# Patient Record
Sex: Female | Born: 1935 | Race: White | Hispanic: No | Smoking: Never smoker
Health system: Southern US, Community
[De-identification: ages and names within clinical notes are randomized; demographics above are authoritative.]

## PROBLEM LIST (undated history)

## (undated) DIAGNOSIS — N390 Urinary tract infection, site not specified: Secondary | ICD-10-CM

## (undated) DIAGNOSIS — N189 Chronic kidney disease, unspecified: Secondary | ICD-10-CM

## (undated) DIAGNOSIS — I4891 Unspecified atrial fibrillation: Secondary | ICD-10-CM

## (undated) DIAGNOSIS — IMO0002 Reserved for concepts with insufficient information to code with codable children: Secondary | ICD-10-CM

## (undated) DIAGNOSIS — K219 Gastro-esophageal reflux disease without esophagitis: Secondary | ICD-10-CM

## (undated) DIAGNOSIS — B9620 Unspecified Escherichia coli [E. coli] as the cause of diseases classified elsewhere: Secondary | ICD-10-CM

## (undated) DIAGNOSIS — J449 Chronic obstructive pulmonary disease, unspecified: Secondary | ICD-10-CM

## (undated) DIAGNOSIS — I10 Essential (primary) hypertension: Secondary | ICD-10-CM

## (undated) HISTORY — PX: CHOLECYSTECTOMY: SHX55

## (undated) HISTORY — DX: Unspecified Escherichia coli (E. coli) as the cause of diseases classified elsewhere: B96.20

## (undated) HISTORY — DX: Essential (primary) hypertension: I10

## (undated) HISTORY — PX: NASAL SINUS SURGERY: SHX719

## (undated) HISTORY — DX: Chronic kidney disease, unspecified: N18.9

## (undated) HISTORY — DX: Urinary tract infection, site not specified: N39.0

---

## 1998-08-18 ENCOUNTER — Other Ambulatory Visit: Admission: RE | Admit: 1998-08-18 | Discharge: 1998-08-18 | Payer: Self-pay | Admitting: Geriatric Medicine

## 1998-12-04 ENCOUNTER — Encounter: Admission: RE | Admit: 1998-12-04 | Discharge: 1998-12-04 | Payer: Self-pay | Admitting: Geriatric Medicine

## 1998-12-04 ENCOUNTER — Encounter: Payer: Self-pay | Admitting: Geriatric Medicine

## 2002-03-11 ENCOUNTER — Other Ambulatory Visit: Admission: RE | Admit: 2002-03-11 | Discharge: 2002-03-11 | Payer: Self-pay | Admitting: Geriatric Medicine

## 2003-02-05 ENCOUNTER — Emergency Department (HOSPITAL_COMMUNITY): Admission: AD | Admit: 2003-02-05 | Discharge: 2003-02-05 | Payer: Self-pay | Admitting: Family Medicine

## 2003-05-07 ENCOUNTER — Ambulatory Visit (HOSPITAL_COMMUNITY): Admission: RE | Admit: 2003-05-07 | Discharge: 2003-05-07 | Payer: Self-pay | Admitting: Gastroenterology

## 2003-05-07 ENCOUNTER — Encounter (INDEPENDENT_AMBULATORY_CARE_PROVIDER_SITE_OTHER): Payer: Self-pay | Admitting: Specialist

## 2004-05-10 ENCOUNTER — Encounter: Admission: RE | Admit: 2004-05-10 | Discharge: 2004-05-10 | Payer: Self-pay | Admitting: Geriatric Medicine

## 2006-03-10 ENCOUNTER — Other Ambulatory Visit: Admission: RE | Admit: 2006-03-10 | Discharge: 2006-03-10 | Payer: Self-pay | Admitting: Geriatric Medicine

## 2008-02-05 ENCOUNTER — Encounter: Admission: RE | Admit: 2008-02-05 | Discharge: 2008-02-05 | Payer: Self-pay | Admitting: Geriatric Medicine

## 2008-06-02 ENCOUNTER — Encounter: Admission: RE | Admit: 2008-06-02 | Discharge: 2008-06-02 | Payer: Self-pay | Admitting: Urology

## 2008-07-11 ENCOUNTER — Encounter: Admission: RE | Admit: 2008-07-11 | Discharge: 2008-07-11 | Payer: Self-pay | Admitting: Neurosurgery

## 2008-12-09 ENCOUNTER — Inpatient Hospital Stay (HOSPITAL_COMMUNITY): Admission: EM | Admit: 2008-12-09 | Discharge: 2008-12-12 | Payer: Self-pay | Admitting: Emergency Medicine

## 2008-12-20 ENCOUNTER — Inpatient Hospital Stay (HOSPITAL_COMMUNITY): Admission: EM | Admit: 2008-12-20 | Discharge: 2008-12-29 | Payer: Self-pay | Admitting: Emergency Medicine

## 2009-01-11 ENCOUNTER — Emergency Department (HOSPITAL_COMMUNITY): Admission: EM | Admit: 2009-01-11 | Discharge: 2009-01-11 | Payer: Self-pay | Admitting: Emergency Medicine

## 2010-02-21 ENCOUNTER — Encounter: Payer: Self-pay | Admitting: Geriatric Medicine

## 2010-02-22 ENCOUNTER — Encounter: Payer: Self-pay | Admitting: Neurosurgery

## 2010-05-04 LAB — BASIC METABOLIC PANEL
BUN: 16 mg/dL (ref 6–23)
CO2: 26 mEq/L (ref 19–32)
Creatinine, Ser: 0.7 mg/dL (ref 0.4–1.2)
GFR calc non Af Amer: >60 mL/min (ref >60)
Glucose, Bld: 110 mg/dL — ABNORMAL HIGH (ref 70–99)
Sodium: 139 mEq/L (ref 135–145)

## 2010-05-04 LAB — DIFFERENTIAL
Basophils Absolute: 0 10*3/uL (ref 0.0–0.1)
Eosinophils Absolute: 0.9 10*3/uL — ABNORMAL HIGH (ref 0.0–0.7)
Eosinophils Relative: 11 % — ABNORMAL HIGH (ref 0–5)
Lymphocytes Relative: 27 % (ref 12–46)
Lymphs Abs: 2 10*3/uL (ref 0.7–4.0)
Monocytes Relative: 8 % (ref 3–12)
Neutro Abs: 4.2 10*3/uL (ref 1.7–7.7)
Neutrophils Relative %: 54 % (ref 43–77)

## 2010-05-04 LAB — CBC: MCV: 82.3 fL (ref 78.0–100.0)

## 2010-05-05 LAB — POCT I-STAT, CHEM 8
BUN: 13 mg/dL (ref 6–23)
Chloride: 104 mEq/L (ref 96–112)
Creatinine, Ser: 0.7 mg/dL (ref 0.4–1.2)
Glucose, Bld: 146 mg/dL — ABNORMAL HIGH (ref 70–99)
Potassium: 3.6 mEq/L (ref 3.5–5.1)
Sodium: 141 mEq/L (ref 135–145)

## 2010-05-05 LAB — URINE MICROSCOPIC-ADD ON

## 2010-05-05 LAB — DIFFERENTIAL
Basophils Absolute: 0 10*3/uL (ref 0.0–0.1)
Basophils Absolute: 0 10*3/uL (ref 0.0–0.1)
Basophils Relative: 0 % (ref 0–1)
Basophils Relative: 0 % (ref 0–1)
Eosinophils Absolute: 0.2 10*3/uL (ref 0.0–0.7)
Eosinophils Relative: 3 % (ref 0–5)
Eosinophils Relative: 2 % (ref 0–5)
Lymphocytes Relative: 16 % (ref 12–46)
Monocytes Absolute: 1 10*3/uL (ref 0.1–1.0)
Neutro Abs: 8.4 10*3/uL — ABNORMAL HIGH (ref 1.7–7.7)
Neutrophils Relative %: 78 % — ABNORMAL HIGH (ref 43–77)

## 2010-05-05 LAB — CBC
HCT: 29.8 % — ABNORMAL LOW (ref 36.0–46.0)
HCT: 31.7 % — ABNORMAL LOW (ref 36.0–46.0)
HCT: 36.5 % (ref 36.0–46.0)
HCT: 32.6 % — ABNORMAL LOW (ref 36.0–46.0)
Hemoglobin: 10 g/dL — ABNORMAL LOW (ref 12.0–15.0)
Hemoglobin: 11.4 g/dL — ABNORMAL LOW (ref 12.0–15.0)
Hemoglobin: 11.3 g/dL — ABNORMAL LOW (ref 12.0–15.0)
Hemoglobin: 12.4 g/dL (ref 12.0–15.0)
MCHC: 33.8 g/dL (ref 30.0–36.0)
MCHC: 33.8 g/dL (ref 30.0–36.0)
MCHC: 34.5 g/dL (ref 30.0–36.0)
MCHC: 34.3 g/dL (ref 30.0–36.0)
MCV: 83.7 fL (ref 78.0–100.0)
Platelets: 475 10*3/uL — ABNORMAL HIGH (ref 150–400)
Platelets: 493 10*3/uL — ABNORMAL HIGH (ref 150–400)
Platelets: 555 10*3/uL — ABNORMAL HIGH (ref 150–400)
Platelets: 557 10*3/uL — ABNORMAL HIGH (ref 150–400)
RBC: 4.07 MIL/uL (ref 3.87–5.11)
RBC: 4.28 MIL/uL (ref 3.87–5.11)
RDW: 14 % (ref 11.5–15.5)
RDW: 14.4 % (ref 11.5–15.5)
RDW: 13.9 % (ref 11.5–15.5)
RDW: 14.2 % (ref 11.5–15.5)
WBC: 10.8 10*3/uL — ABNORMAL HIGH (ref 4.0–10.5)
WBC: 7.1 10*3/uL (ref 4.0–10.5)
WBC: 7.3 10*3/uL (ref 4.0–10.5)
WBC: 7.4 10*3/uL (ref 4.0–10.5)
WBC: 8.4 10*3/uL (ref 4.0–10.5)

## 2010-05-05 LAB — URINE CULTURE
Colony Count: 60000
Colony Count: NO GROWTH
Colony Count: >=100000
Culture: NO GROWTH

## 2010-05-05 LAB — HEPARIN LEVEL (UNFRACTIONATED)
Heparin Unfractionated: 0.28 IU/mL — ABNORMAL LOW (ref 0.30–0.70)
Heparin Unfractionated: 0.33 IU/mL (ref 0.30–0.70)
Heparin Unfractionated: 0.36 IU/mL (ref 0.30–0.70)
Heparin Unfractionated: 0.39 IU/mL (ref 0.30–0.70)
Heparin Unfractionated: 0.74 IU/mL — ABNORMAL HIGH (ref 0.30–0.70)

## 2010-05-05 LAB — COMPREHENSIVE METABOLIC PANEL
AST: 13 U/L (ref 0–37)
Albumin: 2.5 g/dL — ABNORMAL LOW (ref 3.5–5.2)
Alkaline Phosphatase: 67 U/L (ref 39–117)
BUN: 14 mg/dL (ref 6–23)
CO2: 27 mEq/L (ref 19–32)
Chloride: 100 mEq/L (ref 96–112)
GFR calc Af Amer: >60 mL/min (ref >60)
GFR calc non Af Amer: >60 mL/min (ref >60)
Potassium: 3.3 mEq/L — ABNORMAL LOW (ref 3.5–5.1)
Total Bilirubin: 0.7 mg/dL (ref 0.3–1.2)

## 2010-05-05 LAB — CK TOTAL AND CKMB (NOT AT ARMC)
CK, MB: 0.8 ng/mL (ref 0.3–4.0)
CK, MB: 0.9 ng/mL (ref 0.3–4.0)
Relative Index: INVALID (ref 0.0–2.5)
Relative Index: INVALID (ref 0.0–2.5)
Total CK: 40 U/L (ref 7–177)

## 2010-05-05 LAB — BASIC METABOLIC PANEL
BUN: 5 mg/dL — ABNORMAL LOW (ref 6–23)
BUN: 7 mg/dL (ref 6–23)
BUN: 8 mg/dL (ref 6–23)
CO2: 30 mEq/L (ref 19–32)
CO2: 30 mEq/L (ref 19–32)
Calcium: 8.4 mg/dL (ref 8.4–10.5)
Calcium: 8.4 mg/dL (ref 8.4–10.5)
Creatinine, Ser: 0.51 mg/dL (ref 0.4–1.2)
Creatinine, Ser: 0.67 mg/dL (ref 0.4–1.2)
Creatinine, Ser: 0.71 mg/dL (ref 0.4–1.2)
Creatinine, Ser: 0.75 mg/dL (ref 0.4–1.2)
GFR calc Af Amer: >60 mL/min (ref >60)
GFR calc Af Amer: >60 mL/min (ref >60)
GFR calc Af Amer: >60 mL/min (ref >60)
GFR calc non Af Amer: >60 mL/min (ref >60)
GFR calc non Af Amer: >60 mL/min (ref >60)
GFR calc non Af Amer: >60 mL/min (ref >60)
GFR calc non Af Amer: >60 mL/min (ref >60)
Glucose, Bld: 116 mg/dL — ABNORMAL HIGH (ref 70–99)
Glucose, Bld: 133 mg/dL — ABNORMAL HIGH (ref 70–99)
Potassium: 3.1 mEq/L — ABNORMAL LOW (ref 3.5–5.1)
Potassium: 3.8 mEq/L (ref 3.5–5.1)
Sodium: 143 mEq/L (ref 135–145)
Sodium: 137 mEq/L (ref 135–145)

## 2010-05-05 LAB — URINALYSIS, ROUTINE W REFLEX MICROSCOPIC
Bilirubin Urine: NEGATIVE
Bilirubin Urine: NEGATIVE
Glucose, UA: NEGATIVE mg/dL
Hgb urine dipstick: NEGATIVE
Ketones, ur: NEGATIVE mg/dL
Nitrite: NEGATIVE
Nitrite: NEGATIVE
Protein, ur: 30 mg/dL — AB
Specific Gravity, Urine: 1.017 (ref 1.005–1.030)
Specific Gravity, Urine: 1.026 (ref 1.005–1.030)
Urobilinogen, UA: 0.2 mg/dL (ref 0.0–1.0)
pH: 5.5 (ref 5.0–8.0)
pH: 5.5 (ref 5.0–8.0)

## 2010-05-05 LAB — IRON AND TIBC
Iron: 48 ug/dL (ref 42–135)
TIBC: 267 ug/dL (ref 250–470)
UIBC: 219 ug/dL

## 2010-05-05 LAB — PROTIME-INR
INR: 1.22 (ref 0.00–1.49)
INR: 1.27 (ref 0.00–1.49)
Prothrombin Time: 15.3 seconds — ABNORMAL HIGH (ref 11.6–15.2)
Prothrombin Time: 15.8 seconds — ABNORMAL HIGH (ref 11.6–15.2)
Prothrombin Time: 14.6 seconds (ref 11.6–15.2)

## 2010-05-05 LAB — CULTURE, BLOOD (ROUTINE X 2)

## 2010-05-05 LAB — FERRITIN: Ferritin: 228 ng/mL (ref 10–291)

## 2010-05-05 LAB — TSH: TSH: 0.722 u[IU]/mL (ref 0.350–4.500)

## 2010-05-05 LAB — FOLATE RBC: RBC Folate: 757 ng/mL — ABNORMAL HIGH (ref 180–600)

## 2010-05-05 LAB — GLUCOSE, CAPILLARY: Glucose-Capillary: 117 mg/dL — ABNORMAL HIGH (ref 70–99)

## 2010-05-05 LAB — TROPONIN I: Troponin I: 0.01 ng/mL (ref 0.00–0.06)

## 2010-06-18 NOTE — Op Note (Signed)
NAME:  [name] [name], [name] [name]                          ACCOUNT NO.:  [id number]   MEDICAL RECORD NO.:  [id number]                   PATIENT TYPE:  AMB   LOCATION:  ENDO                                 FACILITY:  [facility]   PHYSICIAN:  Danise Edge, M.D.                DATE OF BIRTH:  09/23/1926   DATE OF PROCEDURE:  05/07/2003  DATE OF DISCHARGE:                                 OPERATIVE REPORT   PROCEDURE:  Esophagogastroduodenoscopy and colonoscopy.   INDICATIONS FOR PROCEDURE:  [name] [name] is a 77 year old female with  chronic gastroesophageal reflux disease.  She takes nonsteroidal  antiinflammatory medication.  She has nausea, vomiting and unexplained  diarrhea. She has a history of colon polyps.   ENDOSCOPIST:  Danise Edge, M.D.   PREMEDICATION:  Versed 5 mg, Demerol 50 mg.   PROCEDURE:  Esophagogastroduodenoscopy.   DESCRIPTION OF PROCEDURE:  After obtaining informed consent, [name] [name] was  placed in the left lateral decubitus position. I administered intravenous  Demerol and intravenous Versed to achieve conscious sedation for the  procedure. The patient's blood pressure, oxygen saturation and cardiac  rhythm were monitored throughout the procedure and documented in the medical  record.   The Olympus gastroscope was passed through the posterior hypopharynx into  the proximal esophagus without difficulty. The hypopharynx, larynx and vocal  cords appeared normal.   ESOPHAGOSCOPY:  The proximal and mid segments of the esophagus appeared  normal. There is mucosal scarring in the distal esophagus associated with  multiple linear erosions.   GASTROSCOPY:  There is a small hiatal hernia.  Retroflexed view of the  gastric cardia and fundus was normal. The diaphragmatic hiatus is patulous.  The gastric body, antrum and pylorus appear normal.   DUODENOSCOPY:  The duodenal bulb and mid duodenum appear normal.   ASSESSMENT:  Chronic gastroesophageal reflux disease associated  with  erosions in the distal esophagus and distal esophageal mucosa scarring.   PROCEDURE:  Proctocolonoscopy to the cecum.   DESCRIPTION OF PROCEDURE:  Anal inspection was normal. Digital rectal exam  was normal. The Olympus adjustable pediatric colonoscope was introduced into  the rectum and advanced to the cecum. Colonic preparation for the exam today  was fair at best. [name] [name] does have some difficulty in holding air making  colonic insufflation somewhat difficult.   RECTUM:  Normal.   SIGMOID COLON AND DESCENDING COLON:  Normal.   SPLENIC FLEXURE:  Normal.   TRANSVERSE COLON:  Normal.   HEPATIC FLEXURE:  Normal.   ASCENDING COLON:  Normal.   CECUM AND ILEOCECAL VALVE:  Normal.   RANDOM COLON BIOPSY:  Three biopsies were taken from the right colon and two  biopsies were taken from the left colon to rule out microscopic-collagenous  colitis.  Danise Edge, M.D.    MJ/MEDQ  D:  05/07/2003  T:  05/07/2003  Job:  [id number]

## 2010-08-13 ENCOUNTER — Other Ambulatory Visit: Payer: Self-pay | Admitting: Geriatric Medicine

## 2010-08-13 DIAGNOSIS — R109 Unspecified abdominal pain: Secondary | ICD-10-CM

## 2010-08-17 ENCOUNTER — Ambulatory Visit
Admission: RE | Admit: 2010-08-17 | Discharge: 2010-08-17 | Disposition: A | Discharge status: Home / Self Care | Payer: Medicare Other | Source: Ambulatory Visit | Attending: Geriatric Medicine | Admitting: Geriatric Medicine

## 2010-08-17 DIAGNOSIS — R109 Unspecified abdominal pain: Secondary | ICD-10-CM

## 2010-09-17 ENCOUNTER — Emergency Department (HOSPITAL_COMMUNITY): Payer: Medicare Other

## 2010-09-17 ENCOUNTER — Encounter (HOSPITAL_COMMUNITY): Payer: Self-pay | Admitting: Radiology

## 2010-09-17 ENCOUNTER — Inpatient Hospital Stay (HOSPITAL_COMMUNITY)
Admission: EM | Admit: 2010-09-17 | Discharge: 2010-09-24 | DRG: 417 | Disposition: A | Discharge status: Home Health | Payer: Medicare Other | Attending: Family Medicine | Admitting: Family Medicine

## 2010-09-17 DIAGNOSIS — K859 Acute pancreatitis without necrosis or infection, unspecified: Principal | ICD-10-CM | POA: Diagnosis present

## 2010-09-17 DIAGNOSIS — Z79899 Other long term (current) drug therapy: Secondary | ICD-10-CM

## 2010-09-17 DIAGNOSIS — K8051 Calculus of bile duct without cholangitis or cholecystitis with obstruction: Secondary | ICD-10-CM | POA: Diagnosis present

## 2010-09-17 DIAGNOSIS — E876 Hypokalemia: Secondary | ICD-10-CM | POA: Diagnosis present

## 2010-09-17 DIAGNOSIS — J690 Pneumonitis due to inhalation of food and vomit: Secondary | ICD-10-CM | POA: Diagnosis not present

## 2010-09-17 DIAGNOSIS — K838 Other specified diseases of biliary tract: Secondary | ICD-10-CM | POA: Diagnosis present

## 2010-09-17 DIAGNOSIS — J441 Chronic obstructive pulmonary disease with (acute) exacerbation: Secondary | ICD-10-CM | POA: Diagnosis present

## 2010-09-17 DIAGNOSIS — J9600 Acute respiratory failure, unspecified whether with hypoxia or hypercapnia: Secondary | ICD-10-CM | POA: Diagnosis not present

## 2010-09-17 DIAGNOSIS — M8448XA Pathological fracture, other site, initial encounter for fracture: Secondary | ICD-10-CM | POA: Diagnosis present

## 2010-09-17 DIAGNOSIS — K219 Gastro-esophageal reflux disease without esophagitis: Secondary | ICD-10-CM | POA: Diagnosis present

## 2010-09-17 DIAGNOSIS — N390 Urinary tract infection, site not specified: Secondary | ICD-10-CM | POA: Diagnosis present

## 2010-09-17 DIAGNOSIS — R5381 Other malaise: Secondary | ICD-10-CM | POA: Diagnosis present

## 2010-09-17 DIAGNOSIS — F0390 Unspecified dementia without behavioral disturbance: Secondary | ICD-10-CM | POA: Diagnosis present

## 2010-09-17 DIAGNOSIS — I4891 Unspecified atrial fibrillation: Secondary | ICD-10-CM | POA: Diagnosis not present

## 2010-09-17 DIAGNOSIS — Z86711 Personal history of pulmonary embolism: Secondary | ICD-10-CM

## 2010-09-17 LAB — DIFFERENTIAL
Lymphocytes Relative: 8 % — ABNORMAL LOW (ref 12–46)
Lymphs Abs: 0.9 10*3/uL (ref 0.7–4.0)
Monocytes Relative: 6 % (ref 3–12)
Neutrophils Relative %: 86 % — ABNORMAL HIGH (ref 43–77)

## 2010-09-17 LAB — URINALYSIS, ROUTINE W REFLEX MICROSCOPIC
Ketones, ur: 15 mg/dL — AB
Specific Gravity, Urine: 1.026 (ref 1.005–1.030)
Urobilinogen, UA: 2 mg/dL — ABNORMAL HIGH (ref 0.0–1.0)

## 2010-09-17 LAB — CBC
HCT: 39.8 % (ref 36.0–46.0)
MCH: 28.3 pg (ref 26.0–34.0)
MCV: 81.6 fL (ref 78.0–100.0)
RBC: 4.88 MIL/uL (ref 3.87–5.11)
WBC: 12.5 10*3/uL — ABNORMAL HIGH (ref 4.0–10.5)

## 2010-09-17 LAB — POCT I-STAT TROPONIN I: Troponin i, poc: 0 ng/mL (ref 0.00–0.08)

## 2010-09-17 LAB — HEPATIC FUNCTION PANEL
ALT: 179 U/L — ABNORMAL HIGH (ref 0–35)
AST: 203 U/L — ABNORMAL HIGH (ref 0–37)
Alkaline Phosphatase: 215 U/L — ABNORMAL HIGH (ref 39–117)
Bilirubin, Direct: 1.7 mg/dL — ABNORMAL HIGH (ref 0.0–0.3)
Indirect Bilirubin: 0.8 mg/dL (ref 0.3–0.9)
Total Bilirubin: 2.5 mg/dL — ABNORMAL HIGH (ref 0.3–1.2)

## 2010-09-17 LAB — POCT I-STAT, CHEM 8
Calcium, Ion: 1.06 mmol/L — ABNORMAL LOW (ref 1.12–1.32)
Creatinine, Ser: 0.8 mg/dL (ref 0.50–1.10)
Glucose, Bld: 168 mg/dL — ABNORMAL HIGH (ref 70–99)
Hemoglobin: 13.9 g/dL (ref 12.0–15.0)
TCO2: 32 mmol/L (ref 0–100)

## 2010-09-17 LAB — URINE MICROSCOPIC-ADD ON

## 2010-09-17 MED ORDER — IOHEXOL 300 MG/ML  SOLN
80.0000 mL | Freq: Once | INTRAMUSCULAR | Status: AC | PRN
  Administered 2010-09-17: 80 mL via INTRAVENOUS

## 2010-09-18 DIAGNOSIS — K859 Acute pancreatitis without necrosis or infection, unspecified: Secondary | ICD-10-CM

## 2010-09-18 DIAGNOSIS — K8020 Calculus of gallbladder without cholecystitis without obstruction: Secondary | ICD-10-CM

## 2010-09-18 LAB — GLUCOSE, CAPILLARY
Glucose-Capillary: 105 mg/dL — ABNORMAL HIGH (ref 70–99)
Glucose-Capillary: 106 mg/dL — ABNORMAL HIGH (ref 70–99)
Glucose-Capillary: 140 mg/dL — ABNORMAL HIGH (ref 70–99)

## 2010-09-18 LAB — DIFFERENTIAL
Basophils Absolute: 0 10*3/uL (ref 0.0–0.1)
Basophils Relative: 0 % (ref 0–1)
Monocytes Relative: 8 % (ref 3–12)
Neutro Abs: 6.2 10*3/uL (ref 1.7–7.7)
Neutrophils Relative %: 73 % (ref 43–77)

## 2010-09-18 LAB — TSH: TSH: 2.983 u[IU]/mL (ref 0.350–4.500)

## 2010-09-18 LAB — BASIC METABOLIC PANEL
CO2: 30 mEq/L (ref 19–32)
Calcium: 8.5 mg/dL (ref 8.4–10.5)
Creatinine, Ser: 0.62 mg/dL (ref 0.50–1.10)
Glucose, Bld: 103 mg/dL — ABNORMAL HIGH (ref 70–99)

## 2010-09-18 LAB — HEPATIC FUNCTION PANEL
ALT: 161 U/L — ABNORMAL HIGH (ref 0–35)
Albumin: 2.5 g/dL — ABNORMAL LOW (ref 3.5–5.2)
Alkaline Phosphatase: 198 U/L — ABNORMAL HIGH (ref 39–117)
Total Protein: 6.4 g/dL (ref 6.0–8.3)

## 2010-09-18 LAB — CK TOTAL AND CKMB (NOT AT ARMC): Total CK: 44 U/L (ref 7–177)

## 2010-09-18 LAB — PROTIME-INR: Prothrombin Time: 13.6 seconds (ref 11.6–15.2)

## 2010-09-18 LAB — CARDIAC PANEL(CRET KIN+CKTOT+MB+TROPI)
Relative Index: INVALID (ref 0.0–2.5)
Total CK: 44 U/L (ref 7–177)
Troponin I: <0.3 ng/mL (ref <0.30)

## 2010-09-18 LAB — CBC
Hemoglobin: 12 g/dL (ref 12.0–15.0)
RBC: 4.28 MIL/uL (ref 3.87–5.11)

## 2010-09-19 ENCOUNTER — Other Ambulatory Visit (INDEPENDENT_AMBULATORY_CARE_PROVIDER_SITE_OTHER): Payer: Self-pay | Admitting: Surgery

## 2010-09-19 ENCOUNTER — Inpatient Hospital Stay (HOSPITAL_COMMUNITY): Payer: Medicare Other

## 2010-09-19 DIAGNOSIS — K812 Acute cholecystitis with chronic cholecystitis: Secondary | ICD-10-CM

## 2010-09-19 LAB — SURGICAL PCR SCREEN
MRSA, PCR: NEGATIVE
Staphylococcus aureus: NEGATIVE

## 2010-09-19 LAB — CBC
MCH: 28.2 pg (ref 26.0–34.0)
MCV: 82.5 fL (ref 78.0–100.0)
Platelets: 307 10*3/uL (ref 150–400)
RDW: 13.9 % (ref 11.5–15.5)
WBC: 9.2 10*3/uL (ref 4.0–10.5)

## 2010-09-19 LAB — COMPREHENSIVE METABOLIC PANEL
AST: 156 U/L — ABNORMAL HIGH (ref 0–37)
Albumin: 2.9 g/dL — ABNORMAL LOW (ref 3.5–5.2)
Calcium: 8.6 mg/dL (ref 8.4–10.5)
Creatinine, Ser: 0.65 mg/dL (ref 0.50–1.10)

## 2010-09-19 LAB — GLUCOSE, CAPILLARY: Glucose-Capillary: 99 mg/dL (ref 70–99)

## 2010-09-19 LAB — CARDIAC PANEL(CRET KIN+CKTOT+MB+TROPI)
CK, MB: 2.6 ng/mL (ref 0.3–4.0)
Total CK: 82 U/L (ref 7–177)

## 2010-09-19 LAB — POTASSIUM: Potassium: 3.1 mEq/L — ABNORMAL LOW (ref 3.5–5.1)

## 2010-09-19 NOTE — Op Note (Signed)
NAMEREHAM, SLABAUGH                ACCOUNT NO.:  [id number]  MEDICAL RECORD NO.:  [id number]  LOCATION:                                 FACILITY:  PHYSICIAN:  Abigail Miyamoto, M.D. DATE OF BIRTH:  1926-12-11  DATE OF PROCEDURE:  09/19/2010 DATE OF DISCHARGE:                              OPERATIVE REPORT   PREOPERATIVE DIAGNOSIS:  Gallstone pancreatitis.  POSTOPERATIVE DIAGNOSIS:  Gallstone pancreatitis with choledocholithiasis.  PROCEDURE:  Laparoscopic cholecystectomy with intraoperative cholangiogram.  SURGEON:  Abigail Miyamoto, MD  ANESTHESIA:  General and 0.25% Marcaine.  ESTIMATED BLOOD LOSS:  Minimal.  FINDINGS:  The patient had a gallbladder full of gallstones. Cholangiogram showed a dilated bile duct with obstructing common bile duct stone at the ampulla.  PROCEDURE IN DETAIL:  The patient was brought to the operating room, identified as [name] [name].  She was placed supine on the operating room table, and general anesthesia was induced.  Her abdomen was then prepped and draped in the usual sterile fashion.  Using a 15 blade, a small vertical incision was made below the umbilicus.  This was carried down the fascia which was then opened with a scalpel.  A hemostat was then used to pass the peritoneal cavity under direct vision.  A 0 Vicryl pursestring suture was then placed around the fascial opening.  The Hasson port was placed in the opening and insufflation of the abdomen was begun.  A 5-mm port was then placed in the patient's epigastrium and two more were placed in the patient's in the right upper quadrant under direct vision.  The gallbladder was found to be distended.  It was aspirated of bile prior to retracting above the liver.  The cystic duct was easily dissected out and critical window was achieved around it.  It was then clipped once distally and opened with laparoscopic scissors. The cholangiocatheter was inserted in the right upper quadrant  under direct vision, was placed into the opening of the cystic duct.  A cholangiogram was then performed with contrast.  This delineated the dilated bile duct and demonstrated obstructing stone at the distal common bile duct.  At this point, the cholangiocatheter was removed and clipped the cyst duct three times proximally and transected it.  The cystic artery and posterior branch were then identified, clipped proximally and distally and transected as well.  The gallbladder was then slowly dissected free from the liver bed with electrocautery.  Once this was freed from the liver bed was placed in an endosac and removed through the incision at the umbilicus as well as several stones.  I then thoroughly irrigated the abdomen with normal saline.  I placed a piece of fibrillar in the gallbladder bed, hemostasis appeared to be achieved. After irrigating the abdomen further, all ports were removed under direct vision and the abdomen was deflated, hemostasis appeared to be achieved.  A 0 Vicryl at the umbilicus was tied in place closing the fascial defect.  All port sites were then anesthetized with Marcaine and closed with 4-0 Monocryl subcuticular sutures. Steri-Strips and Band-Aids were then applied.  The patient tolerated the procedure well.  All counts were correct at the end of procedure.  The patient was then extubated in operating room and taken in stable condition to recovery room.     Abigail Miyamoto, M.D.     DB/MEDQ  D:  09/19/2010  T:  09/19/2010  Job:  [id number]  Electronically Signed by Abigail Miyamoto M.D. on 09/19/2010 05:41:14 PM

## 2010-09-19 NOTE — Consult Note (Signed)
[name] [name], [name] [name]                ACCOUNT NO.:  [id number]  MEDICAL RECORD NO.:  [id number]  LOCATION:  [facility]                         FACILITY:  [facility]  PHYSICIAN:  Abigail Miyamoto, M.D. DATE OF BIRTH:  [birth year]  DATE OF CONSULTATION:  09/18/2010 DATE OF DISCHARGE:                                CONSULTATION   REFERRING PHYSICIAN:  Eduard Clos, MD  CHIEF COMPLAINT:  Epigastric abdominal pain and gallstone pancreatitis.  HISTORY:  This is an 77 year old female who is being admitted by the Hospitalist Service.  She has had 1-day history of epigastric abdominal pain, nausea, and vomiting.  She was found to have elevated lipase, liver function test and a CAT scan showing findings consistent with a dilated bile duct and gallstones consistent with gallstone pancreatitis. Surgery has been consulted regarding this.  Currently, the patient reports her pain has decreased significantly.  She has had no previous history of gallbladder attacks.  The pain was described as sharp and moderate to severe at times.  PAST MEDICAL HISTORY: 1. COPD. 2. Dementia. 3. Multiple falls. 4. She has had a pulmonary embolism and now an IVC filter.  She denies any previous surgical history.  There is no family present to question her further. Some of the history is obtained by old records.  Her other past medical history includes previous history of urinary tract infections, depression, and known ovarian masses.  MEDICATIONS:  Please see orders for universal medical reconciliation form.  ALLERGIES:  No known drug allergies.  SOCIAL HISTORY:  She does not smoke, does not drink alcohol.  FAMILY HISTORY:  Significant for coronary artery disease and kidney failure.  REVIEW OF SYSTEMS:  Currently is negative for fevers or chills, is also negative for cough or difficulty breathing, is negative for chest pain or arrhythmias.  Rest of review of systems is unremarkable except for dementia,  chronic urinary tract infections.  PHYSICAL EXAMINATION:  GENERAL:  This is an elderly-appearing female in no acute distress.  She is awake, alert and oriented and comfortable. She is afebrile. VITAL SIGNS:  Stable. HEENT:  Eyes:  Anicteric.  Pupils reactive bilaterally.  ENT:  External ears and nose are normal.  Hearing is normal.  Oropharynx clear. NECK:  Supple.  Trachea midline.  There is no thyromegaly.  There is no JVD. LUNGS:  Clear to auscultation bilaterally with normal respiratory effort. CARDIOVASCULAR:  Regular rate and rhythm.  There are no murmurs.  There is no peripheral edema. ABDOMEN:  Currently is soft, nontender, nondistended.  There is very little guarding in the epigastrium.  There are no hernias.  There is no organomegaly. EXTREMITIES:  Warm and well perfused, no edema, clubbing, or cyanosis. Peripheral pulses are intact in all extremities.  Glasgow coma scale is 15. SKIN:  No rashes and no jaundice.  DATA REVIEWED:  The patient had a CAT scan of the abdomen and pelvis which shows her to have gallstones and distention of gallbladder.  The bile ducts are mildly dilated to 11 mm.  There is also some very mild intrahepatic biliary dilatation.  There are adnexal masses consistent with her known bilateral ovarian cysts.  She also has hepatic cysts. The  patient's white blood count is 12.5, hemoglobin is 13.8, bilirubin is 2.5, alkaline phosphatase 215, lipase is 265.  IMPRESSION:  This is an elderly female with gallstone pancreatitis and multiple medical problems.  At this point, she is being admitted to the hospital by the Hospitalist Service for further workup, IV antibiotics and IV fluid.  I suggest Gastroenterology to be consulted as the patient may need an ERCP if her liver function tests continue to increase.  If they decrease, she will be considered for a possible laparoscopic cholecystectomy, pending cardiopulmonary clearance.     Abigail Miyamoto,  M.D.     DB/MEDQ  D:  09/18/2010  T:  09/18/2010  Job:  [id number]  Electronically Signed by Abigail Miyamoto M.D. on 09/19/2010 05:41:11 PM

## 2010-09-20 ENCOUNTER — Inpatient Hospital Stay (HOSPITAL_COMMUNITY): Payer: Medicare Other

## 2010-09-20 DIAGNOSIS — K819 Cholecystitis, unspecified: Secondary | ICD-10-CM

## 2010-09-20 DIAGNOSIS — R0602 Shortness of breath: Secondary | ICD-10-CM

## 2010-09-20 DIAGNOSIS — I4891 Unspecified atrial fibrillation: Secondary | ICD-10-CM

## 2010-09-20 LAB — COMPREHENSIVE METABOLIC PANEL
Albumin: 2.2 g/dL — ABNORMAL LOW (ref 3.5–5.2)
Alkaline Phosphatase: 202 U/L — ABNORMAL HIGH (ref 39–117)
BUN: 12 mg/dL (ref 6–23)
Chloride: 103 mEq/L (ref 96–112)
Creatinine, Ser: 0.47 mg/dL — ABNORMAL LOW (ref 0.50–1.10)
GFR calc Af Amer: >60 mL/min (ref >60)
Glucose, Bld: 151 mg/dL — ABNORMAL HIGH (ref 70–99)
Potassium: 3.3 mEq/L — ABNORMAL LOW (ref 3.5–5.1)
Total Bilirubin: 4.9 mg/dL — ABNORMAL HIGH (ref 0.3–1.2)
Total Protein: 5.9 g/dL — ABNORMAL LOW (ref 6.0–8.3)

## 2010-09-20 LAB — URINE CULTURE: Colony Count: NO GROWTH

## 2010-09-20 LAB — AMYLASE: Amylase: 67 U/L (ref 0–105)

## 2010-09-20 LAB — URINALYSIS, ROUTINE W REFLEX MICROSCOPIC
Glucose, UA: NEGATIVE mg/dL
Nitrite: NEGATIVE
Specific Gravity, Urine: 1.023 (ref 1.005–1.030)
pH: 5.5 (ref 5.0–8.0)

## 2010-09-20 LAB — URINE MICROSCOPIC-ADD ON

## 2010-09-20 LAB — LIPASE, BLOOD: Lipase: 11 U/L (ref 11–59)

## 2010-09-20 LAB — BLOOD GAS, ARTERIAL
Bicarbonate: 28.3 mEq/L — ABNORMAL HIGH (ref 20.0–24.0)
Drawn by: 332341
O2 Content: 2 L/min
pCO2 arterial: 47.2 mmHg — ABNORMAL HIGH (ref 35.0–45.0)
pO2, Arterial: 50.8 mmHg — ABNORMAL LOW (ref 80.0–100.0)

## 2010-09-20 LAB — CBC
MCHC: 32.9 g/dL (ref 30.0–36.0)
Platelets: 298 10*3/uL (ref 150–400)
RDW: 14.3 % (ref 11.5–15.5)
WBC: 10.1 10*3/uL (ref 4.0–10.5)

## 2010-09-20 LAB — CARDIAC PANEL(CRET KIN+CKTOT+MB+TROPI)
CK, MB: 2.5 ng/mL (ref 0.3–4.0)
CK, MB: 2.9 ng/mL (ref 0.3–4.0)
Relative Index: INVALID (ref 0.0–2.5)
Total CK: 90 U/L (ref 7–177)
Troponin I: <0.3 ng/mL (ref <0.30)

## 2010-09-20 LAB — BASIC METABOLIC PANEL
CO2: 28 mEq/L (ref 19–32)
Chloride: 103 mEq/L (ref 96–112)
Creatinine, Ser: <0.47 mg/dL — ABNORMAL LOW (ref 0.50–1.10)
Glucose, Bld: 157 mg/dL — ABNORMAL HIGH (ref 70–99)
Sodium: 138 mEq/L (ref 135–145)

## 2010-09-20 LAB — TYPE AND SCREEN
ABO/RH(D): A POS
Antibody Screen: NEGATIVE

## 2010-09-20 LAB — MAGNESIUM: Magnesium: 1.9 mg/dL (ref 1.5–2.5)

## 2010-09-20 LAB — POTASSIUM: Potassium: 3.6 mEq/L (ref 3.5–5.1)

## 2010-09-21 ENCOUNTER — Inpatient Hospital Stay (HOSPITAL_COMMUNITY): Payer: Medicare Other

## 2010-09-21 DIAGNOSIS — R0602 Shortness of breath: Secondary | ICD-10-CM

## 2010-09-21 DIAGNOSIS — I4891 Unspecified atrial fibrillation: Secondary | ICD-10-CM

## 2010-09-21 DIAGNOSIS — K819 Cholecystitis, unspecified: Secondary | ICD-10-CM

## 2010-09-21 LAB — CBC
HCT: 33.3 % — ABNORMAL LOW (ref 36.0–46.0)
Hemoglobin: 11.1 g/dL — ABNORMAL LOW (ref 12.0–15.0)
MCH: 27.5 pg (ref 26.0–34.0)
MCHC: 33.3 g/dL (ref 30.0–36.0)
MCV: 82.6 fL (ref 78.0–100.0)

## 2010-09-21 LAB — POCT I-STAT 3, ART BLOOD GAS (G3+)
Acid-Base Excess: 2 mmol/L (ref 0.0–2.0)
Bicarbonate: 27.5 mEq/L — ABNORMAL HIGH (ref 20.0–24.0)
O2 Saturation: 95 %
TCO2: 29 mmol/L (ref 0–100)
pO2, Arterial: 73 mmHg — ABNORMAL LOW (ref 80.0–100.0)

## 2010-09-21 LAB — COMPREHENSIVE METABOLIC PANEL
Alkaline Phosphatase: 206 U/L — ABNORMAL HIGH (ref 39–117)
BUN: 14 mg/dL (ref 6–23)
Creatinine, Ser: 0.61 mg/dL (ref 0.50–1.10)
GFR calc Af Amer: >60 mL/min (ref >60)
Glucose, Bld: 159 mg/dL — ABNORMAL HIGH (ref 70–99)
Potassium: 3.5 mEq/L (ref 3.5–5.1)
Total Protein: 5.8 g/dL — ABNORMAL LOW (ref 6.0–8.3)

## 2010-09-21 LAB — GLUCOSE, CAPILLARY: Glucose-Capillary: 203 mg/dL — ABNORMAL HIGH (ref 70–99)

## 2010-09-22 ENCOUNTER — Inpatient Hospital Stay (HOSPITAL_COMMUNITY): Payer: Medicare Other

## 2010-09-22 LAB — POCT I-STAT 3, ART BLOOD GAS (G3+)
Bicarbonate: 27.2 mEq/L — ABNORMAL HIGH (ref 20.0–24.0)
TCO2: 28 mmol/L (ref 0–100)
pCO2 arterial: 39.2 mmHg (ref 35.0–45.0)
pH, Arterial: 7.449 — ABNORMAL HIGH (ref 7.350–7.400)
pO2, Arterial: 56 mmHg — ABNORMAL LOW (ref 80.0–100.0)

## 2010-09-22 LAB — COMPREHENSIVE METABOLIC PANEL
AST: 15 U/L (ref 0–37)
Albumin: 1.8 g/dL — ABNORMAL LOW (ref 3.5–5.2)
Calcium: 8.3 mg/dL — ABNORMAL LOW (ref 8.4–10.5)
Chloride: 106 mEq/L (ref 96–112)
Creatinine, Ser: 0.59 mg/dL (ref 0.50–1.10)

## 2010-09-22 LAB — CBC
MCH: 27.3 pg (ref 26.0–34.0)
MCV: 82 fL (ref 78.0–100.0)
Platelets: 330 10*3/uL (ref 150–400)
RDW: 14.6 % (ref 11.5–15.5)
WBC: 15.1 10*3/uL — ABNORMAL HIGH (ref 4.0–10.5)

## 2010-09-22 LAB — GLUCOSE, CAPILLARY
Glucose-Capillary: 149 mg/dL — ABNORMAL HIGH (ref 70–99)
Glucose-Capillary: 173 mg/dL — ABNORMAL HIGH (ref 70–99)

## 2010-09-23 ENCOUNTER — Inpatient Hospital Stay (HOSPITAL_COMMUNITY): Payer: Medicare Other

## 2010-09-23 LAB — GLUCOSE, CAPILLARY
Glucose-Capillary: 106 mg/dL — ABNORMAL HIGH (ref 70–99)
Glucose-Capillary: 134 mg/dL — ABNORMAL HIGH (ref 70–99)
Glucose-Capillary: 134 mg/dL — ABNORMAL HIGH (ref 70–99)
Glucose-Capillary: 123 mg/dL — ABNORMAL HIGH (ref 70–99)

## 2010-09-23 LAB — BLOOD GAS, ARTERIAL
Bicarbonate: 32.3 mEq/L — ABNORMAL HIGH (ref 20.0–24.0)
Drawn by: 312881
O2 Content: 4 L/min
O2 Saturation: 95.8 %
Patient temperature: 98.6

## 2010-09-23 LAB — COMPREHENSIVE METABOLIC PANEL WITH GFR
ALT: 50 U/L — ABNORMAL HIGH (ref 0–35)
AST: 21 U/L (ref 0–37)
Albumin: 2.2 g/dL — ABNORMAL LOW (ref 3.5–5.2)
Alkaline Phosphatase: 150 U/L — ABNORMAL HIGH (ref 39–117)
BUN: 21 mg/dL (ref 6–23)
CO2: 32 meq/L (ref 19–32)
Calcium: 8.4 mg/dL (ref 8.4–10.5)
Chloride: 100 meq/L (ref 96–112)
Creatinine, Ser: 0.69 mg/dL (ref 0.50–1.10)
GFR calc Af Amer: >60 mL/min
GFR calc non Af Amer: >60 mL/min
Glucose, Bld: 106 mg/dL — ABNORMAL HIGH (ref 70–99)
Potassium: 3.5 meq/L (ref 3.5–5.1)
Sodium: 140 meq/L (ref 135–145)
Total Bilirubin: 0.8 mg/dL (ref 0.3–1.2)
Total Protein: 6.3 g/dL (ref 6.0–8.3)

## 2010-09-23 LAB — CBC
Hemoglobin: 12 g/dL (ref 12.0–15.0)
MCH: 27.3 pg (ref 26.0–34.0)
MCHC: 33.2 g/dL (ref 30.0–36.0)

## 2010-09-24 LAB — GLUCOSE, CAPILLARY: Glucose-Capillary: 118 mg/dL — ABNORMAL HIGH (ref 70–99)

## 2010-09-26 NOTE — Discharge Summary (Signed)
  NAMEEVANIA, [name] [name]                ACCOUNT NO.:  [id number]  MEDICAL RECORD NO.:  [id number]  LOCATION:  3703                         FACILITY:  [facility]  PHYSICIAN:  Tarry Kos, MD       DATE OF BIRTH:  August 11, 1926  DATE OF ADMISSION:  09/17/2010 DATE OF DISCHARGE:  09/24/2010                              DISCHARGE SUMMARY   DISCHARGE DIAGNOSES: 1. Gallstone pancreatitis status post cholecystectomy. 2. Aspiration pneumonitis. 3. Chronic obstructive pulmonary disease exacerbation. 4. Urinary tract infection. 5. Deconditioning. 6. Acute respiratory distress. 7. Atrial fibrillation.  HOSPITAL COURSE:  [name] [name] is a pleasant 77 year old female who presented to the emergency department on September 18, 2010 with abdominal pain.  She was found to have gallstone pancreatitis.  She had a laparoscopic cholecystectomy with intraoperative cholangiogram on September 19, 2010 by Dr. Magnus Ivan.  She had ERCP and sphincterotomy and stone extraction on September 20, 2010 by Dr. Ewing Schlein.  She has done well postoperatively, however, she did develop aspiration pneumonitis and some mild respiratory distress.  This was treated with broad-spectrum antibiotics and she has done much better with that.  She also was diagnosed with UTI.  Her urine culture was negative.  Over the last several days, she has been doing very well.  PHYSICAL EXAMINATION:  VITAL SIGNS:  She has been afebrile.  Vital signs have been stable. GENERAL:  She is alert and oriented x4.  No apparent distress, friendly. HEENT:  Extraocular movements are intact.  Pupils are equal, round, and reactive to light.  Oropharynx is clear.  Mucous membranes are moist. NECK:  No JVD, no carotid bruits. COR:  Regular rate and rhythm without murmurs, rubs, or gallops. CHEST:  Clear to auscultation bilaterally.  No wheezes or rhonchi. ABDOMEN:  Soft, nontender, and nondistended.  Positive bowel sounds.  No hepatosplenomegaly. EXTREMITIES:  No clubbing,  cyanosis, or edema. PSYCH:  Normal affect. NEURO:  No focal neurologic deficits.  DISCHARGE INSTRUCTIONS:  [name] [name] has been doing well with physical therapy.  We recommended for her to go to short-term rehab, however, her family did not wish to do that.  They want to take her home with home health PT instead, this is being arranged.  She will be discharged home with a followup with her primary care physician in 1-2 weeks and General Surgery in 2-4 weeks.  We will send her home to complete 7 more days of clindamycin to cover her for aspiration pneumonitis.  She is being discharged with the above followup again in the care of her son and daughter-in-law.  Please see discharge med rec sheet for full details of medications.          ______________________________ Tarry Kos, MD     RD/MEDQ  D:  09/24/2010  T:  09/24/2010  Job:  [id number]  Electronically Signed by Tarry Kos MD on 09/26/2010 02:09:41 PM

## 2010-10-05 NOTE — H&P (Signed)
NAME:  [name], [name]                ACCOUNT NO.:  [id number]  MEDICAL RECORD NO.:  [id number]  LOCATION:                                 FACILITY:  PHYSICIAN:  Eduard Clos, MDDATE OF BIRTH:  22-Jun-1926  DATE OF ADMISSION: DATE OF DISCHARGE:                             HISTORY & PHYSICAL   PRIMARY CARE PHYSICIAN:  Hal T. Stoneking, MD  CHIEF COMPLAINT:  Abdominal pain.  HISTORY OF PRESENTING ILLNESS:  This 77 year old female with known history of PE status post IVC filter placement and now presents secondary to multiple falls, was not on Coumadin, history of dementia, history of ovarian mass presented to the ER because of abdominal pain over the last 2 days.  The pain is mostly around the upper abdomen and periumbilical area and has been there for 2-3 days, and the patient states that she threw up yesterday at least twice.  Denies any blood in the vomitus.  The patient in addition also has pain in the back aroundthe spine area which has been constant and worsening.  The patient denies any fall.  Denies any fever or chills.  Denies any chest pain. Denies any shortness of breath.  Denies any dysuria, discharge, or diarrhea.  Denies any focal deficit.  Denies any headache or visual symptoms.  In the ER, the patient was found to have abnormal LFTs with increased bilirubin, alk phos, and lipase.  CAT scan of the abdomen and pelvis at this time shows cholelithiasis with increasing distention of the gallbladder and biliary dilatation since previous exam.  In addition also showed T12 compression fracture which is new compared to the old scan.  At this time, the patient will be admitted for pancreatitis, most likely gallstone pancreatitis.  PAST MEDICAL HISTORY: 1. History of PE in November 2010 with placement of an IVC filter, and     was felt not a candidate for Coumadin secondary to multiple falls. 2. History of dementia. 3. History of ovarian mass.  MEDICATIONS PRIOR TO  ADMISSION:  To be verified and include Aricept, Tramadol, and Norvasc.  SOCIAL HISTORY:  The patient's son lives with her.  Denies smoking cigarette, drinking alcohol, or using illegal drugs.  FAMILY HISTORY:  Father had coronary artery disease.  Mother died from renal failure.  REVIEW OF SYSTEMS:  As per the history presenting illness nothing else significant.  PHYSICAL EXAMINATION:  GENERAL:  The patient was examined at bedside, not in acute distress. VITAL SIGNS:  Blood pressure is 120/80, pulse is 89 per minute, temperature 99.3, respirations 18 per minute, O2 sat is 96%. HEENT:  Anicteric.  No pallor.  No discharge from ears, eyes, nose, or mouth. CHEST:  Bilateral air entry present.  No rhonchi, no crepitation. HEART:  S1 and S2 heard. ABDOMEN:  Soft, mild tenderness in the epigastric area.  No guarding or rigidity. BACK:  There is no tenderness on the back of her spine. CENTRAL NERVOUS SYSTEM:  Alert, awake, and oriented to time, place, and person, moves upper and lower extremities. EXTREMITIES:  Peripheral pulses felt.  No acute ischemic changes, cyanosis, or clubbing.  LABORATORY DATA:  Acute abdominal series shows nonobstructive bowel gas pattern, low  volumes with bibasilar atelectasis. CT abdomen and pelvis with contrast media shows hepatic cyst, cholelithiasis with increased distention of the gallbladder and biliary dilatation since previous exams.  Recommend correlation with LFTs, stable appearance of large left and small right cystic ovarian masses, minimal sigmoid diverticulosis without acute diverticulitis, extensive degenerative disk and facet disease changes of the spine with new compression deformity at T12, 50% anterior height loss. CBC:  WBCs 12.5, hemoglobin is 13.9, hematocrit is 41, platelets 280, neutrophils 86%.  PT and INR are 13.6 and 1.  Complete metabolic panel: Sodium 139, potassium 2.8, chloride 97, glucose 168, BUN 11, creatinine 0.8, total  bilirubin is 2.5, direct is 1.7, indirect is 0.8, alkaline phosphatase 215, AST 203, ALT 179, total protein 7.6, albumin 3.2, lipase 265.  CK is 44, MB is 1.8, troponin 0.  UA appears cloudy, glucose negative, bilirubin large, ketones 15, blood moderate, protein 30, urobilinogen is 2, nitrites positive, leukocytes small, wbc's 11-20, bacteria many.  ASSESSMENT: 1. Possible gallstone pancreatitis. 2. Cholelithiasis. 3. Urinary tract infection. 4. History of pulmonary embolism status post inferior vena cava filter     placement in 2010.  The patient was felt not to be a candidate for     Coumadin secondary to fall. 5. Bilateral ovarian masses, stable. 6. History of dementia. 7. Hypokalemia. 8. Obstructive jaundice.  PLAN: 1. At this time, I will admit the patient to telemetry. 2. For her pancreatitis, at this time most likely from gallstone     related.  The patient will be kept n.p.o.  I did discuss with Dr.     Magnus Ivan, surgeon on-call.  He will be seeing the patient and at     the same time recommended a GI consult which will be called in a.m.     We are going to repeat LFTs again in a.m.  For now, I am going to    keep the patient on empiric antibiotics, Zosyn.  In addition to     cover her UTI, we are also not getting urine cultures.  We will     hydrate the patient gently and replace the potassium. 3. The patient does have a T12 compression fracture which is new which     has to be addressed later once the patient's acute pancreatitis has     been addressed. 4. Further recommendation based on test order and clinic course and     consults recommendations.    Eduard Clos, MD    ANK/MEDQ  D:  09/18/2010  T:  09/18/2010  Job:  [id number]  Electronically Signed by Midge Minium MD on 10/05/2010 09:32:55 AM

## 2010-10-20 NOTE — Op Note (Signed)
  NAMETYANN, NIEHAUS                ACCOUNT NO.:  [id number]  MEDICAL RECORD NO.:  [id number]  LOCATION:  2927                         FACILITY:  [facility]  PHYSICIAN:  Petra Kuba, M.D.    DATE OF BIRTH:  Apr 19, 1926  DATE OF PROCEDURE:  09/20/2010 DATE OF DISCHARGE:                              OPERATIVE REPORT   PROCEDURE:  ERCP, sphincterotomy, and stone extraction.  INDICATIONS:  The patient with a positive intraop cholangiogram, elevated liver tests.  Consent was signed after risks, benefits, methods, options thoroughly discussed on multiple occasions with her son.  MEDICINES USED:  Fentanyl 50 mcg, Versed 3.5 mg.  PROCEDURE:  Side-viewing therapeutic video duodenoscope was inserted by indirect vision into the stomach and advanced through a normal antrum and pylorus into the duodenum, and a normal-appearing ampulla was brought into view.  Using the triple-lumen sphincterotome loaded with the Jagwire, deep selective cannulation was obtained on the first attempt.  The CBD was filled and probable two small stones were seen in the duct, which was slightly dilated.  The intrahepatics were not overfilled.  There were no PD injections and no PD wire advancements throughout the procedure.  We went ahead and proceeded with a medium- sized sphincterotomy in the customary fashion until adequate biliary drainage was obtained, and we could get the fully bowed sphincterotome in and out of the duct easily.  We then exchanged the sphincterotome for the adjustable 12-15 mm balloon then on the first attempt both stones were removed.  We proceeded with four more balloon pull throughs, which passed through the patent sphincterotomy site without resistance, and no further stones, debris, or sludge was removed.  We went ahead and proceeded with an occlusion cholangiogram in a customary fashion and again, no residual filling defects were seen.  The balloon was pulled through the patent  sphincterotomy site.  There was adequate biliary drainage.  The scope was removed after the wire was withdrawn.  The patient tolerated the procedure well.  There was no obvious immediate complications.  ENDOSCOPIC DIAGNOSES: 1. Normal ampulla. 2. No PD wire advancements or injections. 3. Slight dilated common bile duct with two small stones status post     medium sphincterotomy and removed with the adjustable 12-15 mm     balloon. 4. Three negative balloon pull throughs after above. 5. Negative occlusion cholangiogram with adequate biliary drainage.  PLAN:  Observation for delayed complications.  Follow liver test.  Clearance Coots of clear liquids in 2 hours if doing well, and we will slowly advance diet tomorrow if doing well.  Happy to see back p.r.n.          ______________________________ Petra Kuba, M.D.     MEM/MEDQ  D:  09/20/2010  T:  09/21/2010  Job:  [id number]  cc:   Abigail Miyamoto, M.D.  Electronically Signed by Vida Rigger M.D. on 10/20/2010 02:53:15 PM

## 2010-10-24 NOTE — Consult Note (Signed)
  [name] [name], [name] [name]                ACCOUNT NO.:  [id number]  MEDICAL RECORD NO.:  [id number]  LOCATION:  3710                         FACILITY:  [facility]  PHYSICIAN:  Graceann Boileau C. Madilyn Fireman, M.D.    DATE OF BIRTH:  [birth year]  DATE OF CONSULTATION:  09/18/2010 DATE OF DISCHARGE:                                CONSULTATION   REASON FOR CONSULTATION:  Gallstone pancreatitis.  HISTORY OF PRESENT ILLNESS:  The patient is an 77 year old white female who presented with 1-day history of epigastric pain with brief vomiting. She was found to have an elevated lipase of 265, bilirubin of 2.5, and ALT of 179 with gallstones on ultrasound and CT scan and a mildly dilated common bile duct with no obvious stones.  She had a white blood cell count of 12,500.  She has been started on Zosyn.  Her abdominal pain is significantly better this morning and repeat labs are pending. [facility] has been consulted and recommended laparoscopic cholecystectomy or preoperative ERCP depending on her short term course and trend in liver function tests.  PAST MEDICAL HISTORY: 1. COPD. 2. Mild dementia. 3. History of pulmonary embolism with IVC filter, not felt to be a     candidate for Coumadin.  MEDICATIONS:  Aricept, tramadol, Norvasc, and possibly others.  SOCIAL HISTORY:  The patient lives with her son.  She denies alcohol or tobacco use.  PHYSICAL EXAMINATION:  GENERAL:  A comfortable elderly white female, appears somewhat hard-of-hearing. HEART:  Regular rate and rhythm without murmurs. LUNGS:  Clear. ABDOMEN:  Soft.  Minimally tender in the epigastrium and both upper quadrants.  No hepatosplenomegaly, mass, or guarding.  IMPRESSION:  Gallstones with mild gallstone pancreatitis.  PLAN:  I agree with surgery to proceed with ERCP or laparoscopic cholecystectomy depending on her trend in liver function tests over the next day or two of observation.           ______________________________ Everardo All Madilyn Fireman, M.D.     JCH/MEDQ  D:  09/18/2010  T:  09/18/2010  Job:  [id number]  Electronically Signed by Dorena Cookey M.D. on 10/24/2010 11:40:07 AM

## 2011-02-24 ENCOUNTER — Other Ambulatory Visit: Payer: Self-pay | Admitting: Internal Medicine

## 2011-02-24 DIAGNOSIS — M542 Cervicalgia: Secondary | ICD-10-CM

## 2011-02-24 DIAGNOSIS — R42 Dizziness and giddiness: Secondary | ICD-10-CM

## 2011-02-24 DIAGNOSIS — R51 Headache: Secondary | ICD-10-CM

## 2011-03-02 ENCOUNTER — Ambulatory Visit
Admission: RE | Admit: 2011-03-02 | Discharge: 2011-03-02 | Disposition: A | Discharge status: Home / Self Care | Payer: Medicare Other | Source: Ambulatory Visit | Attending: Internal Medicine | Admitting: Internal Medicine

## 2011-03-02 DIAGNOSIS — M542 Cervicalgia: Secondary | ICD-10-CM

## 2011-03-02 DIAGNOSIS — R51 Headache: Secondary | ICD-10-CM

## 2011-03-02 DIAGNOSIS — R42 Dizziness and giddiness: Secondary | ICD-10-CM

## 2011-03-24 ENCOUNTER — Encounter (HOSPITAL_COMMUNITY): Payer: Self-pay | Admitting: Emergency Medicine

## 2011-03-24 ENCOUNTER — Emergency Department (HOSPITAL_COMMUNITY)
Admission: EM | Admit: 2011-03-24 | Discharge: 2011-03-24 | Disposition: A | Discharge status: Home / Self Care | Payer: Medicare Other | Attending: Emergency Medicine | Admitting: Emergency Medicine

## 2011-03-24 ENCOUNTER — Emergency Department (HOSPITAL_COMMUNITY): Payer: Medicare Other

## 2011-03-24 DIAGNOSIS — S0180XA Unspecified open wound of other part of head, initial encounter: Secondary | ICD-10-CM | POA: Insufficient documentation

## 2011-03-24 DIAGNOSIS — M47812 Spondylosis without myelopathy or radiculopathy, cervical region: Secondary | ICD-10-CM | POA: Insufficient documentation

## 2011-03-24 DIAGNOSIS — S0181XA Laceration without foreign body of other part of head, initial encounter: Secondary | ICD-10-CM

## 2011-03-24 DIAGNOSIS — S0990XA Unspecified injury of head, initial encounter: Secondary | ICD-10-CM | POA: Insufficient documentation

## 2011-03-24 DIAGNOSIS — W1809XA Striking against other object with subsequent fall, initial encounter: Secondary | ICD-10-CM | POA: Insufficient documentation

## 2011-03-24 DIAGNOSIS — Z7901 Long term (current) use of anticoagulants: Secondary | ICD-10-CM | POA: Insufficient documentation

## 2011-03-24 MED ORDER — "THROMBI-PAD 3""X3"" EX PADS"
1.0000 | MEDICATED_PAD | Freq: Once | CUTANEOUS | Status: AC
  Administered 2011-03-24: 1 via TOPICAL
  Filled 2011-03-24: qty 1

## 2011-03-24 MED ORDER — TETANUS-DIPHTH-ACELL PERTUSSIS 5-2.5-18.5 LF-MCG/0.5 IM SUSP
0.5000 mL | Freq: Once | INTRAMUSCULAR | Status: AC
  Administered 2011-03-24: 0.5 mL via INTRAMUSCULAR
  Filled 2011-03-24: qty 0.5

## 2011-03-24 NOTE — ED Provider Notes (Signed)
Small foreign laceration on examination repaired with Dermabond by the physician's assistant.  Medical screening examination/treatment/procedure(s) were conducted as a shared visit with non-physician practitioner(s) and myself.  I personally evaluated the patient during the encounter  Hurman Horn, MD 03/24/11 [phone]

## 2011-03-24 NOTE — ED Notes (Signed)
Patient ambulating into office coming into a light environment to a dark tripped and fall laceration above left eye one inch per EMS no loc full immobilized. ax4

## 2011-03-24 NOTE — Discharge Instructions (Signed)
Head Injury, Adult A head injury happens when the head is hit really hard. A head injury may cause sleepiness, headache, throwing up (vomiting), and problems seeing. If the head injury is really bad, you may need to stay in the hospital. HOME CARE  Have someone with you for the first 24 hours. This person should wake you up every 1 hour to check on your condition.   Only drink water or clear fluid for the rest of the day. Then, go back to your regular diet.   Only take medicines as told by your doctor. Do not take aspirin.   Do not drink alcohol for 2 days.   Do not take medicines that help your relax (sedatives) for 2 days.  Side effects may happen for up to 7 to 10 days. Watch for new problems. GET HELP RIGHT AWAY IF:   You are confused or sleepy.   You cannot be woken up.   You feel sick to your stomach (nauseous) or keep throwing up.   Your dizziness or unsteadiness is get worse, or your cannot walk.   You start to shake (convulse) or pass out (faint).   You have very bad, lasting headaches that are not helped by medicine.   You cannot use your arms or legs like normal.   You have clear or bloody fluid coming from your nose or ears.  MAKE SURE YOU:   Understand these instructions.   Will watch your condition.   Will get help right away if you are not doing well or get worse.  Document Released: 12/31/2007 Document Revised: 09/29/2010 Document Reviewed: 12/03/2008 [facility] Patient Information 2012 [place], [place].  Facial Laceration A facial laceration is a cut on the face. Lacerations usually heal quickly, but they need special care to reduce scarring. It will take 1 to 2 years for the scar to lose its redness and to heal completely. TREATMENT  Some facial lacerations may not require closure. Some lacerations may not be able to be closed due to an increased risk of infection. It is important to see your caregiver as soon as possible after an injury to minimize the risk  of infection and to maximize the opportunity for successful closure. If closure is appropriate, pain medicines may be given, if needed. The wound will be cleaned to help prevent infection. Your caregiver will use stitches (sutures), staples, wound glue (adhesive), or skin adhesive strips to repair the laceration. These tools bring the skin edges together to allow for faster healing and a better cosmetic outcome. However, all wounds will heal with a scar.  Once the wound has healed, scarring can be minimized by covering the wound with sunscreen during the day for 1 full year. Use a sunscreen with an SPF of at least 30. Sunscreen helps to reduce the pigment that will form in the scar. When applying sunscreen to a completely healed wound, massage the scar for a few minutes to help reduce the appearance of the scar. Use circular motions with your fingertips, on and around the scar. Do not massage a healing wound. HOME CARE INSTRUCTIONS For sutures:  Keep the wound clean and dry.   If you were given a bandage (dressing), you should change it at least once a day. Also change the dressing if it becomes wet or dirty, or as directed by your caregiver.   Wash the wound with soap and water 2 times a day. Rinse the wound off with water to remove all soap. Pat the wound  dry with a clean towel.   After cleaning, apply a thin layer of the antibiotic ointment recommended by your caregiver. This will help prevent infection and keep the dressing from sticking.   You may shower as usual after the first 24 hours. Do not soak the wound in water until the sutures are removed.   Only take over-the-counter or prescription medicines for pain, discomfort, or fever as directed by your caregiver.   Get your sutures removed as directed by your caregiver. With facial lacerations, sutures should usually be taken out after 4 to 5 days to avoid stitch marks.   Wait a few days after your sutures are removed before applying  makeup.  For skin adhesive strips:  Keep the wound clean and dry.   Do not get the skin adhesive strips wet. You may bathe carefully, using caution to keep the wound dry.   If the wound gets wet, pat it dry with a clean towel.   Skin adhesive strips will fall off on their own. You may trim the strips as the wound heals. Do not remove skin adhesive strips that are still stuck to the wound. They will fall off in time.  For wound adhesive:  You may briefly wet your wound in the shower or bath. Do not soak or scrub the wound. Do not swim. Avoid periods of heavy perspiration until the skin adhesive has fallen off on its own. After showering or bathing, gently pat the wound dry with a clean towel.   Do not apply liquid medicine, cream medicine, ointment medicine, or makeup to your wound while the skin adhesive is in place. This may loosen the film before your wound is healed.   If a dressing is placed over the wound, be careful not to apply tape directly over the skin adhesive. This may cause the adhesive to be pulled off before the wound is healed.   Avoid prolonged exposure to sunlight or tanning lamps while the skin adhesive is in place. Exposure to ultraviolet light in the first year will darken the scar.   The skin adhesive will usually remain in place for 5 to 10 days, then naturally fall off the skin. Do not pick at the adhesive film.  You may need a tetanus shot if:  You cannot remember when you had your last tetanus shot.   You have never had a tetanus shot.  If you get a tetanus shot, your arm may swell, get red, and feel warm to the touch. This is common and not a problem. If you need a tetanus shot and you choose not to have one, there is a rare chance of getting tetanus. Sickness from tetanus can be serious. SEEK IMMEDIATE MEDICAL CARE IF:  You develop redness, pain, or swelling around the wound.   There is yellowish-white fluid (pus) coming from the wound.   You develop  chills or a fever.  MAKE SURE YOU:  Understand these instructions.   Will watch your condition.   Will get help right away if you are not doing well or get worse.  Document Released: 02/25/2004 Document Revised: 09/29/2010 Document Reviewed: 07/12/2010 [facility] Patient Information 2012 [place], [place].

## 2011-03-24 NOTE — ED Notes (Signed)
Using spinal precautions, pt log rolled with backboard removed.  C-collar remains in place.  Pt denied any spinal tenderness.

## 2011-03-24 NOTE — ED Provider Notes (Signed)
History     CSN: [id number]  Arrival date & time 03/24/11  1035   First MD Initiated Contact with Patient 03/24/11 1039      Chief Complaint  Patient presents with  . Fall    (Consider location/radiation/quality/duration/timing/severity/associated sxs/prior treatment) HPI  Patient presents to ER for head injury and fall with her son at bedside reporting that patient was entering from outside in the sunlight back inside into darker room and had difficulty seeing and tripped on her entrance into the door way causing her to fall forwards and strike left forehead on a chair causing laceration. Both patient and son deny LOC. Patient states that she wanted to stand, denying any complaints of pain or injury but was instructed to remain on the floor until EMS arrived and placed her in C collar and on LSB though patient continues to deny HA, visual changes, dizziness, n/v, neck pain, back pain, extremity pain or injury. Patient has taken nothing for pain PTA denying pain. Denies aggravating or alleviating factors.   History reviewed. No pertinent past medical history.  History reviewed. No pertinent past surgical history.  No family history on file.  History  Substance Use Topics  . Smoking status: Never Smoker   . Smokeless tobacco: Not on file  . Alcohol Use: No    OB History    Grav Para Term Preterm Abortions TAB SAB Ect Mult Living                  Review of Systems  All other systems reviewed and are negative.    Allergies  Review of patient's allergies indicates no known allergies.  Home Medications   Current Outpatient Rx  Name Route Sig Dispense Refill  . BUDESONIDE-FORMOTEROL FUMARATE 80-4.5 MCG/ACT IN AERO Inhalation Inhale 2 puffs into the lungs 2 (two) times daily.    Marland Kitchen DILTIAZEM HCL ER 120 MG PO CP24 Oral Take 120 mg by mouth daily.    . DONEPEZIL HCL 5 MG PO TABS Oral Take 5 mg by mouth at bedtime as needed.    Marland Kitchen FLUTICASONE PROPIONATE 50 MCG/ACT NA SUSP  Nasal Place 2 sprays into the nose daily.    Marland Kitchen HYDROCODONE-ACETAMINOPHEN 5-325 MG PO TABS Oral Take 1 tablet by mouth every 6 (six) hours as needed. For pain    . PANTOPRAZOLE SODIUM 40 MG PO TBEC Oral Take 40 mg by mouth daily.    Marland Kitchen PAROXETINE HCL 30 MG PO TABS Oral Take 30 mg by mouth every morning.    Marland Kitchen TRAMADOL HCL 50 MG PO TABS Oral Take 50 mg by mouth every 6 (six) hours as needed.    . WARFARIN SODIUM 5 MG PO TABS Oral Take 5 mg by mouth daily.      BP 119/94  Pulse 63  Temp(Src) 97.8 F (36.6 C) (Oral)  Ht 5\' 6"  (1.676 m)  Wt 161 lb (73.029 kg)  BMI 25.99 kg/m2  SpO2 95%  Physical Exam  Constitutional: She is oriented to person, place, and time. She appears well-developed and well-nourished. No distress. Cervical collar and backboard in place.  HENT:  Head: Normocephalic.       1.5 cm linear laceration on left lateral forehead, oozing blood. No underlying hematoma or step off  Eyes: Conjunctivae and EOM are normal. Pupils are equal, round, and reactive to light.  Neck: Normal range of motion. Neck supple. No tracheal deviation present. No thyromegaly present.  Cardiovascular: Normal rate, regular rhythm, S1 normal, S2 normal and  normal heart sounds.   Pulmonary/Chest: Effort normal and breath sounds normal. No respiratory distress. She has no wheezes. She has no rales. She exhibits no tenderness and no crepitus.  Abdominal: Soft. Normal appearance and bowel sounds are normal. She exhibits no distension and no mass. There is no tenderness. There is no rigidity, no rebound and no guarding.  Musculoskeletal: Normal range of motion. She exhibits no edema and no tenderness.       Right shoulder: She exhibits normal range of motion, no tenderness, no swelling, no effusion and no deformity.       No spinal midline TTP. Pelvis stable without pain. FROM of bilateral UE and LE without pain.   Neurological: She is alert and oriented to person, place, and time. No cranial nerve deficit.    Skin: Skin is warm and dry. She is not diaphoretic.  Psychiatric: She has a normal mood and affect.    ED Course  Procedures (including critical care time)  IM tetanus  LACERATION REPAIR Performed by: Jenness Corner Authorized by: Jenness Corner Consent: Verbal consent obtained. Risks and benefits: risks, benefits and alternatives were discussed Consent given by: patient Patient identity confirmed: provided demographic data Prepped and Draped in normal sterile fashion Wound explored  Laceration Location: left lateral forehead  Laceration Length: 1.5cm  No Foreign Bodies seen or palpated  Anesthesia: none  Local anesthetic: none  Irrigation method: syringe Amount of cleaning: standard  Skin closure: dermabond  Technique: dermabond  Patient tolerance: Patient tolerated the procedure well with no immediate complications.  Patient with recurrence of oozing from inferior 2mm aspect of laceration after dermabond. Will apply thrombo pad and reassess. Patient continues to sit in bed without complaint   Labs Reviewed - No data to display Ct Head Wo Contrast  03/24/2011  *RADIOLOGY REPORT*  Clinical Data:  Fall  CT HEAD WITHOUT CONTRAST CT CERVICAL SPINE WITHOUT CONTRAST  Technique:  Multidetector CT imaging of the head and cervical spine was performed following the standard protocol without intravenous contrast.  Multiplanar CT image reconstructions of the cervical spine were also generated.  Comparison:  December 20, 2008  CT HEAD  Findings: The ventricles are midline.  There is no acute hemorrhage or abnormal extra-axial fluid.  Diffuse cerebral atrophy and chronic ischemic changes do not appear significantly changed. There is no evidence of acute infarct or mass effect. The orbits, calvarium, visualized paranasal sinuses have a normal appearance.  IMPRESSION: There is no evidence of acute intracranial abnormality.  Stable appearing chronic ischemic changes and cerebral atrophy.   CT CERVICAL SPINE  Findings: The odontoid is intact and the lateral masses are well- aligned.  There is reversal of the normal cervical lordosis and prominent cervical spondylosis from C3-C7, similar to the prior study. Prominent facet degenerative changes are present at multiple levels with multilevel narrowing of the neural foramina, similar to the prior study.  The prevertebral soft tissue stripe is normal. There is no evidence of fracture or dislocation.  IMPRESSION: There is no evidence of cervical spine fracture or dislocation.  Prominent cervical spondylosis and facet joint hypertrophy from C3- C7.  Original Report Authenticated By: Brandon Melnick, M.D.   Ct Cervical Spine Wo Contrast  03/24/2011  *RADIOLOGY REPORT*  Clinical Data:  Fall  CT HEAD WITHOUT CONTRAST CT CERVICAL SPINE WITHOUT CONTRAST  Technique:  Multidetector CT imaging of the head and cervical spine was performed following the standard protocol without intravenous contrast.  Multiplanar CT image reconstructions of the cervical spine  were also generated.  Comparison:  December 20, 2008  CT HEAD  Findings: The ventricles are midline.  There is no acute hemorrhage or abnormal extra-axial fluid.  Diffuse cerebral atrophy and chronic ischemic changes do not appear significantly changed. There is no evidence of acute infarct or mass effect. The orbits, calvarium, visualized paranasal sinuses have a normal appearance.  IMPRESSION: There is no evidence of acute intracranial abnormality.  Stable appearing chronic ischemic changes and cerebral atrophy.  CT CERVICAL SPINE  Findings: The odontoid is intact and the lateral masses are well- aligned.  There is reversal of the normal cervical lordosis and prominent cervical spondylosis from C3-C7, similar to the prior study. Prominent facet degenerative changes are present at multiple levels with multilevel narrowing of the neural foramina, similar to the prior study.  The prevertebral soft tissue stripe  is normal. There is no evidence of fracture or dislocation.  IMPRESSION: There is no evidence of cervical spine fracture or dislocation.  Prominent cervical spondylosis and facet joint hypertrophy from C3- C7.  Original Report Authenticated By: Brandon Melnick, M.D.     1. Minor head injury   2. Forehead laceration       MDM  Patient is alert and oriented with no neuro focal findings and without complaint of pain throughout ER stay. There is no acute findings on CT head or C-spine. Wound closed with Dermabond with hemostasis obtained after from a pad. Patient is ambulating with assistance without difficulty because she does not have her walker.        Jenness Corner, [place] 03/24/11 [phone]

## 2011-03-25 ENCOUNTER — Encounter (HOSPITAL_COMMUNITY): Payer: Self-pay | Admitting: *Deleted

## 2011-03-25 ENCOUNTER — Emergency Department (HOSPITAL_COMMUNITY)
Admission: EM | Admit: 2011-03-25 | Discharge: 2011-03-25 | Disposition: A | Discharge status: Home Health | Payer: Medicare Other | Attending: Emergency Medicine | Admitting: Emergency Medicine

## 2011-03-25 DIAGNOSIS — S0180XA Unspecified open wound of other part of head, initial encounter: Secondary | ICD-10-CM | POA: Insufficient documentation

## 2011-03-25 DIAGNOSIS — Y9210 Unspecified residential institution as the place of occurrence of the external cause: Secondary | ICD-10-CM | POA: Insufficient documentation

## 2011-03-25 DIAGNOSIS — S0181XA Laceration without foreign body of other part of head, initial encounter: Secondary | ICD-10-CM

## 2011-03-25 DIAGNOSIS — G319 Degenerative disease of nervous system, unspecified: Secondary | ICD-10-CM | POA: Insufficient documentation

## 2011-03-25 DIAGNOSIS — W010XXA Fall on same level from slipping, tripping and stumbling without subsequent striking against object, initial encounter: Secondary | ICD-10-CM | POA: Insufficient documentation

## 2011-03-25 LAB — PROTIME-INR: INR: 2.99 — ABNORMAL HIGH (ref 0.00–1.49)

## 2011-03-25 LAB — CBC
HCT: 34.1 % — ABNORMAL LOW (ref 36.0–46.0)
Hemoglobin: 11.3 g/dL — ABNORMAL LOW (ref 12.0–15.0)
MCHC: 33.1 g/dL (ref 30.0–36.0)
MCV: 78.2 fL (ref 78.0–100.0)
RDW: 15 % (ref 11.5–15.5)
WBC: 7.8 10*3/uL (ref 4.0–10.5)

## 2011-03-25 LAB — DIFFERENTIAL
Eosinophils Relative: 3 % (ref 0–5)
Lymphocytes Relative: 21 % (ref 12–46)
Monocytes Absolute: 0.4 10*3/uL (ref 0.1–1.0)
Monocytes Relative: 5 % (ref 3–12)
Neutro Abs: 5.5 10*3/uL (ref 1.7–7.7)

## 2011-03-25 MED ORDER — LIDOCAINE-EPINEPHRINE 1 %-1:100000 IJ SOLN
10.0000 mL | Freq: Once | INTRAMUSCULAR | Status: AC
  Administered 2011-03-25: 10 mL

## 2011-03-25 NOTE — ED Provider Notes (Signed)
Tripped and fell yesterday as striking her head had head CT which was normal laceration for head was repaired with Dermabond but continues to bleed. Has no other complaint no new injury on exam alert appropriate Glasgow Coma Score 15. No distress alert [place] coma score 15 moves all extremities well Linear laceration and fore head oozing blood.  Doug Sou, MD 03/25/11 [phone]

## 2011-03-25 NOTE — ED Notes (Addendum)
Pt fell yesterday and was seen for same. Pt reports she is still bleeding from her forehead. Pt is on coumadin. Well approximated laceration to left side of forehead. States she has been bleeding intermittently since yesterday.

## 2011-03-25 NOTE — ED Provider Notes (Signed)
History     CSN: [id number]  Arrival date & time 03/25/11  0723   First MD Initiated Contact with Patient 03/25/11 0725     7:43 AM HPI Patient is return to the emergency department today. Was seen here yesterday for a laceration. States at approximately 10:30 she had an appointment with Dr. Nile Riggs. States when she walked into the doorway she tripped and stumbled. Reports she fell in her head on a chair resulting in a 1.5 cm laceration. Patient denies loss of consciousness, headache, neck pain, visual changes, back pain, numbness, tingling, weakness. Family reports that all last  night was complaining of mild dizziness and fatigue. Patient reports she is fatigued since she was unable to sleep. Patient is on Coumadin. States she is here today because would continues to bleed. Patient is a 78 y.o. female presenting with scalp laceration. The history is provided by the patient.  Head Laceration This is a new problem. The current episode started yesterday (21 hours). The problem occurs constantly. The problem has been unchanged. Associated symptoms include fatigue. Pertinent negatives include no headaches, nausea, neck pain, numbness, visual change, vomiting or weakness. The symptoms are aggravated by nothing. Treatments tried: dermabond. The treatment provided no relief.    No past medical history on file.  No past surgical history on file.  No family history on file.  History  Substance Use Topics  . Smoking status: Never Smoker   . Smokeless tobacco: Not on file  . Alcohol Use: No    OB History    Grav Para Term Preterm Abortions TAB SAB Ect Mult Living                  Review of Systems  Constitutional: Positive for fatigue.  HENT: Negative for neck pain.   Gastrointestinal: Negative for nausea and vomiting.  Skin: Positive for wound (laceration). Negative for color change and pallor.  Neurological: Positive for dizziness. Negative for weakness, numbness and headaches.  All  other systems reviewed and are negative.    Allergies  Review of patient's allergies indicates no known allergies.  Home Medications   Current Outpatient Rx  Name Route Sig Dispense Refill  . BUDESONIDE-FORMOTEROL FUMARATE 80-4.5 MCG/ACT IN AERO Inhalation Inhale 2 puffs into the lungs 2 (two) times daily.    Marland Kitchen DILTIAZEM HCL ER 120 MG PO CP24 Oral Take 120 mg by mouth daily.    . DONEPEZIL HCL 5 MG PO TABS Oral Take 5 mg by mouth at bedtime as needed.    Marland Kitchen FLUTICASONE PROPIONATE 50 MCG/ACT NA SUSP Nasal Place 2 sprays into the nose daily.    Marland Kitchen HYDROCODONE-ACETAMINOPHEN 5-325 MG PO TABS Oral Take 1 tablet by mouth every 6 (six) hours as needed. For pain    . PANTOPRAZOLE SODIUM 40 MG PO TBEC Oral Take 40 mg by mouth daily.    Marland Kitchen PAROXETINE HCL 30 MG PO TABS Oral Take 30 mg by mouth every morning.    Marland Kitchen TRAMADOL HCL 50 MG PO TABS Oral Take 50 mg by mouth every 6 (six) hours as needed.    . WARFARIN SODIUM 5 MG PO TABS Oral Take 5 mg by mouth daily.      There were no vitals taken for this visit.  Physical Exam  Vitals reviewed. Constitutional: She is oriented to person, place, and time. Vital signs are normal. She appears well-developed and well-nourished. No distress.  HENT:  Head: Normocephalic. Head is with laceration (left forehead laceration approximately 1.5 cm.  Continuing to ooze blood the small hematoma underlying Dermabond.. depth: subQ. NT).  Eyes: Pupils are equal, round, and reactive to light.  Neck: Neck supple.  Pulmonary/Chest: Effort normal.  Neurological: She is alert and oriented to person, place, and time. No cranial nerve deficit or sensory deficit. She exhibits normal muscle tone. Coordination normal.  Skin: Skin is warm and dry. No rash noted. No erythema. No pallor.  Psychiatric: She has a normal mood and affect. Her behavior is normal.    ED Course  Wound repair Date/Time: 03/25/2011 8:37 AM Performed by: Thomasene Lot Authorized by: Thomasene Lot Consent: Verbal consent obtained. Risks and benefits: risks, benefits and alternatives were discussed Consent given by: patient Patient understanding: patient states understanding of the procedure being performed Patient identity confirmed: verbally with patient Time out: Immediately prior to procedure a "time out" was called to verify the correct patient, procedure, equipment, support staff and site/side marked as required. Preparation: Patient was prepped and draped in the usual sterile fashion. Local anesthesia used: yes Anesthesia: local infiltration Local anesthetic: lidocaine 2% with epinephrine Anesthetic total: 3 ml Patient sedated: no Patient tolerance: Patient tolerated the procedure well with no immediate complications. Comments: 3- 4-0 Prolene sutures placed into 1.5 cm laceration on left forehead. Wound seal powder place on top and covered with sterile 4X4 gauze. Will reassess for bleeding in 10 minutes   Patient's head CT from yesterday. Results displayed below.  CT HEAD  Findings: The ventricles are midline. There is no acute hemorrhage  or abnormal extra-axial fluid. Diffuse cerebral atrophy and  chronic ischemic changes do not appear significantly changed.  There is no evidence of acute infarct or mass effect. The orbits,  calvarium, visualized paranasal sinuses have a normal appearance.   IMPRESSION:  There is no evidence of acute intracranial abnormality.  Stable appearing chronic ischemic changes and cerebral atrophy.    CT CERVICAL SPINE  Findings: The odontoid is intact and the lateral masses are well-  aligned. There is reversal of the normal cervical lordosis and  prominent cervical spondylosis from C3-C7, similar to the prior  study. Prominent facet degenerative changes are present at multiple  levels with multilevel narrowing of the neural foramina, similar to  the prior study. The prevertebral soft tissue stripe is normal.  There is no  evidence of fracture or dislocation.   IMPRESSION:  There is no evidence of cervical spine fracture or dislocation.  Prominent cervical spondylosis and facet joint hypertrophy from C3-  C7.      MDM   7:49 AM Will order CBC and PT. Patient has had no fall. Spoke with Dr. Ethelda Chick. We do not feel another CT head is necessary since patient has had no new fall and no neurological symptoms  9:04 AM Hemostasis achieved. Advised suture removal in 5 days. Advised return to emergency pertinent for worsening symptoms. Patient and family group and are ready for discharge.    Thomasene Lot, PA-C 03/25/11 [phone]

## 2011-03-25 NOTE — Discharge Instructions (Signed)
Facial Laceration °A facial laceration is a cut on the face. Lacerations usually heal quickly, but they need special care to reduce scarring. It will take 1 to 2 years for the scar to lose its redness and to heal completely. °TREATMENT  °Some facial lacerations may not require closure. Some lacerations may not be able to be closed due to an increased risk of infection. It is important to see your caregiver as soon as possible after an injury to minimize the risk of infection and to maximize the opportunity for successful closure. °If closure is appropriate, pain medicines may be given, if needed. The wound will be cleaned to help prevent infection. Your caregiver will use stitches (sutures), staples, wound glue (adhesive), or skin adhesive strips to repair the laceration. These tools bring the skin edges together to allow for faster healing and a better cosmetic outcome. However, all wounds will heal with a scar.  °Once the wound has healed, scarring can be minimized by covering the wound with sunscreen during the day for 1 full year. Use a sunscreen with an SPF of at least 30. Sunscreen helps to reduce the pigment that will form in the scar. When applying sunscreen to a completely healed wound, massage the scar for a few minutes to help reduce the appearance of the scar. Use circular motions with your fingertips, on and around the scar. Do not massage a healing wound. °HOME CARE INSTRUCTIONS °For sutures: °· Keep the wound clean and dry.  °· If you were given a bandage (dressing), you should change it at least once a day. Also change the dressing if it becomes wet or dirty, or as directed by your caregiver.  °· Wash the wound with soap and water 2 times a day. Rinse the wound off with water to remove all soap. Pat the wound dry with a clean towel.  °· After cleaning, apply a thin layer of the antibiotic ointment recommended by your caregiver. This will help prevent infection and keep the dressing from sticking.   °· You may shower as usual after the first 24 hours. Do not soak the wound in water until the sutures are removed.  °· Only take over-the-counter or prescription medicines for pain, discomfort, or fever as directed by your caregiver.  °· Get your sutures removed as directed by your caregiver. With facial lacerations, sutures should usually be taken out after 4 to 5 days to avoid stitch marks.  °· Wait a few days after your sutures are removed before applying makeup.  °For skin adhesive strips: °· Keep the wound clean and dry.  °· Do not get the skin adhesive strips wet. You may bathe carefully, using caution to keep the wound dry.  °· If the wound gets wet, pat it dry with a clean towel.  °· Skin adhesive strips will fall off on their own. You may trim the strips as the wound heals. Do not remove skin adhesive strips that are still stuck to the wound. They will fall off in time.  °For wound adhesive: °· You may briefly wet your wound in the shower or bath. Do not soak or scrub the wound. Do not swim. Avoid periods of heavy perspiration until the skin adhesive has fallen off on its own. After showering or bathing, gently pat the wound dry with a clean towel.  °· Do not apply liquid medicine, cream medicine, ointment medicine, or makeup to your wound while the skin adhesive is in place. This may loosen the film   before your wound is healed.  °· If a dressing is placed over the wound, be careful not to apply tape directly over the skin adhesive. This may cause the adhesive to be pulled off before the wound is healed.  °· Avoid prolonged exposure to sunlight or tanning lamps while the skin adhesive is in place. Exposure to ultraviolet light in the first year will darken the scar.  °· The skin adhesive will usually remain in place for 5 to 10 days, then naturally fall off the skin. Do not pick at the adhesive film.  °You may need a tetanus shot if: °· You cannot remember when you had your last tetanus shot.  °· You have  never had a tetanus shot.  °If you get a tetanus shot, your arm may swell, get red, and feel warm to the touch. This is common and not a problem. If you need a tetanus shot and you choose not to have one, there is a rare chance of getting tetanus. Sickness from tetanus can be serious. °SEEK IMMEDIATE MEDICAL CARE IF: °· You develop redness, pain, or swelling around the wound.  °· There is yellowish-white fluid (pus) coming from the wound.  °· You develop chills or a fever.  °MAKE SURE YOU: °· Understand these instructions.  °· Will watch your condition.  °· Will get help right away if you are not doing well or get worse.  °Document Released: 02/25/2004 Document Revised: 09/29/2010 Document Reviewed: 07/12/2010 °[employer]® Patient Information ©2012 [employer]. °

## 2011-03-25 NOTE — ED Provider Notes (Signed)
Medical screening examination/treatment/procedure(s) were conducted as a shared visit with non-physician practitioner(s) and myself.  I personally evaluated the patient during the encounter  Doug Sou, MD 03/25/11 1152

## 2011-03-28 ENCOUNTER — Ambulatory Visit: Payer: Medicare Other | Attending: Internal Medicine | Admitting: Rehabilitative and Restorative Service Providers"

## 2011-06-07 ENCOUNTER — Encounter (HOSPITAL_COMMUNITY): Payer: Self-pay | Admitting: *Deleted

## 2011-06-07 ENCOUNTER — Emergency Department (HOSPITAL_COMMUNITY): Payer: Medicare Other

## 2011-06-07 ENCOUNTER — Inpatient Hospital Stay (HOSPITAL_COMMUNITY): Payer: Medicare Other

## 2011-06-07 ENCOUNTER — Inpatient Hospital Stay (HOSPITAL_COMMUNITY)
Admission: EM | Admit: 2011-06-07 | Discharge: 2011-06-13 | DRG: 091 | Disposition: A | Discharge status: Skilled Nursing Facility | Payer: Medicare Other | Source: Ambulatory Visit | Attending: Internal Medicine | Admitting: Internal Medicine

## 2011-06-07 DIAGNOSIS — B961 Klebsiella pneumoniae [K. pneumoniae] as the cause of diseases classified elsewhere: Secondary | ICD-10-CM | POA: Diagnosis present

## 2011-06-07 DIAGNOSIS — G929 Unspecified toxic encephalopathy: Principal | ICD-10-CM | POA: Diagnosis present

## 2011-06-07 DIAGNOSIS — J9601 Acute respiratory failure with hypoxia: Secondary | ICD-10-CM | POA: Diagnosis present

## 2011-06-07 DIAGNOSIS — J449 Chronic obstructive pulmonary disease, unspecified: Secondary | ICD-10-CM | POA: Diagnosis present

## 2011-06-07 DIAGNOSIS — E782 Mixed hyperlipidemia: Secondary | ICD-10-CM

## 2011-06-07 DIAGNOSIS — R1110 Vomiting, unspecified: Secondary | ICD-10-CM

## 2011-06-07 DIAGNOSIS — D72829 Elevated white blood cell count, unspecified: Secondary | ICD-10-CM

## 2011-06-07 DIAGNOSIS — D649 Anemia, unspecified: Secondary | ICD-10-CM

## 2011-06-07 DIAGNOSIS — R791 Abnormal coagulation profile: Secondary | ICD-10-CM

## 2011-06-07 DIAGNOSIS — I4891 Unspecified atrial fibrillation: Secondary | ICD-10-CM | POA: Diagnosis present

## 2011-06-07 DIAGNOSIS — J690 Pneumonitis due to inhalation of food and vomit: Secondary | ICD-10-CM | POA: Diagnosis present

## 2011-06-07 DIAGNOSIS — R197 Diarrhea, unspecified: Secondary | ICD-10-CM

## 2011-06-07 DIAGNOSIS — I482 Chronic atrial fibrillation, unspecified: Secondary | ICD-10-CM

## 2011-06-07 DIAGNOSIS — G309 Alzheimer's disease, unspecified: Secondary | ICD-10-CM | POA: Diagnosis present

## 2011-06-07 DIAGNOSIS — G928 Other toxic encephalopathy: Secondary | ICD-10-CM

## 2011-06-07 DIAGNOSIS — Z7901 Long term (current) use of anticoagulants: Secondary | ICD-10-CM

## 2011-06-07 DIAGNOSIS — F0280 Dementia in other diseases classified elsewhere without behavioral disturbance: Secondary | ICD-10-CM | POA: Diagnosis present

## 2011-06-07 DIAGNOSIS — E876 Hypokalemia: Secondary | ICD-10-CM

## 2011-06-07 DIAGNOSIS — R1310 Dysphagia, unspecified: Secondary | ICD-10-CM | POA: Diagnosis present

## 2011-06-07 DIAGNOSIS — K922 Gastrointestinal hemorrhage, unspecified: Secondary | ICD-10-CM

## 2011-06-07 DIAGNOSIS — I4892 Unspecified atrial flutter: Secondary | ICD-10-CM | POA: Diagnosis present

## 2011-06-07 DIAGNOSIS — D509 Iron deficiency anemia, unspecified: Secondary | ICD-10-CM | POA: Diagnosis present

## 2011-06-07 DIAGNOSIS — N390 Urinary tract infection, site not specified: Secondary | ICD-10-CM

## 2011-06-07 DIAGNOSIS — K920 Hematemesis: Secondary | ICD-10-CM

## 2011-06-07 DIAGNOSIS — J9600 Acute respiratory failure, unspecified whether with hypoxia or hypercapnia: Secondary | ICD-10-CM | POA: Diagnosis present

## 2011-06-07 DIAGNOSIS — G92 Toxic encephalopathy: Secondary | ICD-10-CM | POA: Diagnosis present

## 2011-06-07 DIAGNOSIS — D7289 Other specified disorders of white blood cells: Secondary | ICD-10-CM

## 2011-06-07 DIAGNOSIS — J4489 Other specified chronic obstructive pulmonary disease: Secondary | ICD-10-CM | POA: Diagnosis present

## 2011-06-07 DIAGNOSIS — K219 Gastro-esophageal reflux disease without esophagitis: Secondary | ICD-10-CM | POA: Diagnosis present

## 2011-06-07 DIAGNOSIS — T7601XA Adult neglect or abandonment, suspected, initial encounter: Secondary | ICD-10-CM

## 2011-06-07 HISTORY — DX: Reserved for concepts with insufficient information to code with codable children: IMO0002

## 2011-06-07 HISTORY — DX: Chronic obstructive pulmonary disease, unspecified: J44.9

## 2011-06-07 HISTORY — DX: Unspecified atrial fibrillation: I48.91

## 2011-06-07 HISTORY — DX: Gastro-esophageal reflux disease without esophagitis: K21.9

## 2011-06-07 LAB — URINE MICROSCOPIC-ADD ON

## 2011-06-07 LAB — COMPREHENSIVE METABOLIC PANEL
AST: 13 U/L (ref 0–37)
CO2: 25 mEq/L (ref 19–32)
Calcium: 9 mg/dL (ref 8.4–10.5)
Creatinine, Ser: 0.8 mg/dL (ref 0.50–1.10)
GFR calc Af Amer: 77 mL/min — ABNORMAL LOW (ref >90)
GFR calc non Af Amer: 67 mL/min — ABNORMAL LOW (ref >90)
Total Protein: 7.3 g/dL (ref 6.0–8.3)

## 2011-06-07 LAB — TYPE AND SCREEN: Antibody Screen: NEGATIVE

## 2011-06-07 LAB — URINALYSIS, ROUTINE W REFLEX MICROSCOPIC
Bilirubin Urine: NEGATIVE
Glucose, UA: NEGATIVE mg/dL
Ketones, ur: 15 mg/dL — AB
Specific Gravity, Urine: 1.022 (ref 1.005–1.030)
pH: 5.5 (ref 5.0–8.0)

## 2011-06-07 LAB — HEMOGLOBIN AND HEMATOCRIT, BLOOD
HCT: 33.9 % — ABNORMAL LOW (ref 36.0–46.0)
Hemoglobin: 10.8 g/dL — ABNORMAL LOW (ref 12.0–15.0)

## 2011-06-07 LAB — DIFFERENTIAL
Basophils Absolute: 0 10*3/uL (ref 0.0–0.1)
Basophils Relative: 0 % (ref 0–1)
Eosinophils Absolute: 0.1 10*3/uL (ref 0.0–0.7)
Monocytes Relative: 10 % (ref 3–12)
Neutrophils Relative %: 80 % — ABNORMAL HIGH (ref 43–77)

## 2011-06-07 LAB — PROTIME-INR: INR: 3.37 — ABNORMAL HIGH (ref 0.00–1.49)

## 2011-06-07 LAB — CBC
Hemoglobin: 10.4 g/dL — ABNORMAL LOW (ref 12.0–15.0)
MCH: 24.3 pg — ABNORMAL LOW (ref 26.0–34.0)
MCHC: 32.2 g/dL (ref 30.0–36.0)
Platelets: 321 10*3/uL (ref 150–400)
RDW: 14.8 % (ref 11.5–15.5)

## 2011-06-07 MED ORDER — ONDANSETRON HCL 4 MG/2ML IJ SOLN
4.0000 mg | Freq: Four times a day (QID) | INTRAMUSCULAR | Status: DC | PRN
  Administered 2011-06-07: 4 mg via INTRAVENOUS

## 2011-06-07 MED ORDER — SODIUM CHLORIDE 0.9 % IV SOLN
INTRAVENOUS | Status: DC
  Administered 2011-06-07 – 2011-06-08 (×4): via INTRAVENOUS

## 2011-06-07 MED ORDER — ONDANSETRON HCL 4 MG/2ML IJ SOLN
INTRAMUSCULAR | Status: AC
  Administered 2011-06-07: 20:00:00
  Filled 2011-06-07: qty 2

## 2011-06-07 MED ORDER — PROMETHAZINE HCL 25 MG/ML IJ SOLN
12.5000 mg | INTRAMUSCULAR | Status: AC
  Administered 2011-06-08: 12.5 mg via INTRAVENOUS
  Filled 2011-06-07: qty 1

## 2011-06-07 MED ORDER — DONEPEZIL HCL 10 MG PO TABS
10.0000 mg | ORAL_TABLET | Freq: Every day | ORAL | Status: DC
  Administered 2011-06-08 – 2011-06-12 (×4): 10 mg via ORAL
  Filled 2011-06-07 (×7): qty 1

## 2011-06-07 MED ORDER — ACETAMINOPHEN 325 MG PO TABS
650.0000 mg | ORAL_TABLET | Freq: Four times a day (QID) | ORAL | Status: DC | PRN
  Administered 2011-06-12: 650 mg via ORAL
  Filled 2011-06-07: qty 1
  Filled 2011-06-07: qty 2

## 2011-06-07 MED ORDER — IOHEXOL 300 MG/ML  SOLN
20.0000 mL | INTRAMUSCULAR | Status: AC
  Administered 2011-06-07: 20 mL via ORAL

## 2011-06-07 MED ORDER — DEXTROSE 5 % IV SOLN
1.0000 g | INTRAVENOUS | Status: DC
  Administered 2011-06-08 – 2011-06-09 (×2): 1 g via INTRAVENOUS
  Filled 2011-06-07 (×2): qty 10

## 2011-06-07 MED ORDER — PANTOPRAZOLE SODIUM 40 MG IV SOLR
40.0000 mg | Freq: Two times a day (BID) | INTRAVENOUS | Status: DC
  Administered 2011-06-07 – 2011-06-11 (×8): 40 mg via INTRAVENOUS
  Filled 2011-06-07 (×11): qty 40

## 2011-06-07 MED ORDER — FLUTICASONE PROPIONATE 50 MCG/ACT NA SUSP
2.0000 | Freq: Every day | NASAL | Status: DC
  Administered 2011-06-08 – 2011-06-13 (×4): 2 via NASAL
  Filled 2011-06-07: qty 16

## 2011-06-07 MED ORDER — ALBUTEROL SULFATE (5 MG/ML) 0.5% IN NEBU
2.5000 mg | INHALATION_SOLUTION | RESPIRATORY_TRACT | Status: DC | PRN
  Administered 2011-06-08: 2.5 mg via RESPIRATORY_TRACT
  Filled 2011-06-07: qty 0.5

## 2011-06-07 MED ORDER — ACETAMINOPHEN 650 MG RE SUPP
650.0000 mg | Freq: Four times a day (QID) | RECTAL | Status: DC | PRN
  Administered 2011-06-09: 650 mg via RECTAL
  Filled 2011-06-07: qty 1

## 2011-06-07 MED ORDER — POTASSIUM CHLORIDE 10 MEQ/100ML IV SOLN
10.0000 meq | INTRAVENOUS | Status: AC
  Administered 2011-06-07 (×2): 10 meq via INTRAVENOUS
  Filled 2011-06-07 (×2): qty 100

## 2011-06-07 MED ORDER — ONDANSETRON HCL 4 MG/2ML IJ SOLN: 4.0000 mg | Freq: Three times a day (TID) | INTRAMUSCULAR | Status: DC | PRN

## 2011-06-07 MED ORDER — PAROXETINE HCL 30 MG PO TABS
30.0000 mg | ORAL_TABLET | Freq: Every morning | ORAL | Status: DC
  Administered 2011-06-08 – 2011-06-13 (×6): 30 mg via ORAL
  Filled 2011-06-07 (×7): qty 1

## 2011-06-07 MED ORDER — DEXTROSE 5 % IV SOLN
1.0000 g | Freq: Once | INTRAVENOUS | Status: AC
  Administered 2011-06-07: 1 g via INTRAVENOUS
  Filled 2011-06-07: qty 10

## 2011-06-07 MED ORDER — DILTIAZEM HCL ER 120 MG PO CP24
120.0000 mg | ORAL_CAPSULE | Freq: Every day | ORAL | Status: DC
  Administered 2011-06-08: 120 mg via ORAL
  Filled 2011-06-07: qty 1

## 2011-06-07 MED ORDER — TRAMADOL HCL 50 MG PO TABS: 50.0000 mg | ORAL_TABLET | Freq: Four times a day (QID) | ORAL | Status: DC | PRN

## 2011-06-07 MED ORDER — SODIUM CHLORIDE 0.9 % IV SOLN
INTRAVENOUS | Status: DC
  Administered 2011-06-07 (×2): via INTRAVENOUS

## 2011-06-07 MED ORDER — POTASSIUM CHLORIDE CRYS ER 20 MEQ PO TBCR
40.0000 meq | EXTENDED_RELEASE_TABLET | Freq: Once | ORAL | Status: AC
  Administered 2011-06-07: 40 meq via ORAL
  Filled 2011-06-07: qty 2

## 2011-06-07 MED ORDER — BUDESONIDE-FORMOTEROL FUMARATE 80-4.5 MCG/ACT IN AERO
2.0000 | INHALATION_SPRAY | Freq: Two times a day (BID) | RESPIRATORY_TRACT | Status: DC
  Administered 2011-06-08: 2 via RESPIRATORY_TRACT
  Filled 2011-06-07: qty 6.9

## 2011-06-07 MED ORDER — DOCUSATE SODIUM 100 MG PO CAPS
100.0000 mg | ORAL_CAPSULE | Freq: Two times a day (BID) | ORAL | Status: DC
  Administered 2011-06-08 – 2011-06-13 (×8): 100 mg via ORAL
  Filled 2011-06-07 (×13): qty 1

## 2011-06-07 MED ORDER — ONDANSETRON HCL 4 MG/2ML IJ SOLN
4.0000 mg | Freq: Once | INTRAMUSCULAR | Status: AC
  Administered 2011-06-07: 4 mg via INTRAVENOUS
  Filled 2011-06-07: qty 2

## 2011-06-07 MED ORDER — ACETAMINOPHEN 325 MG PO TABS
650.0000 mg | ORAL_TABLET | Freq: Once | ORAL | Status: AC
  Administered 2011-06-07: 650 mg via ORAL
  Filled 2011-06-07: qty 2

## 2011-06-07 NOTE — ED Notes (Signed)
Pt will go to admit floor after CT.

## 2011-06-07 NOTE — ED Notes (Signed)
Patient with reported slurred speech last night.  She is now having dizziness and nausea.  Patient with no headache at present.  Family states speech continues to be slurred.  No drift noted.  No facial weakness noted

## 2011-06-07 NOTE — ED Notes (Signed)
Family Information: Son: Kjirsten Bloodgood, [phone]   Daughter in law: Glorious Peach

## 2011-06-07 NOTE — Progress Notes (Signed)
KUB 2 view neg for ileus, free air, or obstruction. Holding off on CT abd for now. Pt hasn't vomited for about an hour now. May be 2/2 UTI or getting fed in the ED. Will cont to monitor. Maren Reamer, NP

## 2011-06-07 NOTE — ED Notes (Signed)
Spoke to MD Regalato about clear liquid diet.  MD made aware that pt is eating solid foods at this time.  MD said reason was because of dark vomit earlier in the day and just to monitor pt to make sure she keeps down food.

## 2011-06-07 NOTE — Progress Notes (Signed)
RN called this NP 2/2 pt having persistent vomiting since arriving from ED approximately 45 min ago. RN reports 500cc per incident of yellowish fluid with some food particles. Apparently, the pt received a meal tray in the ED when she was supposed to be NPO. NP to bedside. S: Above info per RN. Pt says she is dizzy and nauseated. Denies frank abd pain or chest pain or SOB.  O: Vitals signs reviewed as stable. Pt is alert and oriented. She appears fairly well but not in any acute distress. She is pale. Card: S1S2, RRR with 3/6 SEM. Lungs with dimished breath sounds. Abd with decreased to no bowel sounds in upper quads and normal in lower quads. Some tenderness, generalized, with palpation. A/P:  1. Persistent N/V. She had vomiting prior to arrival and in the ED and now. Given this and bowel sounds, will get a stat CT abd/pelvis. Zofran. Increased fluid to 125cc/hr. NPO except ice chips and small sips with meds. It is time for labs, so will review those and especially K as it was low on arrival.  Maren Reamer, NP [facility]

## 2011-06-07 NOTE — ED Notes (Signed)
Attempted to call report.  RN from [id number] will call back.

## 2011-06-07 NOTE — ED Provider Notes (Signed)
History     CSN: [id number]  Arrival date & time 06/07/11  1247   First MD Initiated Contact with Patient 06/07/11 1343      Chief Complaint  Patient presents with  . Aphasia  . Dizziness  . Nausea     HPI Patient with reported strange  speech last night which was associated with fevers.. She is now having dizziness and nausea. Patient with no headache at present. Family states speech continues to be confused. No drift noted. No facial weakness noted.  Patient has underlying history of Alzheimer's.  Past Medical History  Diagnosis Date  . Atrial fib/flutter, transient   . COPD (chronic obstructive pulmonary disease)   . GERD (gastroesophageal reflux disease)   . Pain     Past Surgical History  Procedure Date  . Nasal sinus surgery     No family history on file.  History  Substance Use Topics  . Smoking status: Never Smoker   . Smokeless tobacco: Not on file  . Alcohol Use: No    OB History    Grav Para Term Preterm Abortions TAB SAB Ect Mult Living                  Review of Systems  All other systems reviewed and are negative.    Allergies  Review of patient's allergies indicates no known allergies.  Home Medications   Current Outpatient Rx  Name Route Sig Dispense Refill  . BUDESONIDE-FORMOTEROL FUMARATE 80-4.5 MCG/ACT IN AERO Inhalation Inhale 2 puffs into the lungs 2 (two) times daily.    Marland Kitchen DILTIAZEM HCL ER 120 MG PO CP24 Oral Take 120 mg by mouth daily.    . DONEPEZIL HCL 5 MG PO TABS Oral Take 10 mg by mouth at bedtime.     Marland Kitchen FLUTICASONE PROPIONATE 50 MCG/ACT NA SUSP Nasal Place 2 sprays into the nose daily.    Marland Kitchen OVER THE COUNTER MEDICATION Oral Take 1 tablet by mouth at bedtime as needed. Sleep aid    . PANTOPRAZOLE SODIUM 40 MG PO TBEC Oral Take 40 mg by mouth daily.    Marland Kitchen PAROXETINE HCL 30 MG PO TABS Oral Take 30 mg by mouth every morning.    Marland Kitchen RANITIDINE HCL 150 MG PO TABS Oral Take 150 mg by mouth daily as needed. Acid reflux    . TRAMADOL  HCL 50 MG PO TABS Oral Take 50 mg by mouth every 6 (six) hours as needed. For pain    . WARFARIN SODIUM 5 MG PO TABS Oral Take 5 mg by mouth daily.      BP 131/73  Pulse 95  Temp(Src) 98.6 F (37 C) (Oral)  Resp 20  SpO2 93%  Physical Exam  Nursing note and vitals reviewed. Constitutional: She appears well-developed and well-nourished. No distress.  HENT:  Head: Normocephalic and atraumatic.  Eyes: Pupils are equal, round, and reactive to light.  Neck: Normal range of motion.  Cardiovascular: Normal rate and intact distal pulses.   Pulmonary/Chest: No respiratory distress.  Abdominal: Normal appearance. She exhibits no distension.  Musculoskeletal: Normal range of motion.  Neurological: She is alert. No cranial nerve deficit. Coordination normal.  Skin: Skin is warm and dry. No rash noted.  Psychiatric: She has a normal mood and affect. Her behavior is normal.    ED Course  Procedures (including critical care time)  Labs Reviewed  URINALYSIS, ROUTINE W REFLEX MICROSCOPIC - Abnormal; Notable for the following:    APPearance HAZY (*)  Hgb urine dipstick MODERATE (*)    Ketones, ur 15 (*)    Protein, ur 30 (*)    Nitrite POSITIVE (*)    Leukocytes, UA SMALL (*)    All other components within normal limits  CBC - Abnormal; Notable for the following:    WBC 17.6 (*)    Hemoglobin 10.4 (*)    HCT 32.3 (*)    MCV 75.5 (*)    MCH 24.3 (*)    All other components within normal limits  DIFFERENTIAL - Abnormal; Notable for the following:    Neutrophils Relative 80 (*)    Neutro Abs 14.0 (*)    Lymphocytes Relative 10 (*)    Monocytes Absolute 1.7 (*)    All other components within normal limits  PROTIME-INR - Abnormal; Notable for the following:    Prothrombin Time 34.6 (*)    INR 3.37 (*)    All other components within normal limits  COMPREHENSIVE METABOLIC PANEL - Abnormal; Notable for the following:    Potassium 2.9 (*)    Glucose, Bld 114 (*)    Albumin 3.0 (*)      GFR calc non Af Amer 67 (*)    GFR calc Af Amer 77 (*)    All other components within normal limits  URINE MICROSCOPIC-ADD ON - Abnormal; Notable for the following:    Bacteria, UA MANY (*)    Casts GRANULAR CAST (*) HYALINE CASTS   All other components within normal limits  URINE CULTURE   Dg Chest 2 View  06/07/2011  *RADIOLOGY REPORT*  Clinical Data: Fever.  Dizziness.  CHEST - 2 VIEW  Comparison: 09/23/2010  Findings: Upper normal heart size.  Low volumes.  Improved scattered bilateral atelectasis.  No pneumothorax.  No pleural effusion.  Stable T12 compression deformity. Chronic pleural changes at the left base.  Healing left-sided rib fractures.  IMPRESSION: Bilateral scattered atelectasis improved.  Healing left-sided rib fractures.  Original Report Authenticated By: Donavan Burnet, M.D.     1. UTI (lower urinary tract infection)   2. Hypokalemia       MDM          Nelia Shi, MD 06/07/11 1610

## 2011-06-07 NOTE — H&P (Signed)
[facility] History and Physical  [name] [name] ZOX:[id number] DOB: Sep 21, 1926 DOA: 06/07/2011    Chief Complaint: Patient was brought by family because of confusion.   HPI:  78 year old with PMH significant for A fib on Coumadin, COPD, prior history of cholecystectomy who was brought by family due to confusion, slurred speech, fever and episode of vomiting. Patient vomit the day of admission, dark color, no Bright red, no abdominal pain. She has prior history of frequent fall per records. Per family her last Fall was 2 Month, she was brought to hospital at that time. Family relates that she has not been falling that often. Patient is using a cane to ambulates.  Her confusion and slurred speech was notice when she had fever.  Patient awake oriented to person and place, not oriented  to year. She has history of Alzheimer.  She does relates dysuria.     Review of Systems:  Constitutional:  No weight loss, night sweats, Fevers, chills, fatigue.  HEENT:  No headaches, Difficulty swallowing,Tooth/dental problems,Sore throat,  No sneezing, itching, ear ache, nasal congestion, post nasal drip,  Cardio-vascular:  No chest pain, Orthopnea, PND, swelling in lower extremities, anasarca, dizziness, palpitations  GI:  No heartburn, indigestion, abdominal pain, , diarrhea, change in bowel habits, loss of appetite  Resp:  No shortness of breath with exertion or at rest. No excess mucus, no productive cough, No non-productive cough, No coughing up of blood.No change in color of mucus.No wheezing.No chest wall deformity  Skin:  no rash or lesions.  Musculoskeletal:  No joint pain or swelling. No decreased range of motion. No back pain.  Psych:  No change in mood or affect. No depression or anxiety.   Past Medical History  Diagnosis Date  . Atrial fib/flutter, transient   . COPD (chronic obstructive pulmonary disease)   . GERD (gastroesophageal reflux disease)   . Pain    Past  Surgical History  Procedure Date  . Nasal sinus surgery   . Cholecystectomy    Social History:  reports that she has never smoked. She does not have any smokeless tobacco history on file. She reports that she does not drink alcohol or use illicit drugs.  No Known Allergies  History reviewed. No pertinent family history.  Prior to Admission medications   Medication Sig Start Date End Date Taking? Authorizing Provider  budesonide-formoterol (SYMBICORT) 80-4.5 MCG/ACT inhaler Inhale 2 puffs into the lungs 2 (two) times daily.   Yes Historical Provider, MD  diltiazem (DILACOR XR) 120 MG 24 hr capsule Take 120 mg by mouth daily.   Yes Historical Provider, MD  donepezil (ARICEPT) 5 MG tablet Take 10 mg by mouth at bedtime.    Yes Historical Provider, MD  fluticasone (FLONASE) 50 MCG/ACT nasal spray Place 2 sprays into the nose daily.   Yes Historical Provider, MD  OVER THE COUNTER MEDICATION Take 1 tablet by mouth at bedtime as needed. Sleep aid   Yes Historical Provider, MD  pantoprazole (PROTONIX) 40 MG tablet Take 40 mg by mouth daily.   Yes Historical Provider, MD  PARoxetine (PAXIL) 30 MG tablet Take 30 mg by mouth every morning.   Yes Historical Provider, MD  ranitidine (ZANTAC) 150 MG tablet Take 150 mg by mouth daily as needed. Acid reflux   Yes Historical Provider, MD  traMADol (ULTRAM) 50 MG tablet Take 50 mg by mouth every 6 (six) hours as needed. For pain   Yes Historical Provider, MD  warfarin (COUMADIN) 5  MG tablet Take 5 mg by mouth daily.   Yes Historical Provider, MD   Physical Exam: Filed Vitals:   06/07/11 1252 06/07/11 1502  BP: 131/73   Pulse: 95   Temp: 97.8 F (36.6 C) 98.6 F (37 C)  TempSrc: Oral   Resp: 20   SpO2: 93%      General:  Patient awake, Oriented to person, place. In no acute distress.   Eyes: PERLA.  ENT: No tonsillar enlargement, no erythema. Mucosa membrane moist.   Neck: Supple, no thyromegaly, No JVD.   Cardiovascular: S1, S2, RRR, no  murmur or gallops.   Respiratory: CTA.   Abdomen: Bowel sounds presents, soft, Non tender, no distended, no rigidity.  Skin: No rash.   Musculoskeletal: No joint deformity, no effusion.   Neurologic: Alert and oriented times 2, Cranial nerve II to XII intact, sensation grossly intact, Motor strength :  5/5 through out.   Labs on Admission:  Basic Metabolic Panel:  Lab 06/07/11 1610  NA 137  K 2.9*  CL 100  CO2 25  GLUCOSE 114*  BUN 16  CREATININE 0.80  CALCIUM 9.0  MG --  PHOS --   Liver Function Tests:  Lab 06/07/11 1405  AST 13  ALT 8  ALKPHOS 83  BILITOT 0.4  PROT 7.3  ALBUMIN 3.0*   CBC:  Lab 06/07/11 1405  WBC 17.6*  NEUTROABS 14.0*  HGB 10.4*  HCT 32.3*  MCV 75.5*  PLT 321    Radiological Exams on Admission: Dg Chest 2 View  06/07/2011  *RADIOLOGY REPORT*  Clinical Data: Fever.  Dizziness.  CHEST - 2 VIEW  Comparison: 09/23/2010  Findings: Upper normal heart size.  Low volumes.  Improved scattered bilateral atelectasis.  No pneumothorax.  No pleural effusion.  Stable T12 compression deformity. Chronic pleural changes at the left base.  Healing left-sided rib fractures.  IMPRESSION: Bilateral scattered atelectasis improved.  Healing left-sided rib fractures.  Original Report Authenticated By: Donavan Burnet, M.D.    EKG: Rate 90, A fib.   Assessment/Plan: Patient Active Hospital Problem List:  UTI (lower urinary tract infection) (06/07/2011) Patient presents with fever, leukocytosis, dysuria, UA with positive nitrates. Will treat with Ceftriaxone. Will follow urine culture.   Leukocytosis (06/07/2011) Likely secondary to infection, UTI. I will check Blood culture. Patient started on Ceftriaxone.   Encephalopathy (06/07/2011) Patient with history of confusion, slurred speech. Per family patient was very lethargic night prior to admission and with slurred speech. She fell 2 months ago. She is on Coumadin. I will check CT head. I think confusion, and recent  changes likely secondary to fever, infection.   Supratherapeutic INR (06/07/2011) I will hold Coumadin due to history of dark vomits. Will need to discuss with family continuation of anticoagulation.   Hypokalemia (06/07/2011) Will replaced with IV 10 meq times 2 runs. She recieved 40 meq times 1.   Atrial fib/flutter, transient (06/07/2011) Hold coumadin. Will Continue with Cardizem.  Coffee ground emesis ?: Patient vomit, dark color. I will start protonix, hold coumadin. I will check Guaiac stool. Last Hb was at 11 on February. I will repeat hb. Clear diet. I will check anemia panel.       Hartley Barefoot, MD  [facility] Pager [phone]  If 7PM-7AM, please contact night-coverage [email or web address] Password [email or web address] 06/07/2011, 5:05 PM

## 2011-06-07 NOTE — ED Notes (Signed)
[id number] Ready 

## 2011-06-07 NOTE — ED Notes (Signed)
Pt going to ct in w/c with transporter.

## 2011-06-08 DIAGNOSIS — D7289 Other specified disorders of white blood cells: Secondary | ICD-10-CM

## 2011-06-08 DIAGNOSIS — E782 Mixed hyperlipidemia: Secondary | ICD-10-CM

## 2011-06-08 DIAGNOSIS — R197 Diarrhea, unspecified: Secondary | ICD-10-CM

## 2011-06-08 DIAGNOSIS — R1110 Vomiting, unspecified: Secondary | ICD-10-CM

## 2011-06-08 LAB — IRON AND TIBC: Iron: 14 ug/dL — ABNORMAL LOW (ref 42–135)

## 2011-06-08 LAB — BASIC METABOLIC PANEL
BUN: 14 mg/dL (ref 6–23)
Calcium: 8.2 mg/dL — ABNORMAL LOW (ref 8.4–10.5)
GFR calc non Af Amer: 76 mL/min — ABNORMAL LOW (ref >90)
Glucose, Bld: 108 mg/dL — ABNORMAL HIGH (ref 70–99)
Sodium: 141 mEq/L (ref 135–145)

## 2011-06-08 LAB — RETICULOCYTES: Retic Count, Absolute: 72.2 10*3/uL (ref 19.0–186.0)

## 2011-06-08 LAB — CBC
MCH: 24.7 pg — ABNORMAL LOW (ref 26.0–34.0)
MCHC: 31.9 g/dL (ref 30.0–36.0)
Platelets: 311 10*3/uL (ref 150–400)

## 2011-06-08 LAB — PROTIME-INR: Prothrombin Time: 43 seconds — ABNORMAL HIGH (ref 11.6–15.2)

## 2011-06-08 LAB — FOLATE: Folate: 6.2 ng/mL

## 2011-06-08 MED ORDER — PHYTONADIONE 5 MG PO TABS
5.0000 mg | ORAL_TABLET | Freq: Once | ORAL | Status: AC
  Administered 2011-06-08: 5 mg via ORAL
  Filled 2011-06-08: qty 1

## 2011-06-08 MED ORDER — POTASSIUM CHLORIDE 10 MEQ/100ML IV SOLN
10.0000 meq | INTRAVENOUS | Status: AC
  Administered 2011-06-08 (×3): 10 meq via INTRAVENOUS
  Filled 2011-06-08 (×3): qty 100

## 2011-06-08 MED ORDER — FUROSEMIDE 10 MG/ML IJ SOLN
20.0000 mg | Freq: Once | INTRAMUSCULAR | Status: AC
  Administered 2011-06-08: 20 mg via INTRAVENOUS
  Filled 2011-06-08: qty 2

## 2011-06-08 MED ORDER — ALBUTEROL SULFATE (5 MG/ML) 0.5% IN NEBU
2.5000 mg | INHALATION_SOLUTION | RESPIRATORY_TRACT | Status: DC | PRN
  Administered 2011-06-08 – 2011-06-09 (×2): 2.5 mg via RESPIRATORY_TRACT
  Filled 2011-06-08 (×2): qty 0.5

## 2011-06-08 MED ORDER — BUDESONIDE-FORMOTEROL FUMARATE 80-4.5 MCG/ACT IN AERO
2.0000 | INHALATION_SPRAY | Freq: Two times a day (BID) | RESPIRATORY_TRACT | Status: DC
  Administered 2011-06-08: 2 via RESPIRATORY_TRACT

## 2011-06-08 MED ORDER — POTASSIUM CHLORIDE CRYS ER 20 MEQ PO TBCR
40.0000 meq | EXTENDED_RELEASE_TABLET | Freq: Two times a day (BID) | ORAL | Status: AC
  Administered 2011-06-08 (×2): 40 meq via ORAL
  Filled 2011-06-08 (×2): qty 2

## 2011-06-08 NOTE — Consult Note (Addendum)
Referring Provider: Dr. Sunnie Nielsen Primary Care Physician:  No primary provider on file. Primary Gastroenterologist:  Dr. Ewing Schlein  Reason for Consultation:  Coffee grounds emesis  HPI: [name] [name] is a 78 y.o. female on chronic Coumadin for Afib admitted due to altered mental status and fever. She reports nausea yesterday and several episodes of vomiting with some coffee grounds noted. She is not sure if the first episode was black but thinks it happened after the first one. Denies any red blood in the vomitus. Denies abdominal pain, melena, hematochezia. Denies NSAIDs. Reports history of heartburn that is controlled with meds. Reports intermittent dizziness. Hgb 10.8 (11 in February) on admit with INR 3.37. Now Hgb 8.9 with INR 4.45. Found to have a UTI. Patient keeps saying that she wants to eat. Denies any history of peptic ulcer disease. She had an ERCP in 2012 for CBD stones. No further vomiting since admit and no melena.  Past Medical History  Diagnosis Date  . Atrial fib/flutter, transient   . COPD (chronic obstructive pulmonary disease)   . GERD (gastroesophageal reflux disease)   . Pain     Past Surgical History  Procedure Date  . Nasal sinus surgery   . Cholecystectomy     Prior to Admission medications   Medication Sig Start Date End Date Taking? Authorizing Provider  budesonide-formoterol (SYMBICORT) 80-4.5 MCG/ACT inhaler Inhale 2 puffs into the lungs 2 (two) times daily.   Yes Historical Provider, MD  diltiazem (DILACOR XR) 120 MG 24 hr capsule Take 120 mg by mouth daily.   Yes Historical Provider, MD  donepezil (ARICEPT) 5 MG tablet Take 10 mg by mouth at bedtime.    Yes Historical Provider, MD  fluticasone (FLONASE) 50 MCG/ACT nasal spray Place 2 sprays into the nose daily.   Yes Historical Provider, MD  OVER THE COUNTER MEDICATION Take 1 tablet by mouth at bedtime as needed. Sleep aid   Yes Historical Provider, MD  pantoprazole (PROTONIX) 40 MG tablet Take 40 mg by  mouth daily.   Yes Historical Provider, MD  PARoxetine (PAXIL) 30 MG tablet Take 30 mg by mouth every morning.   Yes Historical Provider, MD  ranitidine (ZANTAC) 150 MG tablet Take 150 mg by mouth daily as needed. Acid reflux   Yes Historical Provider, MD  traMADol (ULTRAM) 50 MG tablet Take 50 mg by mouth every 6 (six) hours as needed. For pain   Yes Historical Provider, MD  warfarin (COUMADIN) 5 MG tablet Take 5 mg by mouth daily.   Yes Historical Provider, MD    Scheduled Meds:   . acetaminophen  650 mg Oral Once  . budesonide-formoterol  2 puff Inhalation BID  . cefTRIAXone (ROCEPHIN)  IV  1 g Intravenous Once  . cefTRIAXone (ROCEPHIN)  IV  1 g Intravenous Q24H  . diltiazem  120 mg Oral Daily  . docusate sodium  100 mg Oral BID  . donepezil  10 mg Oral QHS  . fluticasone  2 spray Each Nare Daily  . iohexol  20 mL Oral Q1 Hr x 2  . ondansetron      . ondansetron  4 mg Intravenous Once  . pantoprazole (PROTONIX) IV  40 mg Intravenous Q12H  . PARoxetine  30 mg Oral q morning - 10a  . phytonadione  5 mg Oral Once  . potassium chloride  10 mEq Intravenous Q1 Hr x 2  . potassium chloride  10 mEq Intravenous Q1 Hr x 3  . potassium chloride  40  mEq Oral Once  . potassium chloride  40 mEq Oral BID  . promethazine  12.5 mg Intravenous NOW   Continuous Infusions:   . sodium chloride 75 mL/hr at 06/08/11 0943  . DISCONTD: sodium chloride 125 mL/hr at 06/07/11 1711   PRN Meds:.acetaminophen, acetaminophen, albuterol, ondansetron, traMADol, DISCONTD: ondansetron (ZOFRAN) IV  Allergies as of 06/07/2011  . (No Known Allergies)    History reviewed. No pertinent family history.  History   Social History  . Marital Status: Widowed    Spouse Name: N/A    Number of Children: N/A  . Years of Education: N/A   Occupational History  . Not on file.   Social History Main Topics  . Smoking status: Never Smoker   . Smokeless tobacco: Not on file  . Alcohol Use: No  . Drug Use: No  .  Sexually Active:    Other Topics Concern  . Not on file   Social History Narrative  . No narrative on file    Review of Systems: All negative except as stated above in HPI.  Physical Exam: Vital signs: Filed Vitals:   06/08/11 0941  BP: 177/84  Pulse: 80  Temp: 100.2  Resp: 18   Last BM Date: 06/06/11 General:   Alert,  Well-developed, well-nourished, pleasant and cooperative in NAD Lungs:  Clear throughout to auscultation.   No wheezes, crackles, or rhonchi. No acute distress. Heart:  Regular rate and rhythm; no murmurs, clicks, rubs,  or gallops. Abdomen: soft, NT, ND, +BS  Rectal:  Deferred  GI:  Lab Results:  Basename 06/08/11 0540 06/07/11 2024 06/07/11 1405  WBC 14.5* -- 17.6*  HGB 8.9* 10.8* 10.4*  HCT 27.9* 33.9* 32.3*  PLT 311 -- 321   BMET  Basename 06/08/11 0540 06/07/11 1405  NA 141 137  K 2.9* 2.9*  CL 107 100  CO2 26 25  GLUCOSE 108* 114*  BUN 14 16  CREATININE 0.77 0.80  CALCIUM 8.2* 9.0   LFT  Basename 06/07/11 1405  PROT 7.3  ALBUMIN 3.0*  AST 13  ALT 8  ALKPHOS 83  BILITOT 0.4  BILIDIR --  IBILI --   PT/INR  Basename 06/08/11 0540 06/07/11 1405  LABPROT 43.0* 34.6*  INR 4.45* 3.37*     Studies/Results: Dg Chest 2 View  06/07/2011  *RADIOLOGY REPORT*  Clinical Data: Fever.  Dizziness.  CHEST - 2 VIEW  Comparison: 09/23/2010  Findings: Upper normal heart size.  Low volumes.  Improved scattered bilateral atelectasis.  No pneumothorax.  No pleural effusion.  Stable T12 compression deformity. Chronic pleural changes at the left base.  Healing left-sided rib fractures.  IMPRESSION: Bilateral scattered atelectasis improved.  Healing left-sided rib fractures.  Original Report Authenticated By: Donavan Burnet, M.D.   Ct Head Wo Contrast  06/07/2011  *RADIOLOGY REPORT*  Clinical Data: Altered level of consciousness with confusion. Supratherapeutic INR.  CT HEAD WITHOUT CONTRAST  Technique:  Contiguous axial images were obtained from the  base of the skull through the vertex without contrast.  Comparison: 03/24/2011 CT most recent, also 03/02/11 MR.  Findings: The initial images were motion degraded but the repeat images obtained are diagnostic.  There is no evidence for acute infarction, intracranial hemorrhage, mass lesion, hydrocephalus, or extra-axial fluid.   Moderate to severe atrophy is present.  Advanced chronic microvascular ischemic change is present in the periventricular and subcortical white matter.  Vascular calcification.  Calvarium intact.  No acute sinus or mastoid fluid.  Similar appearance to priors.  IMPRESSION: Atrophy and chronic microvascular ischemic change.  No acute stroke or bleed.  Original Report Authenticated By: Elsie Stain, M.D.   Dg Abd Portable 2v  06/07/2011  *RADIOLOGY REPORT*  Clinical Data:   Nausea and vomiting.  PORTABLE ABDOMEN - 2 VIEW  Comparison: None.  Findings: Right-sided decubitus view of the abdomen shows no evidence for intraperitoneal free air.  Supine film shows no gaseous bowel dilatation to suggest obstruction.  IVC filter identified in situ.  Diffuse degenerative changes are noted in the lumbar spine the patient is status post left hip hemiarthroplasty.  IMPRESSION: No evidence for bowel perforation or obstruction.  Original Report Authenticated By: ERIC A. MANSELL, M.D.    Impression/Plan: 78yo with coffee grounds emesis yesterday in the setting of a UTI on chronic coumadin with an INR of 4.45 now. No evidence of active bleeding. Suspect mucosal bleed from elevated INR for coffee grounds emesis. Agree with correcting INR and IV PPI Q 12 hours. Would give FFP in addition to Vit K and Dr. Sunnie Nielsen aware and she will order. Full liquids. NPO after MN in case EGD needed. Since coumadin likely will be started again at d/c, then may need an EGD in next 1-2 days after correction of INR to look for an ulcer.   LOS: 1 day   Anacristina Steffek C.  06/08/2011, 9:43 AM

## 2011-06-08 NOTE — Progress Notes (Signed)
Pt's potassium is still 2.9 this am, Dr. Sunnie Nielsen was notified through [email or web address]. Awaiting a call back, will pass it off to the on-coming am rn.--------Passion Lavin, D. rn

## 2011-06-08 NOTE — Care Management Note (Signed)
    Page 1 of 1   06/13/2011     7:08:52 PM   CARE MANAGEMENT NOTE 06/13/2011  Patient:  [name] [name], [name] [name]   Account Number:  [id number]  Date Initiated:  06/08/2011  Documentation initiated by:  GRAVES-BIGELOW,BRENDA  Subjective/Objective Assessment:   Pt admitted with AMS and fever. + for UTI on IV rocephin. PT/OT is following pt for disposition needs.     Action/Plan:   Anticipated DC Date:  06/13/2011   Anticipated DC Plan:  SKILLED NURSING FACILITY  In-house referral  Clinical Social Worker      DC Planning Services  CM consult      Choice offered to / List presented to:             Status of service:  Completed, signed off Medicare Important Message given?   (If response is "NO", the following Medicare IM given date fields will be blank) Date Medicare IM given:   Date Additional Medicare IM given:    Discharge Disposition:  SKILLED NURSING FACILITY  Per UR Regulation:  Reviewed for med. necessity/level of care/duration of stay  If discussed at Long Length of Stay Meetings, dates discussed:    Comments:  06/13/11 19:07 Letha Cape RN, BSN [phone] patient dc to snf.  06/09/11- 1000- Donn Pierini RN, BSN [phone] Pts niece here and has concerns about pts home environment- both CSW and CM spoke with niece and pt (pt unable at this time to fully participate in conversation). Per the conversation, pt's niece Daphene Calamity- [phone], voiced concerns regarding pt returning home with pts son, Annette Stable. PT/OT evals are pending. Pt has been at [facility] in the past. Will await evals and see how pt progresses. Will also contact pts son/daughter/n/law who are in the home. CSW to follow along. Alternate contact- June Gali (cousin) [phone]   06-08-11 1657 Tomi Bamberger, RN,BSN [phone] CM will continue to monitor for disposition needs.

## 2011-06-08 NOTE — Progress Notes (Signed)
UR Completed Dezi Brauner Graves-Bigelow, RN,BSN [phone]  

## 2011-06-08 NOTE — Progress Notes (Signed)
Subjective: Patient alert and oriented times 2. No complaints. She wants eat. She denies abdominal pain. No Nausea.  She refuse rectal exam.  I spoke with nurse, made her aware that we need Guaiac stool.  Objective: Filed Vitals:   06/07/11 2100 06/08/11 0010 06/08/11 0400 06/08/11 0747  BP: 152/83 148/71 154/97   Pulse: 85 80 85   Temp: 99.2 F (37.3 C) 99 F (37.2 C) 100.2 F (37.9 C)   TempSrc:      Resp: 18 18 18    SpO2: 94% 97% 91% 90%   Weight change:    General: Alert, awake, oriented x3, in no acute distress.  HEENT: No bruits, no goiter.  Heart: Regular rate and rhythm, without murmurs, rubs, gallops.  Lungs: CTA , bilateral air movement.  Abdomen: Soft, nontender, nondistended, positive bowel sounds.  Neuro: Grossly intact, nonfocal. Extremities: no edema.    Lab Results:  [facility] 06/08/11 0540 06/07/11 1405  NA 141 137  K 2.9* 2.9*  CL 107 100  CO2 26 25  GLUCOSE 108* 114*  BUN 14 16  CREATININE 0.77 0.80  CALCIUM 8.2* 9.0  MG -- --  PHOS -- --    Basename 06/07/11 1405  AST 13  ALT 8  ALKPHOS 83  BILITOT 0.4  PROT 7.3  ALBUMIN 3.0*    Basename 06/08/11 0540 06/07/11 2024 06/07/11 1405  WBC 14.5* -- 17.6*  NEUTROABS -- -- 14.0*  HGB 8.9* 10.8* --  HCT 27.9* 33.9* --  MCV 77.3* -- 75.5*  PLT 311 -- 321    Basename 06/08/11 0540  VITAMINB12 --  FOLATE --  FERRITIN --  TIBC --  IRON --  RETICCTPCT 2.0    Micro Results: No results found for this or any previous visit (from the past 240 hour(s)).  Studies/Results: Dg Chest 2 View  06/07/2011  *RADIOLOGY REPORT*  Clinical Data: Fever.  Dizziness.  CHEST - 2 VIEW  Comparison: 09/23/2010  Findings: Upper normal heart size.  Low volumes.  Improved scattered bilateral atelectasis.  No pneumothorax.  No pleural effusion.  Stable T12 compression deformity. Chronic pleural changes at the left base.  Healing left-sided rib fractures.  IMPRESSION: Bilateral scattered atelectasis improved.   Healing left-sided rib fractures.  Original Report Authenticated By: Donavan Burnet, M.D.   Ct Head Wo Contrast  06/07/2011  *RADIOLOGY REPORT*  Clinical Data: Altered level of consciousness with confusion. Supratherapeutic INR.  CT HEAD WITHOUT CONTRAST  Technique:  Contiguous axial images were obtained from the base of the skull through the vertex without contrast.  Comparison: 03/24/2011 CT most recent, also 03/02/11 MR.  Findings: The initial images were motion degraded but the repeat images obtained are diagnostic.  There is no evidence for acute infarction, intracranial hemorrhage, mass lesion, hydrocephalus, or extra-axial fluid.   Moderate to severe atrophy is present.  Advanced chronic microvascular ischemic change is present in the periventricular and subcortical white matter.  Vascular calcification.  Calvarium intact.  No acute sinus or mastoid fluid.  Similar appearance to priors.  IMPRESSION: Atrophy and chronic microvascular ischemic change.  No acute stroke or bleed.  Original Report Authenticated By: Elsie Stain, M.D.   Dg Abd Portable 2v  06/07/2011  *RADIOLOGY REPORT*  Clinical Data:   Nausea and vomiting.  PORTABLE ABDOMEN - 2 VIEW  Comparison: None.  Findings: Right-sided decubitus view of the abdomen shows no evidence for intraperitoneal free air.  Supine film shows no gaseous bowel dilatation to suggest obstruction.  IVC filter identified  in situ.  Diffuse degenerative changes are noted in the lumbar spine the patient is status post left hip hemiarthroplasty.  IMPRESSION: No evidence for bowel perforation or obstruction.  Original Report Authenticated By: ERIC A. MANSELL, M.D.    Medications: I have reviewed the patient's current medications.  UTI (lower urinary tract infection) (06/07/2011) Patient presents with fever, leukocytosis, dysuria, UA with positive nitrates. Continue with  Ceftriaxone day 2.  Will follow urine culture.   Leukocytosis (06/07/2011) Likely secondary to  infection, UTI. I will check Blood culture. Patient started on Ceftriaxone.  WBC trending down.   Encephalopathy (06/07/2011) Patient with history of confusion, slurred speech. Per family patient was very lethargic night prior to admission and with slurred speech. She fell 2 months ago. She is on Coumadin.  CT head negative for bleed or stroke. I think confusion, and recent changes likely secondary to fever, infection.   Supratherapeutic INR (06/07/2011) I will hold Coumadin due to history of dark vomits. I will give 1 dose vitamin K.   Hypokalemia (06/07/2011) Will replaced with IV 10 meq times 3 runs. Will also give  40 meq times 2.    Atrial fib/flutter, transient (06/07/2011) Hold coumadin. Will Continue with Cardizem.   Coffee ground emesis: Patient vomit, dark color. Continue with  protonix, hold coumadin. I will check Guaiac stool. Last Hb was at 11 on February. Hb decreases. Anemia panel ordered. Repeat Hb later tonight. Maybe mucosal bleed. I will consult GI due to anemia, Coffee ground emesis, and patient on Coumadin. Patient refuse Rectal exam.         LOS: 1 day   [name] [name] M.D.  [facility] 06/08/2011, 9:05 AM

## 2011-06-09 ENCOUNTER — Inpatient Hospital Stay (HOSPITAL_COMMUNITY): Payer: Medicare Other

## 2011-06-09 ENCOUNTER — Encounter (HOSPITAL_COMMUNITY): Payer: Self-pay | Admitting: Radiology

## 2011-06-09 DIAGNOSIS — T7601XA Adult neglect or abandonment, suspected, initial encounter: Secondary | ICD-10-CM

## 2011-06-09 DIAGNOSIS — R197 Diarrhea, unspecified: Secondary | ICD-10-CM

## 2011-06-09 DIAGNOSIS — K920 Hematemesis: Secondary | ICD-10-CM | POA: Diagnosis present

## 2011-06-09 DIAGNOSIS — R1110 Vomiting, unspecified: Secondary | ICD-10-CM

## 2011-06-09 DIAGNOSIS — R1310 Dysphagia, unspecified: Secondary | ICD-10-CM | POA: Diagnosis present

## 2011-06-09 DIAGNOSIS — J690 Pneumonitis due to inhalation of food and vomit: Secondary | ICD-10-CM | POA: Diagnosis present

## 2011-06-09 DIAGNOSIS — K922 Gastrointestinal hemorrhage, unspecified: Secondary | ICD-10-CM | POA: Diagnosis present

## 2011-06-09 DIAGNOSIS — E782 Mixed hyperlipidemia: Secondary | ICD-10-CM

## 2011-06-09 DIAGNOSIS — D509 Iron deficiency anemia, unspecified: Secondary | ICD-10-CM | POA: Diagnosis present

## 2011-06-09 DIAGNOSIS — J9601 Acute respiratory failure with hypoxia: Secondary | ICD-10-CM | POA: Diagnosis present

## 2011-06-09 DIAGNOSIS — J9600 Acute respiratory failure, unspecified whether with hypoxia or hypercapnia: Secondary | ICD-10-CM

## 2011-06-09 LAB — BLOOD GAS, ARTERIAL
Bicarbonate: 28.3 mEq/L — ABNORMAL HIGH (ref 20.0–24.0)
O2 Saturation: 93.4 %
Patient temperature: 99.1
TCO2: 29.6 mmol/L (ref 0–100)
pH, Arterial: 7.431 — ABNORMAL HIGH (ref 7.350–7.400)
pO2, Arterial: 61.5 mmHg — ABNORMAL LOW (ref 80.0–100.0)

## 2011-06-09 LAB — URINE CULTURE
Colony Count: >=100000
Culture  Setup Time: 201305071547

## 2011-06-09 LAB — CARDIAC PANEL(CRET KIN+CKTOT+MB+TROPI)
CK, MB: 2.2 ng/mL (ref 0.3–4.0)
CK, MB: 1.7 ng/mL (ref 0.3–4.0)
Relative Index: 2 (ref 0.0–2.5)
Troponin I: <0.3 ng/mL (ref <0.30)
Troponin I: <0.3 ng/mL (ref <0.30)
Troponin I: <0.3 ng/mL (ref <0.30)

## 2011-06-09 LAB — BASIC METABOLIC PANEL
BUN: 9 mg/dL (ref 6–23)
Calcium: 8.6 mg/dL (ref 8.4–10.5)
GFR calc non Af Amer: 81 mL/min — ABNORMAL LOW (ref >90)
Glucose, Bld: 138 mg/dL — ABNORMAL HIGH (ref 70–99)
Potassium: 3.1 mEq/L — ABNORMAL LOW (ref 3.5–5.1)

## 2011-06-09 LAB — PREPARE FRESH FROZEN PLASMA: Unit division: 0

## 2011-06-09 LAB — CBC
HCT: 28.3 % — ABNORMAL LOW (ref 36.0–46.0)
Hemoglobin: 8.9 g/dL — ABNORMAL LOW (ref 12.0–15.0)
MCH: 24.5 pg — ABNORMAL LOW (ref 26.0–34.0)
MCHC: 31.4 g/dL (ref 30.0–36.0)
MCV: 78 fL (ref 78.0–100.0)

## 2011-06-09 LAB — MRSA PCR SCREENING: MRSA by PCR: NEGATIVE

## 2011-06-09 MED ORDER — FUROSEMIDE 10 MG/ML IJ SOLN
20.0000 mg | INTRAMUSCULAR | Status: AC
  Administered 2011-06-09: 20 mg via INTRAVENOUS
  Filled 2011-06-09: qty 2

## 2011-06-09 MED ORDER — METOPROLOL TARTRATE 1 MG/ML IV SOLN
INTRAVENOUS | Status: AC
  Filled 2011-06-09: qty 5

## 2011-06-09 MED ORDER — LEVOFLOXACIN IN D5W 750 MG/150ML IV SOLN
750.0000 mg | Freq: Once | INTRAVENOUS | Status: AC
  Administered 2011-06-09: 750 mg via INTRAVENOUS
  Filled 2011-06-09: qty 150

## 2011-06-09 MED ORDER — SODIUM CHLORIDE 0.9 % IV SOLN: INTRAVENOUS | Status: DC

## 2011-06-09 MED ORDER — LEVALBUTEROL HCL 0.63 MG/3ML IN NEBU
0.6300 mg | INHALATION_SOLUTION | Freq: Three times a day (TID) | RESPIRATORY_TRACT | Status: DC | PRN
  Administered 2011-06-13: 0.63 mg via RESPIRATORY_TRACT
  Filled 2011-06-09 (×2): qty 3

## 2011-06-09 MED ORDER — METOPROLOL TARTRATE 1 MG/ML IV SOLN: 5.0000 mg | Freq: Once | INTRAVENOUS | Status: DC

## 2011-06-09 MED ORDER — FUROSEMIDE 10 MG/ML IJ SOLN: 20.0000 mg | Freq: Once | INTRAMUSCULAR | Status: DC

## 2011-06-09 MED ORDER — LEVOFLOXACIN IN D5W 500 MG/100ML IV SOLN
500.0000 mg | INTRAVENOUS | Status: DC
  Administered 2011-06-10: 500 mg via INTRAVENOUS
  Filled 2011-06-09 (×2): qty 100

## 2011-06-09 MED ORDER — IPRATROPIUM BROMIDE 0.02 % IN SOLN
0.5000 mg | Freq: Four times a day (QID) | RESPIRATORY_TRACT | Status: DC | PRN
  Administered 2011-06-13: 0.5 mg via RESPIRATORY_TRACT

## 2011-06-09 MED ORDER — FOOD THICKENER (THICKENUP CLEAR)
ORAL | Status: DC | PRN
  Filled 2011-06-09: qty 120

## 2011-06-09 MED ORDER — DILTIAZEM HCL ER 180 MG PO CP24
180.0000 mg | ORAL_CAPSULE | Freq: Every day | ORAL | Status: DC
  Administered 2011-06-09 – 2011-06-13 (×5): 180 mg via ORAL
  Filled 2011-06-09 (×7): qty 1

## 2011-06-09 NOTE — Evaluation (Signed)
Clinical/Bedside Swallow Evaluation Patient Details  Name: [name] [name] MRN: [id number] Date of Birth: [birth year]  Today's Date: 06/09/2011 Time: 1130-1150 SLP Time Calculation (min): 20 min  Past Medical History:  Past Medical History  Diagnosis Date  . Atrial fib/flutter, transient   . COPD (chronic obstructive pulmonary disease)   . GERD (gastroesophageal reflux disease)   . Pain    Past Surgical History:  Past Surgical History  Procedure Date  . Nasal sinus surgery   . Cholecystectomy    HPI:  Ms Clemence is an elderly WF came to ED for lethargy, speech changes and confusion noted by her family the day of admission. She was noted to have leukocytosis and UTI. She had vomited at home as well. Her INR was supratherapeutic and Coumadin held (hx Afib on Cardizem). While here, she began to vomit more with evidence of coffee-ground emesis. GI was consulted and plans are to do EGD for suspected ulcer when pt medically ready. Recently pt has become more lethargic and EGD is on hold. MD notes that pt may have clear liquids but a swallow eval was ordered to determine pts safety prior to allowing liquids.    Assessment / Plan / Recommendation Clinical Impression  Pt with overt s/s ofa aspiration with thin and nectar thick lqiuids with sluggish swallow response, anterior spillage. Pt tolerated honey thick lqiuids well. Will initiate clear, honey thick liquids only (no solids per GI) and progress as pts status improves.     Aspiration Risk  Moderate    Diet Recommendation Honey-thick liquid   Other  Recommendations     Follow Up Recommendations  Skilled Nursing facility    Frequency and Duration min 2x/week  2 weeks   Pertinent Vitals/Pain NA    SLP Swallow Goals Patient will consume recommended diet without observed clinical signs of aspiration with: Minimal assistance Patient will utilize recommended strategies during swallow to increase swallowing safety with: Moderate  cueing   Swallow Study Prior Functional Status       General HPI: Ms Harms is an elderly WF came to ED for lethargy, speech changes and confusion noted by her family the day of admission. She was noted to have leukocytosis and UTI. She had vomited at home as well. Her INR was supratherapeutic and Coumadin held (hx Afib on Cardizem). While here, she began to vomit more with evidence of coffee-ground emesis. GI was consulted and plans are to do EGD for suspected ulcer when pt medically ready. Recently pt has become more lethargic and EGD is on hold. MD notes that pt may have clear liquids but a swallow eval was ordered to determine pts safety prior to allowing liquids.  Type of Study: Bedside swallow evaluation Diet Prior to this Study: NPO Temperature Spikes Noted: Yes Respiratory Status: Supplemental O2 delivered via (comment) History of Intubation: No Oral Cavity - Dentition: Dentures, top;Dentures, not available Vision: Functional for self-feeding Patient Positioning: Upright in bed Baseline Vocal Quality: Breathy Volitional Cough: Weak Volitional Swallow: Unable to elicit    Oral/Motor/Sensory Function Overall Oral Motor/Sensory Function: Impaired Labial ROM: Other (Comment) (reduced bottom)   Ice Chips Ice chips: Within functional limits   Thin Liquid Thin Liquid: Impaired Presentation: Cup;Straw Oral Phase Impairments: Reduced labial seal Oral Phase Functional Implications: Right anterior spillage;Left anterior spillage;Prolonged oral transit Pharyngeal  Phase Impairments: Delayed Swallow;Decreased hyoid-laryngeal movement;Cough - Immediate    Nectar Thick Nectar Thick Liquid: Impaired Presentation: Cup Oral phase functional implications: Prolonged oral transit Pharyngeal Phase Impairments: Delayed Swallow;Decreased  hyoid-laryngeal movement;Throat Clearing - Immediate   Honey Thick Honey Thick Liquid: Impaired Oral Phase Functional Implications: Prolonged oral  transit Pharyngeal Phase Impairments: Decreased hyoid-laryngeal movement;Delayed Swallow   Puree Puree: Not tested Other Comments: clear liquids only   Solid Solid: Not tested    Oreoluwa Aigner, [name] 06/09/2011,11:58 AM

## 2011-06-09 NOTE — Progress Notes (Signed)
OT Cancellation Note  Treatment cancelled today due to medical issues with patient which prohibited therapy--moved to step down unit earlier this AM and currently on bed rest.  Evette Georges [phone] 06/09/2011, 10:32 AM

## 2011-06-09 NOTE — Progress Notes (Signed)
Around 0300 patient converted to afib with HR 100-130's. Patient denies symptoms. BP 148/82 with sats 92% 2L. 12 lead EKG done confirming conversion. Donnamarie Poag NP notified and orders received. After pulling medication and retaking bp 170/80 patient spontaneously converted to SR with HR 90-100's. Will hold Lopressor and cont to monitor.
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
## 2011-06-09 NOTE — Progress Notes (Signed)
PT Cancellation Note  Treatment cancelled today due to medical issues with patient which prohibited therapy- moved to step down unit earlier this AM and currently on bed rest.   Milana Kidney 06/09/2011, 12:17 PM  06/09/2011 Milana Kidney DPT PAGER: [phone] OFFICE: [phone]
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
## 2011-06-09 NOTE — Progress Notes (Signed)
Patient ID: [name] [name], female   DOB: 1927-01-08, 78 y.o.   MRN: [id number]  HPI: [name] [name] is an elderly WF came to ED for lethargy, speech changes and confusion noted by her family the day of admission. She was noted to have leukocytosis and UTI. She had vomited at home as well. Her INR was supratherapeutic and Coumadin held (hx Afib on Cardizem). While here, she began to vomit more with evidence of coffee-ground emesis. GI was consulted and plans are to do EGD in a.m. For suspected ulcer.  However, tonight RN called this NP 2/2 pt converting to Afib with HR 100-130. NP ordered Lopressor IV, but by the time the RN brought it to the room, the pt reverted to NSR. NP to bedside. S: [name] [name] is not able to participate in a thorough ROS 2/2 her mental status.  O: VS reviewed. BP slightly elevated. O2 sat 92% on 2L [place]. Elderly WF lying in bed. Not toxic appearing but not well appearing either. She is lethargic, but arousable with name or tactile stimulation. However, she immediately falls asleep. She is not oriented tonight. Card: at present, RRR. Lungs: slight increase in work of breathing. Crackles at bases with diminished lung sounds throughout. Abd is soft, ND, NT. MOE x 4. No focal neuro deficit noted except for change in [name]. Skin is grayish, but warm and dry.  A/P: 1. Change in mental status-This NP saw this lady on Monday and she was alert and conversational, so I have seen first hand the change in her status. Will get CT head. CT head upon admission was neg for acute. ABG with low PO2 but when I look in records, her ABG tonight looks almost identical to ones drawn before. Will cycle cardiac enzymes.  2. adventicious lung sounds-Lasix 20 IV given for suspected pulmonary edema on CXR (she had FFP and blood today). Awaiting final read. Cont O2 and advance O2 if sats fall. May need mask.  3. Coffee ground emesis-none during this event. For EGD today, ? Postponement. Cont to hold Coumadin.  I can't really put  my finger on her condition or exact cause of change in [name]. However, I think it is prudent to move her to step down for closer observation today. I discussed this with Dr. Adela Glimpse who agrees.  Maren Reamer, NP [facility]

## 2011-06-09 NOTE — Progress Notes (Signed)
Patient with continued lethargy and periods of labored breathing. Patient with dusky look. VSS and sats 92-94% on 2L [place]. K Kirby NP up to see patient for previous afib and also not feeling patient looks well. ABG and pCXR done and Craige Cotta NP aware of results. Orders to go to step down for closer observation. Patient taken down for CT of head via bed with monitor. Report then called to 3300 and patient transported via bed and monitor. BP 168/100 HR 90's and sat 95% on 2L and RR 22.

## 2011-06-09 NOTE — Progress Notes (Signed)
[name] [name] 10:01 AM  Subjective: Patient moved to step down for labored breathing and alternatived mental status however without signs of active bleeding and no GI complaint. She had some coffee ground emesis and slightly dark stools while her INR was too high but currently has no GI complaint Objective: Vital signs stable afebrile she is still not at her baseline per relative at bedside abdomen is soft nontender hemoglobin unchanged white count slightly elevated BUN decreased Assessment: Alternating status breathing problem increased white count and seemingly subacute GI bleeding  Plan: Will continue to follow and after medical evaluation will allow clear liquids continue pump inhibitors watch for further bleeding and proceed with endoscopy when necessary but okay to hold for now and await above workup for above problems  [name] [name] E

## 2011-06-09 NOTE — Consult Note (Signed)
ANTIBIOTIC CONSULT NOTE - INITIAL  Pharmacy Consult for Levaquin Indication: Klebsiella UTI, presumed Asp PNA  No Known Allergies  Patient Measurements: Height: 5\' 6"  (167.6 cm) Weight: 166 lb 10.7 oz (75.6 kg) IBW/kg (Calculated) : 59.3    Vital Signs: Temp: 99.4 F (37.4 C) (05/09 0642) Temp src: Oral (05/09 0400) BP: 168/100 mmHg (05/09 0642) Pulse Rate: 89  (05/09 0642) Intake/Output from previous day: 05/08 0701 - 05/09 0700 In: 3125 [P.O.:720; I.V.:1100; Blood:635] Out: 800 [Urine:800] Intake/Output from this shift:    Labs:  Basename 06/09/11 0540 06/08/11 2039 06/08/11 0540 06/07/11 1405  WBC 17.0* -- 14.5* 17.6*  HGB 8.9* 8.6* 8.9* --  PLT 304 -- 311 321  LABCREA -- -- -- --  CREATININE 0.64 -- 0.77 0.80   Estimated Creatinine Clearance: 56.3 ml/min (by C-G formula based on Cr of 0.64).  Microbiology: Recent Results (from the past 720 hour(s))  URINE CULTURE     Status: Normal   Collection Time   06/07/11  3:07 PM      Component Value Range Status Comment   Specimen Description URINE, CATHETERIZED   Final    Special Requests NONE   Final    Culture  Setup Time [id number]   Final    Colony Count >=100,000 COLONIES/ML   Final    Culture KLEBSIELLA PNEUMONIAE   Final    Report Status 06/09/2011 FINAL   Final    Organism ID, Bacteria KLEBSIELLA PNEUMONIAE   Final   CULTURE, BLOOD (ROUTINE X 2)     Status: Normal (Preliminary result)   Collection Time   06/07/11  8:10 PM      Component Value Range Status Comment   Specimen Description BLOOD LEFT ARM   Final    Special Requests BOTTLES DRAWN AEROBIC ONLY 10CC   Final    Culture  Setup Time [id number]   Final    Culture     Final    Value:        BLOOD CULTURE RECEIVED NO GROWTH TO DATE CULTURE WILL BE HELD FOR 5 DAYS BEFORE ISSUING A FINAL NEGATIVE REPORT   Report Status PENDING   Incomplete   CULTURE, BLOOD (ROUTINE X 2)     Status: Normal (Preliminary result)   Collection Time   06/07/11  8:20 PM   Component Value Range Status Comment   Specimen Description BLOOD LEFT ARM   Final    Special Requests BOTTLES DRAWN AEROBIC ONLY 10CC LEFT LATERAL ARM   Final    Culture  Setup Time [id number]   Final    Culture     Final    Value:        BLOOD CULTURE RECEIVED NO GROWTH TO DATE CULTURE WILL BE HELD FOR 5 DAYS BEFORE ISSUING A FINAL NEGATIVE REPORT   Report Status PENDING   Incomplete   MRSA PCR SCREENING     Status: Normal   Collection Time   06/09/11  9:14 AM      Component Value Range Status Comment   MRSA by PCR NEGATIVE  NEGATIVE  Final     Medical History: Past Medical History  Diagnosis Date  . Atrial fib/flutter, transient   . COPD (chronic obstructive pulmonary disease)   . GERD (gastroesophageal reflux disease)   . Pain     Medications:  Scheduled:    . budesonide-formoterol  2 puff Inhalation BID  . diltiazem  180 mg Oral Daily  . docusate sodium  100 mg Oral BID  .  donepezil  10 mg Oral QHS  . fluticasone  2 spray Each Nare Daily  . furosemide  20 mg Intravenous Once  . furosemide  20 mg Intravenous NOW  . levofloxacin (LEVAQUIN) IV  750 mg Intravenous Once  . metoprolol      . pantoprazole (PROTONIX) IV  40 mg Intravenous Q12H  . PARoxetine  30 mg Oral q morning - 10a  . potassium chloride  10 mEq Intravenous Q1 Hr x 3  . potassium chloride  40 mEq Oral BID  . DISCONTD: budesonide-formoterol  2 puff Inhalation BID  . DISCONTD: cefTRIAXone (ROCEPHIN)  IV  1 g Intravenous Q24H  . DISCONTD: diltiazem  120 mg Oral Daily  . DISCONTD: furosemide  20 mg Intravenous Once  . DISCONTD: metoprolol  5 mg Intravenous Once   Infusions:    . sodium chloride    . DISCONTD: sodium chloride 50 mL/hr at 06/08/11 1700   Anti-infectives     Start     Dose/Rate Route Frequency Ordered Stop   06/09/11 1200   levofloxacin (LEVAQUIN) IVPB 750 mg        750 mg 100 mL/hr over 90 Minutes Intravenous  Once 06/09/11 1051     06/08/11 0600   cefTRIAXone (ROCEPHIN) 1 g in  dextrose 5 % 50 mL IVPB  Status:  Discontinued        1 g 100 mL/hr over 30 Minutes Intravenous Every 24 hours 06/07/11 1659 06/09/11 1317   06/07/11 1615   cefTRIAXone (ROCEPHIN) 1 g in dextrose 5 % 50 mL IVPB        1 g 100 mL/hr over 30 Minutes Intravenous  Once 06/07/11 1608 06/07/11 1650          Assessment:  78 y/o female previously on Ceftriaxone for Klebsiella UTI.   Overnight, patient developed labored breathing and altered mental status presumed to be the result of a new Asp PNA.  WBC's elevated.  Patient experienced some coffee ground emesis and darkened stools.  Est Crcl ~ 60 ml/min.  Levaquin ordered for added PNA coverage.  She received one dose Levaquin 750 mg x 1.  Goal of Therapy:   Resolution of UTI and presumed PNA  Pd PNAlan:   Levaquin 500 mg IV q 24 hours, first dose 5/10.  Follow renal function, culture results.  Rihaan Barrack, Elisha Headland, Pharm.D. 06/09/2011 1:30 PM

## 2011-06-10 DIAGNOSIS — K920 Hematemesis: Secondary | ICD-10-CM

## 2011-06-10 DIAGNOSIS — R197 Diarrhea, unspecified: Secondary | ICD-10-CM

## 2011-06-10 DIAGNOSIS — E782 Mixed hyperlipidemia: Secondary | ICD-10-CM

## 2011-06-10 DIAGNOSIS — I059 Rheumatic mitral valve disease, unspecified: Secondary | ICD-10-CM

## 2011-06-10 DIAGNOSIS — J9600 Acute respiratory failure, unspecified whether with hypoxia or hypercapnia: Secondary | ICD-10-CM

## 2011-06-10 LAB — DIFFERENTIAL
Basophils Absolute: 0 10*3/uL (ref 0.0–0.1)
Basophils Relative: 0 % (ref 0–1)
Eosinophils Absolute: 0.1 10*3/uL (ref 0.0–0.7)
Neutro Abs: 13.4 10*3/uL — ABNORMAL HIGH (ref 1.7–7.7)
Neutrophils Relative %: 84 % — ABNORMAL HIGH (ref 43–77)

## 2011-06-10 LAB — BASIC METABOLIC PANEL
BUN: 14 mg/dL (ref 6–23)
Chloride: 96 mEq/L (ref 96–112)
GFR calc Af Amer: >90 mL/min (ref >90)
GFR calc non Af Amer: 79 mL/min — ABNORMAL LOW (ref >90)
Potassium: 3.2 mEq/L — ABNORMAL LOW (ref 3.5–5.1)
Sodium: 137 mEq/L (ref 135–145)

## 2011-06-10 LAB — CBC
Hemoglobin: 9.1 g/dL — ABNORMAL LOW (ref 12.0–15.0)
MCH: 24.2 pg — ABNORMAL LOW (ref 26.0–34.0)
MCHC: 30.8 g/dL (ref 30.0–36.0)
Platelets: 306 10*3/uL (ref 150–400)
RDW: 15.2 % (ref 11.5–15.5)

## 2011-06-10 LAB — PROTIME-INR
INR: 1.35 (ref 0.00–1.49)
Prothrombin Time: 16.9 seconds — ABNORMAL HIGH (ref 11.6–15.2)

## 2011-06-10 MED ORDER — FUROSEMIDE 10 MG/ML IJ SOLN
40.0000 mg | Freq: Two times a day (BID) | INTRAMUSCULAR | Status: DC
  Administered 2011-06-10 – 2011-06-12 (×4): 40 mg via INTRAVENOUS
  Filled 2011-06-10 (×6): qty 4

## 2011-06-10 MED ORDER — FUROSEMIDE 10 MG/ML IJ SOLN
40.0000 mg | Freq: Once | INTRAMUSCULAR | Status: AC
  Administered 2011-06-10: 40 mg via INTRAVENOUS
  Filled 2011-06-10: qty 4

## 2011-06-10 MED ORDER — METOPROLOL TARTRATE 25 MG PO TABS
25.0000 mg | ORAL_TABLET | Freq: Two times a day (BID) | ORAL | Status: DC
  Administered 2011-06-10 – 2011-06-13 (×7): 25 mg via ORAL
  Filled 2011-06-10 (×9): qty 1

## 2011-06-10 NOTE — Progress Notes (Signed)
Speech Language Pathology Dysphagia Treatment Patient Details Name: [name] [name] MRN: [id number] DOB: 08/07/1926 Today's Date: 06/10/2011 Time: 7673-4193 SLP Time Calculation (min): 13 min  Assessment / Plan / Recommendation Clinical Impression  Pt continues to demonstrate sluggish swallow response with concern for delay in swallow initiation. Recommend continue thickened liquids until MS improves. Will f/u on Monday at bedside and complete objective test as needed if pt able to participate from a GI standpoint. NOTE: pt on a clear liquid HONEY thick diet. Items on tray do not arrive thickened. Sign placed and discussed need to thicken liquids on tray.     Diet Recommendation  Continue with Current Diet: Other (comment);Honey-thick liquid (clear liquids)    SLP Plan Continue with current plan of care   Pertinent Vitals/Pain NA   Swallowing Goals  SLP Swallowing Goals Patient will consume recommended diet without observed clinical signs of aspiration with: Minimal assistance Swallow Study Goal #1 - Progress: Progressing toward goal Patient will utilize recommended strategies during swallow to increase swallowing safety with: Moderate cueing Swallow Study Goal #2 - Progress: Progressing toward goal  General Temperature Spikes Noted: Yes Respiratory Status: Supplemental O2 delivered via (comment) Behavior/Cognition: Alert;Cooperative;Confused Oral Cavity - Dentition: Dentures, top;Dentures, not available Patient Positioning: Upright in bed  Oral Cavity - Oral Hygiene     Dysphagia Treatment Treatment focused on: Skilled observation of diet tolerance;Upgraded PO texture trials Treatment Methods/Modalities: Skilled observation;Differential diagnosis Patient observed directly with PO's: Yes Type of PO's observed: Nectar-thick liquids;Honey-thick liquids Feeding: Able to feed self Liquids provided via: Cup Pharyngeal Phase Signs & Symptoms: Immediate throat clear;Suspected delayed  swallow initiation Type of cueing: Verbal;Tactile Amount of cueing: Minimal   [name] [name], [name] 06/10/2011, 10:52 AM

## 2011-06-10 NOTE — Progress Notes (Signed)
[name] [name] 10:24 AM  Subjective: Patient is doing well he wants to eat and has no signs of further bleeding and no black stools according to the nurse  Objective: Vital signs stable afebrile more alert today no acute distress abdomen is soft nontender hemoglobin stable BUN stable  Assessment: Multiple medical problem in a patient with self-limited hematemesis with over anticoagulation  Plan: Okay to advance will ask my partner to check on tomorrow but might need an endoscopy prior to discharge just to be sure or sooner if signs of ongoing bleeding  Denise Bramblett E

## 2011-06-10 NOTE — Progress Notes (Signed)
Clinical Social Worker met with pt and family at bedside. CSW introduced sekf and explained role.  CSW reviewed dc plan.  Family open to SNF placement, interested in [facility] as pt previously received rehab at this facility.  CSW contacted facility to relay information, facility indicated they would have a bed available for pt.  CSW submitted appropriate paperwork to SNF.    Angelia Mould, MSW, [place] [phone]

## 2011-06-10 NOTE — Progress Notes (Signed)
Admin diluted Definity contrast per @D  ECHO order.
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
## 2011-06-10 NOTE — Progress Notes (Signed)
*  PRELIMINARY RESULTS* Echocardiogram 2D Echocardiogram with Definity has been performed.  Glean Salen [facility] 06/10/2011, 11:03 AM
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
## 2011-06-11 ENCOUNTER — Inpatient Hospital Stay (HOSPITAL_COMMUNITY): Payer: Medicare Other

## 2011-06-11 DIAGNOSIS — R197 Diarrhea, unspecified: Secondary | ICD-10-CM

## 2011-06-11 DIAGNOSIS — E782 Mixed hyperlipidemia: Secondary | ICD-10-CM

## 2011-06-11 DIAGNOSIS — K920 Hematemesis: Secondary | ICD-10-CM

## 2011-06-11 DIAGNOSIS — J9600 Acute respiratory failure, unspecified whether with hypoxia or hypercapnia: Secondary | ICD-10-CM

## 2011-06-11 LAB — DIFFERENTIAL
Basophils Relative: 0 % (ref 0–1)
Eosinophils Absolute: 0.4 10*3/uL (ref 0.0–0.7)
Monocytes Absolute: 1.2 10*3/uL — ABNORMAL HIGH (ref 0.1–1.0)
Monocytes Relative: 10 % (ref 3–12)

## 2011-06-11 LAB — CBC
HCT: 28.8 % — ABNORMAL LOW (ref 36.0–46.0)
Hemoglobin: 8.9 g/dL — ABNORMAL LOW (ref 12.0–15.0)
MCH: 23.9 pg — ABNORMAL LOW (ref 26.0–34.0)
MCHC: 30.9 g/dL (ref 30.0–36.0)

## 2011-06-11 MED ORDER — GUAIFENESIN ER 600 MG PO TB12
600.0000 mg | ORAL_TABLET | Freq: Two times a day (BID) | ORAL | Status: DC
  Administered 2011-06-11 – 2011-06-13 (×5): 600 mg via ORAL
  Filled 2011-06-11 (×7): qty 1

## 2011-06-11 MED ORDER — PANTOPRAZOLE SODIUM 40 MG PO TBEC
40.0000 mg | DELAYED_RELEASE_TABLET | Freq: Two times a day (BID) | ORAL | Status: DC
  Administered 2011-06-12 – 2011-06-13 (×3): 40 mg via ORAL
  Filled 2011-06-11 (×3): qty 1

## 2011-06-11 MED ORDER — SODIUM CHLORIDE 0.9 % IV SOLN
1.5000 g | Freq: Four times a day (QID) | INTRAVENOUS | Status: DC
  Administered 2011-06-11 – 2011-06-12 (×4): 1.5 g via INTRAVENOUS
  Filled 2011-06-11 (×7): qty 1.5

## 2011-06-11 MED ORDER — POTASSIUM CHLORIDE CRYS ER 20 MEQ PO TBCR
40.0000 meq | EXTENDED_RELEASE_TABLET | Freq: Two times a day (BID) | ORAL | Status: DC
  Administered 2011-06-11 – 2011-06-12 (×3): 40 meq via ORAL
  Filled 2011-06-11 (×4): qty 2

## 2011-06-11 NOTE — Progress Notes (Signed)
Discussed this patient with NP and agree with plan

## 2011-06-11 NOTE — Progress Notes (Signed)
[facility] Progress Note  Subjective: Patient without complaints, tolerating diet as ordered no nausea or pain  Objective: Vital signs in last 24 hours: Temp:  [98.2 F (36.8 C)-98.7 F (37.1 C)] 98.2 F (36.8 C) (05/11 0800) Pulse Rate:  [55-79] 57  (05/11 0800) Resp:  [19-28] 19  (05/11 0800) BP: (100-117)/(55-74) 117/63 mmHg (05/11 0800) SpO2:  [97 %-98 %] 98 % (05/11 0800) Weight change:    PE: Unchanged  Lab Results: Results for orders placed during the hospital encounter of 06/07/11 (from the past 24 hour(s))  PROTIME-INR     Status: Abnormal   Collection Time   06/11/11  4:30 AM      Component Value Range   Prothrombin Time 15.5 (*) 11.6 - 15.2 (seconds)   INR 1.20  0.00 - 1.49   CBC     Status: Abnormal   Collection Time   06/11/11  4:30 AM      Component Value Range   WBC 12.5 (*) 4.0 - 10.5 (K/uL)   RBC 3.72 (*) 3.87 - 5.11 (MIL/uL)   Hemoglobin 8.9 (*) 12.0 - 15.0 (g/dL)   HCT 60.4 (*) 54.0 - 46.0 (%)   MCV 77.4 (*) 78.0 - 100.0 (fL)   MCH 23.9 (*) 26.0 - 34.0 (pg)   MCHC 30.9  30.0 - 36.0 (g/dL)   RDW 98.1  19.1 - 47.8 (%)   Platelets 327  150 - 400 (K/uL)  DIFFERENTIAL     Status: Abnormal   Collection Time   06/11/11  4:30 AM      Component Value Range   Neutrophils Relative 75  43 - 77 (%)   Neutro Abs 9.4 (*) 1.7 - 7.7 (K/uL)   Lymphocytes Relative 12  12 - 46 (%)   Lymphs Abs 1.5  0.7 - 4.0 (K/uL)   Monocytes Relative 10  3 - 12 (%)   Monocytes Absolute 1.2 (*) 0.1 - 1.0 (K/uL)   Eosinophils Relative 3  0 - 5 (%)   Eosinophils Absolute 0.4  0.0 - 0.7 (K/uL)   Basophils Relative 0  0 - 1 (%)   Basophils Absolute 0.0  0.0 - 0.1 (K/uL)    Studies/Results: No results found.    Assessment: Reported coffee-ground emesis not sure whether representing true GI bleeding in a patient with a excessive anticoagulation on Coumadin.  Plan: Patient appears stable, was already on PPI which I would continue. Will sign off for  now.    Enedelia Martorelli C 06/11/2011, 10:48 AM
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
## 2011-06-11 NOTE — Progress Notes (Signed)
ANTIBIOTIC CONSULT NOTE - INITIAL  Pharmacy Consult for Unasyn Indication: UTI / potential pneumonitis  No Known Allergies  Patient Measurements: Height: 5\' 6"  (167.6 cm) Weight: 166 lb 10.7 oz (75.6 kg) IBW/kg (Calculated) : 59.3  Adjusted Body Weight:   Vital Signs: Temp: 98.2 F (36.8 C) (05/11 0800) Temp src: Oral (05/11 1600) BP: 110/66 mmHg (05/11 1600) Pulse Rate: 67  (05/11 1600) Intake/Output from previous day: 05/10 0701 - 05/11 0700 In: 60 [I.V.:60] Out: 50 [Urine:50] Intake/Output from this shift: Total I/O In: 260 [P.O.:240; I.V.:20] Out: 150 [Urine:150]  Labs:  Basename 06/11/11 0430 06/10/11 0405 06/09/11 0540  WBC 12.5* 16.0* 17.0*  HGB 8.9* 9.1* 8.9*  PLT 327 306 304  LABCREA -- -- --  CREATININE -- 0.70 0.64   Estimated Creatinine Clearance: 56.3 ml/min (by C-G formula based on Cr of 0.7). No results found for this basename: VANCOTROUGH:2,VANCOPEAK:2,VANCORANDOM:2,GENTTROUGH:2,GENTPEAK:2,GENTRANDOM:2,TOBRATROUGH:2,TOBRAPEAK:2,TOBRARND:2,AMIKACINPEAK:2,AMIKACINTROU:2,AMIKACIN:2, in the last 72 hours   Microbiology: Recent Results (from the past 720 hour(s))  URINE CULTURE     Status: Normal   Collection Time   06/07/11  3:07 PM      Component Value Range Status Comment   Specimen Description URINE, CATHETERIZED   Final    Special Requests NONE   Final    Culture  Setup Time [id number]   Final    Colony Count >=100,000 COLONIES/ML   Final    Culture KLEBSIELLA PNEUMONIAE   Final    Report Status 06/09/2011 FINAL   Final    Organism ID, Bacteria KLEBSIELLA PNEUMONIAE   Final   CULTURE, BLOOD (ROUTINE X 2)     Status: Normal (Preliminary result)   Collection Time   06/07/11  8:10 PM      Component Value Range Status Comment   Specimen Description BLOOD LEFT ARM   Final    Special Requests BOTTLES DRAWN AEROBIC ONLY 10CC   Final    Culture  Setup Time [id number]   Final    Culture     Final    Value:        BLOOD CULTURE RECEIVED NO GROWTH TO  DATE CULTURE WILL BE HELD FOR 5 DAYS BEFORE ISSUING A FINAL NEGATIVE REPORT   Report Status PENDING   Incomplete   CULTURE, BLOOD (ROUTINE X 2)     Status: Normal (Preliminary result)   Collection Time   06/07/11  8:20 PM      Component Value Range Status Comment   Specimen Description BLOOD LEFT ARM   Final    Special Requests BOTTLES DRAWN AEROBIC ONLY 10CC LEFT LATERAL ARM   Final    Culture  Setup Time [id number]   Final    Culture     Final    Value:        BLOOD CULTURE RECEIVED NO GROWTH TO DATE CULTURE WILL BE HELD FOR 5 DAYS BEFORE ISSUING A FINAL NEGATIVE REPORT   Report Status PENDING   Incomplete   MRSA PCR SCREENING     Status: Normal   Collection Time   06/09/11  9:14 AM      Component Value Range Status Comment   MRSA by PCR NEGATIVE  NEGATIVE  Final     Medical History: Past Medical History  Diagnosis Date  . Atrial fib/flutter, transient   . COPD (chronic obstructive pulmonary disease)   . GERD (gastroesophageal reflux disease)   . Pain     Medications:  Scheduled:    . ampicillin-sulbactam (UNASYN) IV  1.5 g Intravenous  Q6H  . diltiazem  180 mg Oral Daily  . docusate sodium  100 mg Oral BID  . donepezil  10 mg Oral QHS  . fluticasone  2 spray Each Nare Daily  . furosemide  40 mg Intravenous Q12H  . guaiFENesin  600 mg Oral BID  . metoprolol tartrate  25 mg Oral BID  . pantoprazole  40 mg Oral BID AC  . PARoxetine  30 mg Oral q morning - 10a  . potassium chloride  40 mEq Oral BID  . DISCONTD: budesonide-formoterol  2 puff Inhalation BID  . DISCONTD: levofloxacin (LEVAQUIN) IV  500 mg Intravenous Q24H  . DISCONTD: pantoprazole (PROTONIX) IV  40 mg Intravenous Q12H   Infusions:    . DISCONTD: sodium chloride 10 mL/hr at 06/11/11 0900   Assessment: 78 yo female with UTI / possible pneumonitis will be put on unasyn per pharmacy. CrCl 56.3  Goal of Therapy:    Plan:  1) Start Unasyn 1.5g iv q6h 2) Pharmacy will sign off.  Deicy Rusk,  Tsz-Yin 06/11/2011,5:38 PM

## 2011-06-12 DIAGNOSIS — J9600 Acute respiratory failure, unspecified whether with hypoxia or hypercapnia: Secondary | ICD-10-CM

## 2011-06-12 DIAGNOSIS — K920 Hematemesis: Secondary | ICD-10-CM

## 2011-06-12 DIAGNOSIS — R197 Diarrhea, unspecified: Secondary | ICD-10-CM

## 2011-06-12 DIAGNOSIS — E782 Mixed hyperlipidemia: Secondary | ICD-10-CM

## 2011-06-12 LAB — BASIC METABOLIC PANEL
BUN: 34 mg/dL — ABNORMAL HIGH (ref 6–23)
Chloride: 101 mEq/L (ref 96–112)
Creatinine, Ser: 0.98 mg/dL (ref 0.50–1.10)
GFR calc Af Amer: 61 mL/min — ABNORMAL LOW (ref >90)
GFR calc non Af Amer: 52 mL/min — ABNORMAL LOW (ref >90)

## 2011-06-12 LAB — CBC
HCT: 30.2 % — ABNORMAL LOW (ref 36.0–46.0)
MCH: 24.2 pg — ABNORMAL LOW (ref 26.0–34.0)
MCHC: 31.1 g/dL (ref 30.0–36.0)
RDW: 15.2 % (ref 11.5–15.5)
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
## 2011-06-12 NOTE — Progress Notes (Signed)
Patient received from [id number]. Alert and Oriented x 3 at this time. No acute distress. Madelin Rear RN, CMSRN

## 2011-06-12 NOTE — Progress Notes (Signed)
Pt transferred to [id number], per MD order. Report called to receiving nurse and all questions answered. Family notified of transfer.

## 2011-06-13 DIAGNOSIS — R197 Diarrhea, unspecified: Secondary | ICD-10-CM

## 2011-06-13 DIAGNOSIS — J9600 Acute respiratory failure, unspecified whether with hypoxia or hypercapnia: Secondary | ICD-10-CM

## 2011-06-13 DIAGNOSIS — E782 Mixed hyperlipidemia: Secondary | ICD-10-CM

## 2011-06-13 DIAGNOSIS — K920 Hematemesis: Secondary | ICD-10-CM

## 2011-06-13 MED ORDER — DSS 100 MG PO CAPS: 100.0000 mg | ORAL_CAPSULE | Freq: Two times a day (BID) | ORAL | Status: AC

## 2011-06-13 MED ORDER — METOPROLOL TARTRATE 25 MG PO TABS: 25.0000 mg | ORAL_TABLET | Freq: Two times a day (BID) | ORAL | Status: DC

## 2011-06-13 MED ORDER — LEVALBUTEROL HCL 0.63 MG/3ML IN NEBU: 0.6300 mg | INHALATION_SOLUTION | RESPIRATORY_TRACT | Status: DC | PRN

## 2011-06-13 MED ORDER — IPRATROPIUM BROMIDE 0.02 % IN SOLN: 0.5000 mg | Freq: Three times a day (TID) | RESPIRATORY_TRACT | Status: DC

## 2011-06-13 MED ORDER — PANTOPRAZOLE SODIUM 40 MG PO TBEC: 40.0000 mg | DELAYED_RELEASE_TABLET | Freq: Every day | ORAL | Status: DC

## 2011-06-13 MED ORDER — GUAIFENESIN ER 600 MG PO TB12: 600.0000 mg | ORAL_TABLET | Freq: Two times a day (BID) | ORAL | Status: AC

## 2011-06-13 MED ORDER — DILTIAZEM HCL ER 180 MG PO CP24: 180.0000 mg | ORAL_CAPSULE | Freq: Every day | ORAL | Status: DC

## 2011-06-13 MED ORDER — ASPIRIN EC 81 MG PO TBEC: 81.0000 mg | DELAYED_RELEASE_TABLET | Freq: Every day | ORAL | Status: AC

## 2011-06-13 NOTE — Progress Notes (Signed)
Speech Language Pathology Dysphagia Treatment Patient Details Name: [name] [name] MRN: [id number] DOB: 1926-05-10 Today's Date: 06/13/2011 Time: 7829-5621 SLP Time Calculation (min): 20 min  Assessment / Plan / Recommendation Clinical Impression  Pt. seen for dysphagia treatment.   Over the weekend, pt's diet was upgraded to regular texture and honey thick liquids with plan being objective swallow assessment if pt. appropriate from a GI standpoint.  Plan is for pt. to be discharged today.  SLP observed with thin water for possible upgrade, however, wet vocal quality and immediate cough.  Juice thickened to nectar consistency without s/s aspiration exhibited.  Prolonged mastication with graham cracker likely due to no lower dentition.  Recommend downgrade of textures to Dys 3 and upgrade of liquids to nectar thick.  If pt. exhibits s/s aspiration with nectar thick, recommend pt. have an MBS an an outpatient.  Have requested pt.'s RN page this SLP when son arrives to review plan as pt. to be discharged home with son.      Diet Recommendation  Initiate / Change Diet: Nectar-thick liquid;Dysphagia 2 (fine chop)    SLP Plan Continue with current plan of care;Other (Comment) (UNTIL PT. IS DISCHARGED)   Pertinent Vitals/Pain       General Temperature Spikes Noted: No Respiratory Status: Supplemental O2 delivered via (comment) Behavior/Cognition: Alert;Cooperative;Pleasant mood Oral Cavity - Dentition: Dentures, top (NO LOWER DENTITION) Patient Positioning: Upright in chair  Oral Cavity - Oral Hygiene Does patient have any of the following "at risk" factors?: Oxygen therapy - cannula, mask, simple oxygen devices Brush patient's teeth BID with toothbrush (using toothpaste with fluoride): Yes Patient is AT RISK - Oral Care Protocol followed (see row info): Yes   Dysphagia Treatment Treatment focused on: Skilled observation of diet tolerance Treatment Methods/Modalities: Skilled  observation Patient observed directly with PO's: Yes Type of PO's observed: Dysphagia 3 (soft);Thin liquids;Nectar-thick liquids Feeding: Able to feed self Liquids provided via: Cup Oral Phase Signs & Symptoms: Prolonged mastication Pharyngeal Phase Signs & Symptoms: Immediate cough (WET VOCAL QUALITY) Type of cueing: Verbal Amount of cueing: Minimal   Royce Macadamia M.Ed [employer] [phone]  06/13/2011

## 2011-06-13 NOTE — Clinical Social Work Placement (Signed)
     Clinical Social Work Department CLINICAL SOCIAL WORK PLACEMENT NOTE 06/13/2011  Patient:  SHANIKKA, WONDERS  Account Number:  [id number] Admit date:  06/07/2011  Clinical Social Worker:  Jacelyn Grip  Date/time:  06/13/2011 05:06 PM  Clinical Social Work is seeking post-discharge placement for this patient at the following level of care:   SKILLED NURSING   (*CSW will update this form in Epic as items are completed)   06/13/2011  Patient/family provided with [facility] Department of Clinical Social Works list of facilities offering this level of care within the geographic area requested by the patient (or if unable, by the patients family).  06/13/2011  Patient/family informed of their freedom to choose among providers that offer the needed level of care, that participate in Medicare, Medicaid or managed care program needed by the patient, have an available bed and are willing to accept the patient.  06/13/2011  Patient/family informed of [facility] ownership interest in [facility], as well as of the fact that they are under no obligation to receive care at this facility.  PASARR submitted to EDS on 06/13/2011 PASARR number received from EDS on 06/13/2011  FL2 transmitted to all facilities in geographic area requested by pt/family on  06/13/2011 FL2 transmitted to all facilities within larger geographic area on   Patient informed that his/her managed care company has contracts with or will negotiate with  certain facilities, including the following:     Patient/family informed of bed offers received:  06/13/2011 Patient chooses bed at [facility] Physician recommends and patient chooses bed at    Patient to be transferred to [facility] on  06/13/2011 Patient to be transferred to facility by [facility]  The following physician request were entered in Epic:   Additional Comments: Per discussion with CSW colleague,  pt was provided with snf list, was faxed out to [employer], and chose [facility], and pt had Advertising copywriter.
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
## 2011-06-14 LAB — CULTURE, BLOOD (ROUTINE X 2)
Culture  Setup Time: 201305080123
Culture: NO GROWTH

## 2011-06-15 MED FILL — Perflutren Lipid Microsphere IV Susp 6.52 MG/ML: INTRAVENOUS | Qty: 2 | Status: AC

## 2011-07-08 ENCOUNTER — Ambulatory Visit (HOSPITAL_COMMUNITY)
Admission: RE | Admit: 2011-07-08 | Discharge: 2011-07-08 | Disposition: A | Discharge status: Home / Self Care | Payer: Medicare Other | Source: Ambulatory Visit | Attending: Internal Medicine | Admitting: Internal Medicine

## 2011-07-08 DIAGNOSIS — Z9089 Acquired absence of other organs: Secondary | ICD-10-CM | POA: Insufficient documentation

## 2011-07-08 DIAGNOSIS — J4489 Other specified chronic obstructive pulmonary disease: Secondary | ICD-10-CM | POA: Insufficient documentation

## 2011-07-08 DIAGNOSIS — R1311 Dysphagia, oral phase: Secondary | ICD-10-CM | POA: Insufficient documentation

## 2011-07-08 DIAGNOSIS — K219 Gastro-esophageal reflux disease without esophagitis: Secondary | ICD-10-CM | POA: Insufficient documentation

## 2011-07-08 DIAGNOSIS — R1310 Dysphagia, unspecified: Secondary | ICD-10-CM | POA: Insufficient documentation

## 2011-07-08 DIAGNOSIS — I4892 Unspecified atrial flutter: Secondary | ICD-10-CM | POA: Insufficient documentation

## 2011-07-08 DIAGNOSIS — R1313 Dysphagia, pharyngeal phase: Secondary | ICD-10-CM | POA: Insufficient documentation

## 2011-07-08 DIAGNOSIS — J449 Chronic obstructive pulmonary disease, unspecified: Secondary | ICD-10-CM | POA: Insufficient documentation

## 2011-07-08 DIAGNOSIS — Z9889 Other specified postprocedural states: Secondary | ICD-10-CM | POA: Insufficient documentation

## 2011-07-08 NOTE — Procedures (Signed)
Objective Swallowing Evaluation: Modified Barium Swallowing Study  Patient Details  Name: [name] [name] MRN: [id number] Date of Birth: [birth year]  Today's Date: 07/08/2011 Time: 1140-1200 SLP Time Calculation (min): 20 min  Past Medical History:  Past Medical History  Diagnosis Date  . Atrial fib/flutter, transient   . COPD (chronic obstructive pulmonary disease)   . GERD (gastroesophageal reflux disease)   . Pain    Past Surgical History:  Past Surgical History  Procedure Date  . Nasal sinus surgery   . Cholecystectomy    HPI:  Patient recently discharged from [facility] to SNF on 5/13. Course characterized as follows, pt was brought by family because of confusion, fever and episode of vomiting. She was found to have a Klebsiella UTI. She also had an episode of hematemesis and developed respiratory distress (suspected fluid overload and aspiration). She remained confused and lethargic for a few days. Pt has BSE during stay with overt s/s of aspiration with thickened liquids, started with honey and upgrade to nectar/D3 prior to d/c. Pt arriving for a possible diet upgrade.      Assessment / Plan / Recommendation Clinical Impression  Dysphagia Diagnosis: Mild oral phase dysphagia;Mild pharyngeal phase dysphagia Clinical impression: Pt presents with a mild oral dysphagia due to missing dentition and incomplete transit of masticated boluses with mild residue in lateral sulci. Pharyngeal dysphagia also mild with intermittent incomplete airway closure during the swallow with trace silent penetration, as well as trace silent penetration/aspiration with mild pyriform residual falling down posterior wall of vestibule post swallow. At this time the pt is safe to consume a mechanical soft diet with thin liquids with small single sips with a second swallow and throat clear. Pt is working with SLP at SNF to utilize these strategies. Though aspraiton risk at this time is mild, risk will increase with any  acute illness. Family aware.     Treatment Recommendation  Defer treatment plan to SLP at (Comment) (SNF)    Diet Recommendation Dysphagia 3 (Mechanical Soft);Thin liquid   Liquid Administration via: Cup Medication Administration: Whole meds with liquid Supervision: Full supervision/cueing for compensatory strategies;Patient able to self feed Compensations: Slow rate;Small sips/bites;Multiple dry swallows after each bite/sip;Clear throat intermittently Postural Changes and/or Swallow Maneuvers: Seated upright 90 degrees;Out of bed for meals    Other  Recommendations Oral Care Recommendations: Oral care BID   Follow Up Recommendations  Skilled Nursing facility    Frequency and Duration        Pertinent Vitals/Pain NA    SLP Swallow Goals     General HPI: Patient recently discharged from [facility] to SNF on 5/13. Course characterized as follows, pt was brought by family because of confusion, fever and episode of vomiting. She was found to have a Klebsiella UTI. She also had an episode of hematemesis and developed respiratory distress (suspected fluid overload and aspiration). She remained confused and lethargic for a few days. Pt has BSE during stay with overt s/s of aspiration with thickened liquids, started with honey and upgrade to nectar/D3 prior to d/c. Pt arriving for a possible diet upgrade.  Type of Study: Modified Barium Swallowing Study Reason for Referral: Objectively evaluate swallowing function Previous Swallow Assessment: BSE Diet Prior to this Study: Nectar-thick liquids;Dysphagia 3 (soft) Temperature Spikes Noted: No Respiratory Status: Supplemental O2 delivered via (comment) History of Recent Intubation: No Behavior/Cognition: Alert;Cooperative;Pleasant mood Oral Cavity - Dentition: Dentures, top Oral Motor / Sensory Function: Within functional limits Self-Feeding Abilities: Able to feed self Vision: Functional for  self-feeding Patient Positioning: Upright in  chair Baseline Vocal Quality: Clear Volitional Cough: Strong Volitional Swallow: Able to elicit Anatomy:  (Appearance of osteophytes throughout cervical spine) Pharyngeal Secretions: Not observed secondary MBS    Reason for Referral Objectively evaluate swallowing function   Oral Phase     Pharyngeal Phase Pharyngeal Phase: Impaired   Cervical Esophageal Phase Cervical Esophageal Phase: [facility]    [name] [name], [name] [name] 07/08/2011, 1:14 PM

## 2012-05-04 DIAGNOSIS — F0390 Unspecified dementia without behavioral disturbance: Secondary | ICD-10-CM

## 2012-05-04 DIAGNOSIS — R279 Unspecified lack of coordination: Secondary | ICD-10-CM

## 2012-05-04 DIAGNOSIS — I4891 Unspecified atrial fibrillation: Secondary | ICD-10-CM

## 2012-05-04 DIAGNOSIS — R269 Unspecified abnormalities of gait and mobility: Secondary | ICD-10-CM

## 2012-05-04 DIAGNOSIS — M6281 Muscle weakness (generalized): Secondary | ICD-10-CM

## 2012-05-04 DIAGNOSIS — D649 Anemia, unspecified: Secondary | ICD-10-CM

## 2012-08-31 DIAGNOSIS — R52 Pain, unspecified: Secondary | ICD-10-CM

## 2012-08-31 DIAGNOSIS — K219 Gastro-esophageal reflux disease without esophagitis: Secondary | ICD-10-CM

## 2012-08-31 DIAGNOSIS — J449 Chronic obstructive pulmonary disease, unspecified: Secondary | ICD-10-CM

## 2012-09-07 DIAGNOSIS — K219 Gastro-esophageal reflux disease without esophagitis: Secondary | ICD-10-CM | POA: Insufficient documentation

## 2012-09-07 DIAGNOSIS — J449 Chronic obstructive pulmonary disease, unspecified: Secondary | ICD-10-CM | POA: Insufficient documentation

## 2012-09-07 DIAGNOSIS — R52 Pain, unspecified: Secondary | ICD-10-CM | POA: Insufficient documentation

## 2012-09-17 ENCOUNTER — Other Ambulatory Visit: Payer: Self-pay | Admitting: Geriatric Medicine

## 2012-09-17 MED ORDER — TRAMADOL HCL 50 MG PO TABS: 50.0000 mg | ORAL_TABLET | Freq: Four times a day (QID) | ORAL | Status: DC | PRN

## 2013-02-13 ENCOUNTER — Encounter: Payer: Self-pay | Admitting: Internal Medicine

## 2013-02-13 ENCOUNTER — Non-Acute Institutional Stay (SKILLED_NURSING_FACILITY): Payer: Medicare Other | Admitting: Internal Medicine

## 2013-02-13 DIAGNOSIS — N189 Chronic kidney disease, unspecified: Secondary | ICD-10-CM

## 2013-02-13 DIAGNOSIS — I4891 Unspecified atrial fibrillation: Secondary | ICD-10-CM

## 2013-02-13 DIAGNOSIS — N039 Chronic nephritic syndrome with unspecified morphologic changes: Secondary | ICD-10-CM

## 2013-02-13 DIAGNOSIS — D631 Anemia in chronic kidney disease: Secondary | ICD-10-CM

## 2013-02-13 DIAGNOSIS — J189 Pneumonia, unspecified organism: Secondary | ICD-10-CM

## 2013-02-13 DIAGNOSIS — J449 Chronic obstructive pulmonary disease, unspecified: Secondary | ICD-10-CM

## 2013-02-13 DIAGNOSIS — I482 Chronic atrial fibrillation, unspecified: Secondary | ICD-10-CM

## 2013-02-13 DIAGNOSIS — N182 Chronic kidney disease, stage 2 (mild): Secondary | ICD-10-CM

## 2013-02-13 NOTE — Assessment & Plan Note (Signed)
02/07/2012 BUN/Cr was 15/1.1; Cr is increased over priors of 0.8 with a good BUN-probably progession of dx; will continue to monitor

## 2013-02-13 NOTE — Assessment & Plan Note (Signed)
Rate controlled on metoprolol and diltiazem; ASA 81 mg for prophylaxis;stable

## 2013-02-13 NOTE — Assessment & Plan Note (Addendum)
Pt is in an acute flare that started around 1/7; CXR showed PNA and pt on levaquin 750 mg  for 10 days;duonebs, solumedrol 125 mg X 1;initially on O2 to keep satts> 92; pt is activities today,and off O2; improved; continues routine nebs
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
## 2013-02-13 NOTE — Assessment & Plan Note (Signed)
Dx 1/7 by CXR;10 days of levaquin;pt is doing better

## 2013-02-13 NOTE — Assessment & Plan Note (Signed)
Last H/H 02/07/2012 was 14.0/43.4; excellent, no intervention at this time

## 2013-03-13 ENCOUNTER — Encounter: Payer: Self-pay | Admitting: Internal Medicine

## 2013-03-13 ENCOUNTER — Non-Acute Institutional Stay (SKILLED_NURSING_FACILITY): Payer: Medicare Other | Admitting: Internal Medicine

## 2013-03-13 DIAGNOSIS — J4489 Other specified chronic obstructive pulmonary disease: Secondary | ICD-10-CM

## 2013-03-13 DIAGNOSIS — J309 Allergic rhinitis, unspecified: Secondary | ICD-10-CM

## 2013-03-13 DIAGNOSIS — N039 Chronic nephritic syndrome with unspecified morphologic changes: Secondary | ICD-10-CM

## 2013-03-13 DIAGNOSIS — N189 Chronic kidney disease, unspecified: Secondary | ICD-10-CM

## 2013-03-13 DIAGNOSIS — J449 Chronic obstructive pulmonary disease, unspecified: Secondary | ICD-10-CM

## 2013-03-13 DIAGNOSIS — D631 Anemia in chronic kidney disease: Secondary | ICD-10-CM

## 2013-03-13 DIAGNOSIS — R1310 Dysphagia, unspecified: Secondary | ICD-10-CM

## 2013-03-13 NOTE — Assessment & Plan Note (Addendum)
Has had 2 flares in past month, one with PNA. Today pt is without resp distress, wheezing or rhonchi, and on no O2. Brovana nebs were started 2/5.

## 2013-03-13 NOTE — Assessment & Plan Note (Signed)
Chronic and stable on claritin and flonase

## 2013-03-13 NOTE — Assessment & Plan Note (Signed)
Chronic and constant threat of aspiration PNA there. Was treated for PNA in past month, not necessarily felt to be aspiration.
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
## 2013-03-13 NOTE — Assessment & Plan Note (Signed)
Prior assessment must be an error. 03/01/2012 Hb was 10.0 with MCV 83.3. Iron studies show low iron , low % sat. Pt started on iron 325 mg daily.

## 2013-05-22 ENCOUNTER — Emergency Department (HOSPITAL_COMMUNITY): Payer: PRIVATE HEALTH INSURANCE

## 2013-05-22 ENCOUNTER — Inpatient Hospital Stay (HOSPITAL_COMMUNITY)
Admission: EM | Admit: 2013-05-22 | Discharge: 2013-05-27 | DRG: 956 | Disposition: A | Discharge status: Skilled Nursing Facility | Payer: PRIVATE HEALTH INSURANCE | Attending: Internal Medicine | Admitting: Internal Medicine

## 2013-05-22 ENCOUNTER — Encounter (HOSPITAL_COMMUNITY): Payer: Self-pay | Admitting: Emergency Medicine

## 2013-05-22 DIAGNOSIS — L89309 Pressure ulcer of unspecified buttock, unspecified stage: Secondary | ICD-10-CM | POA: Diagnosis present

## 2013-05-22 DIAGNOSIS — E876 Hypokalemia: Secondary | ICD-10-CM

## 2013-05-22 DIAGNOSIS — F0390 Unspecified dementia without behavioral disturbance: Secondary | ICD-10-CM | POA: Diagnosis present

## 2013-05-22 DIAGNOSIS — I959 Hypotension, unspecified: Secondary | ICD-10-CM | POA: Diagnosis present

## 2013-05-22 DIAGNOSIS — Z8614 Personal history of Methicillin resistant Staphylococcus aureus infection: Secondary | ICD-10-CM

## 2013-05-22 DIAGNOSIS — S32509A Unspecified fracture of unspecified pubis, initial encounter for closed fracture: Secondary | ICD-10-CM | POA: Diagnosis present

## 2013-05-22 DIAGNOSIS — N182 Chronic kidney disease, stage 2 (mild): Secondary | ICD-10-CM

## 2013-05-22 DIAGNOSIS — Z79899 Other long term (current) drug therapy: Secondary | ICD-10-CM

## 2013-05-22 DIAGNOSIS — J9610 Chronic respiratory failure, unspecified whether with hypoxia or hypercapnia: Secondary | ICD-10-CM | POA: Diagnosis present

## 2013-05-22 DIAGNOSIS — R4182 Altered mental status, unspecified: Secondary | ICD-10-CM | POA: Diagnosis present

## 2013-05-22 DIAGNOSIS — F29 Unspecified psychosis not due to a substance or known physiological condition: Secondary | ICD-10-CM | POA: Diagnosis not present

## 2013-05-22 DIAGNOSIS — D72829 Elevated white blood cell count, unspecified: Secondary | ICD-10-CM

## 2013-05-22 DIAGNOSIS — N390 Urinary tract infection, site not specified: Secondary | ICD-10-CM | POA: Diagnosis present

## 2013-05-22 DIAGNOSIS — Z66 Do not resuscitate: Secondary | ICD-10-CM | POA: Diagnosis present

## 2013-05-22 DIAGNOSIS — I10 Essential (primary) hypertension: Secondary | ICD-10-CM | POA: Diagnosis present

## 2013-05-22 DIAGNOSIS — K922 Gastrointestinal hemorrhage, unspecified: Secondary | ICD-10-CM

## 2013-05-22 DIAGNOSIS — K920 Hematemesis: Secondary | ICD-10-CM

## 2013-05-22 DIAGNOSIS — J449 Chronic obstructive pulmonary disease, unspecified: Secondary | ICD-10-CM | POA: Diagnosis present

## 2013-05-22 DIAGNOSIS — J4489 Other specified chronic obstructive pulmonary disease: Secondary | ICD-10-CM | POA: Diagnosis present

## 2013-05-22 DIAGNOSIS — IMO0002 Reserved for concepts with insufficient information to code with codable children: Secondary | ICD-10-CM | POA: Diagnosis present

## 2013-05-22 DIAGNOSIS — T7601XA Adult neglect or abandonment, suspected, initial encounter: Secondary | ICD-10-CM

## 2013-05-22 DIAGNOSIS — J690 Pneumonitis due to inhalation of food and vomit: Secondary | ICD-10-CM

## 2013-05-22 DIAGNOSIS — S3140XA Unspecified open wound of vagina and vulva, initial encounter: Secondary | ICD-10-CM | POA: Diagnosis present

## 2013-05-22 DIAGNOSIS — R791 Abnormal coagulation profile: Secondary | ICD-10-CM

## 2013-05-22 DIAGNOSIS — R52 Pain, unspecified: Secondary | ICD-10-CM

## 2013-05-22 DIAGNOSIS — J189 Pneumonia, unspecified organism: Secondary | ICD-10-CM

## 2013-05-22 DIAGNOSIS — T40605A Adverse effect of unspecified narcotics, initial encounter: Secondary | ICD-10-CM | POA: Diagnosis present

## 2013-05-22 DIAGNOSIS — Y92009 Unspecified place in unspecified non-institutional (private) residence as the place of occurrence of the external cause: Secondary | ICD-10-CM

## 2013-05-22 DIAGNOSIS — L8992 Pressure ulcer of unspecified site, stage 2: Secondary | ICD-10-CM | POA: Diagnosis present

## 2013-05-22 DIAGNOSIS — S72001A Fracture of unspecified part of neck of right femur, initial encounter for closed fracture: Secondary | ICD-10-CM

## 2013-05-22 DIAGNOSIS — J9601 Acute respiratory failure with hypoxia: Secondary | ICD-10-CM

## 2013-05-22 DIAGNOSIS — Z7982 Long term (current) use of aspirin: Secondary | ICD-10-CM

## 2013-05-22 DIAGNOSIS — D649 Anemia, unspecified: Secondary | ICD-10-CM

## 2013-05-22 DIAGNOSIS — S72009A Fracture of unspecified part of neck of unspecified femur, initial encounter for closed fracture: Principal | ICD-10-CM | POA: Diagnosis present

## 2013-05-22 DIAGNOSIS — R1310 Dysphagia, unspecified: Secondary | ICD-10-CM

## 2013-05-22 DIAGNOSIS — I4891 Unspecified atrial fibrillation: Secondary | ICD-10-CM | POA: Diagnosis present

## 2013-05-22 DIAGNOSIS — K219 Gastro-esophageal reflux disease without esophagitis: Secondary | ICD-10-CM | POA: Diagnosis present

## 2013-05-22 DIAGNOSIS — R404 Transient alteration of awareness: Secondary | ICD-10-CM | POA: Diagnosis not present

## 2013-05-22 DIAGNOSIS — I482 Chronic atrial fibrillation, unspecified: Secondary | ICD-10-CM | POA: Diagnosis present

## 2013-05-22 DIAGNOSIS — G928 Other toxic encephalopathy: Secondary | ICD-10-CM

## 2013-05-22 DIAGNOSIS — Z9981 Dependence on supplemental oxygen: Secondary | ICD-10-CM

## 2013-05-22 DIAGNOSIS — G92 Toxic encephalopathy: Secondary | ICD-10-CM

## 2013-05-22 DIAGNOSIS — N189 Chronic kidney disease, unspecified: Secondary | ICD-10-CM

## 2013-05-22 DIAGNOSIS — D631 Anemia in chronic kidney disease: Secondary | ICD-10-CM

## 2013-05-22 DIAGNOSIS — S32599A Other specified fracture of unspecified pubis, initial encounter for closed fracture: Secondary | ICD-10-CM

## 2013-05-22 DIAGNOSIS — W19XXXA Unspecified fall, initial encounter: Secondary | ICD-10-CM | POA: Diagnosis present

## 2013-05-22 DIAGNOSIS — J309 Allergic rhinitis, unspecified: Secondary | ICD-10-CM

## 2013-05-22 DIAGNOSIS — Z993 Dependence on wheelchair: Secondary | ICD-10-CM

## 2013-05-22 DIAGNOSIS — Y9210 Unspecified residential institution as the place of occurrence of the external cause: Secondary | ICD-10-CM | POA: Diagnosis present

## 2013-05-22 DIAGNOSIS — J9600 Acute respiratory failure, unspecified whether with hypoxia or hypercapnia: Secondary | ICD-10-CM

## 2013-05-22 LAB — CBC WITH DIFFERENTIAL/PLATELET
Basophils Absolute: 0 10*3/uL (ref 0.0–0.1)
Basophils Relative: 0 % (ref 0–1)
Eosinophils Absolute: 0.5 10*3/uL (ref 0.0–0.7)
Eosinophils Relative: 5 % (ref 0–5)
HEMATOCRIT: 38.3 % (ref 36.0–46.0)
HEMOGLOBIN: 12.3 g/dL (ref 12.0–15.0)
LYMPHS PCT: 18 % (ref 12–46)
Lymphs Abs: 1.8 10*3/uL (ref 0.7–4.0)
MCH: 27.3 pg (ref 26.0–34.0)
MCHC: 32.1 g/dL (ref 30.0–36.0)
MCV: 84.9 fL (ref 78.0–100.0)
MONO ABS: 0.9 10*3/uL (ref 0.1–1.0)
MONOS PCT: 9 % (ref 3–12)
NEUTROS ABS: 6.9 10*3/uL (ref 1.7–7.7)
Neutrophils Relative %: 68 % (ref 43–77)
Platelets: 285 10*3/uL (ref 150–400)
RBC: 4.51 MIL/uL (ref 3.87–5.11)
RDW: 13.9 % (ref 11.5–15.5)
WBC: 10.1 10*3/uL (ref 4.0–10.5)

## 2013-05-22 LAB — COMPREHENSIVE METABOLIC PANEL
ALT: 12 U/L (ref 0–35)
AST: 15 U/L (ref 0–37)
Albumin: 2.5 g/dL — ABNORMAL LOW (ref 3.5–5.2)
Alkaline Phosphatase: 95 U/L (ref 39–117)
BILIRUBIN TOTAL: 0.3 mg/dL (ref 0.3–1.2)
BUN: 16 mg/dL (ref 6–23)
CHLORIDE: 100 meq/L (ref 96–112)
CO2: 26 meq/L (ref 19–32)
CREATININE: 0.72 mg/dL (ref 0.50–1.10)
Calcium: 8.4 mg/dL (ref 8.4–10.5)
GFR calc Af Amer: 89 mL/min — ABNORMAL LOW (ref >90)
GFR, EST NON AFRICAN AMERICAN: 77 mL/min — AB (ref >90)
GLUCOSE: 152 mg/dL — AB (ref 70–99)
Potassium: 3.6 mEq/L — ABNORMAL LOW (ref 3.7–5.3)
Sodium: 140 mEq/L (ref 137–147)
Total Protein: 6.7 g/dL (ref 6.0–8.3)

## 2013-05-22 LAB — URINALYSIS, ROUTINE W REFLEX MICROSCOPIC
Glucose, UA: NEGATIVE mg/dL
Ketones, ur: NEGATIVE mg/dL
Nitrite: NEGATIVE
PROTEIN: NEGATIVE mg/dL
Specific Gravity, Urine: 1.026 (ref 1.005–1.030)
UROBILINOGEN UA: 1 mg/dL (ref 0.0–1.0)
pH: 6.5 (ref 5.0–8.0)

## 2013-05-22 LAB — URINE MICROSCOPIC-ADD ON

## 2013-05-22 MED ORDER — PAROXETINE HCL 10 MG PO TABS
10.0000 mg | ORAL_TABLET | Freq: Every day | ORAL | Status: DC
  Administered 2013-05-25 – 2013-05-27 (×3): 10 mg via ORAL
  Filled 2013-05-22 (×5): qty 1

## 2013-05-22 MED ORDER — SODIUM CHLORIDE 0.9 % IV BOLUS (SEPSIS): 500.0000 mL | Freq: Once | INTRAVENOUS | Status: DC

## 2013-05-22 MED ORDER — FERROUS SULFATE 325 (65 FE) MG PO TABS
325.0000 mg | ORAL_TABLET | Freq: Every day | ORAL | Status: DC
  Administered 2013-05-25 – 2013-05-27 (×3): 325 mg via ORAL
  Filled 2013-05-22 (×8): qty 1

## 2013-05-22 MED ORDER — FLUTICASONE PROPIONATE 50 MCG/ACT NA SUSP
2.0000 | Freq: Every day | NASAL | Status: DC
  Administered 2013-05-23 – 2013-05-27 (×4): 2 via NASAL
  Filled 2013-05-22: qty 16

## 2013-05-22 MED ORDER — MORPHINE SULFATE 2 MG/ML IJ SOLN
0.5000 mg | INTRAMUSCULAR | Status: DC | PRN
  Administered 2013-05-24 – 2013-05-25 (×2): 0.5 mg via INTRAVENOUS
  Filled 2013-05-22 (×3): qty 1

## 2013-05-22 MED ORDER — ASPIRIN EC 81 MG PO TBEC
81.0000 mg | DELAYED_RELEASE_TABLET | Freq: Every day | ORAL | Status: DC
  Filled 2013-05-22 (×2): qty 1

## 2013-05-22 MED ORDER — ACETAMINOPHEN 325 MG PO TABS: 650.0000 mg | ORAL_TABLET | ORAL | Status: DC | PRN

## 2013-05-22 MED ORDER — HYDROCODONE-ACETAMINOPHEN 5-325 MG PO TABS
1.0000 | ORAL_TABLET | Freq: Four times a day (QID) | ORAL | Status: DC | PRN
  Filled 2013-05-22 (×2): qty 1

## 2013-05-22 MED ORDER — BISACODYL 10 MG RE SUPP: 10.0000 mg | RECTAL | Status: DC | PRN

## 2013-05-22 MED ORDER — IPRATROPIUM-ALBUTEROL 0.5-2.5 (3) MG/3ML IN SOLN: 3.0000 mL | RESPIRATORY_TRACT | Status: DC | PRN

## 2013-05-22 MED ORDER — HYDROCODONE-ACETAMINOPHEN 5-325 MG PO TABS
1.0000 | ORAL_TABLET | Freq: Four times a day (QID) | ORAL | Status: DC | PRN
  Administered 2013-05-26: 1 via ORAL

## 2013-05-22 MED ORDER — SODIUM CHLORIDE 0.9 % IV SOLN
INTRAVENOUS | Status: DC
  Administered 2013-05-23 – 2013-05-25 (×5): via INTRAVENOUS

## 2013-05-22 MED ORDER — ALBUTEROL SULFATE (2.5 MG/3ML) 0.083% IN NEBU: 2.5000 mg | INHALATION_SOLUTION | Freq: Four times a day (QID) | RESPIRATORY_TRACT | Status: DC | PRN

## 2013-05-22 MED ORDER — ARFORMOTEROL TARTRATE 15 MCG/2ML IN NEBU
15.0000 ug | INHALATION_SOLUTION | Freq: Two times a day (BID) | RESPIRATORY_TRACT | Status: DC
  Administered 2013-05-23 – 2013-05-27 (×7): 15 ug via RESPIRATORY_TRACT
  Filled 2013-05-22 (×12): qty 2

## 2013-05-22 MED ORDER — ENOXAPARIN SODIUM 40 MG/0.4ML ~~LOC~~ SOLN
40.0000 mg | SUBCUTANEOUS | Status: DC
  Administered 2013-05-23: 40 mg via SUBCUTANEOUS
  Filled 2013-05-22 (×2): qty 0.4

## 2013-05-22 MED ORDER — MAGNESIUM HYDROXIDE 400 MG/5ML PO SUSP: 30.0000 mL | Freq: Every day | ORAL | Status: DC | PRN

## 2013-05-22 MED ORDER — FAMOTIDINE 20 MG PO TABS
20.0000 mg | ORAL_TABLET | Freq: Every day | ORAL | Status: DC
  Administered 2013-05-25 – 2013-05-27 (×3): 20 mg via ORAL
  Filled 2013-05-22 (×5): qty 1

## 2013-05-22 MED ORDER — SODIUM CHLORIDE 0.9 % IV BOLUS (SEPSIS)
250.0000 mL | Freq: Once | INTRAVENOUS | Status: AC
  Administered 2013-05-22: 250 mL via INTRAVENOUS

## 2013-05-22 NOTE — ED Notes (Signed)
[facility] two days [facility]. NH did xray that shows right hip fracture. Patient was given Fentanyl 100mcg via EMS prior to arrival. [facility] [place] [facility] Patient is lethargic due to pain medication. VSS.

## 2013-05-22 NOTE — ED Notes (Signed)
Report called to Elizabeth, RN

## 2013-05-22 NOTE — H&P (Signed)
[facility] History and Physical  [name] [name] ZOX:[id number] DOB: [birth year] DOA: 05/22/2013  Referring physician: ED physician PCP: Kimber Relic, MD   Chief Complaint: fall a the SNF  HPI:  Pt is 80 yo female with HTN, COPD on 3L oxygen, atrial fibrillation, dementia, resident of [facility], brought to [facility] ED after an episode of fall at SNF and sustained right hip fracture. Please note that pt is unable to provide history due to dementia and no family is available at bedside.Pt apparently fell 2 days PTA and xray done shoed right hip fracture. She was given total of 100 mcg of fentanyl and was noted to be hypotensive on arrival to ED with SBP in 90's.   Assessment and Plan: Active Problems: Right hip fracture - after an episode of fall at home - will admit to telemetry floor - appreciate ortho input - will follow upon recommendations - provide analgesia as needed for symptom control - will need PT post op and once medically more stable  Hypotensive  - on arrival - will hold metoprolol, diltiazem COPD - continue oxygen, place on home regimen of BD's  Dementia - appears to be at baseline  Atrial fibrillation - monitor on tele - hold Diltiazem due to hypotension  - may need to restart once clinically indicated   Radiological Exams on Admission: Dg Chest 1 View  05/22/2013   CLINICAL DATA:  Chest pain  EXAM: CHEST - 1 VIEW  COMPARISON:  06/11/2011  FINDINGS: Cardiac shadow is stable. Patchy atelectatic changes are noted in the bases bilaterally. No focal confluent infiltrate is seen. Old left clavicular and rib fractures are noted. No acute bony abnormality is seen.  IMPRESSION: Bibasilar atelectatic changes as described.   Electronically Signed   By: Alcide Clever M.D.   On: 05/22/2013 20:47   Dg Hip Complete Right  05/22/2013   CLINICAL DATA:  Pain post fall  EXAM: RIGHT HIP - COMPLETE 2+ VIEW  COMPARISON:  06/07/2011 and earlier studies  FINDINGS: There is a  basicervical fracture of the right femoral neck, with approximately 2.4 cm of displacement, mild impaction of the fracture fragments. Additionally there are minimally displaced superior and inferior right pubic rami fractures, new since previous study of 06/07/2011. Left femoral head prosthesis is partially visualized. Spondylitic changes the lower lumbar spine. Pelvic vascular calcifications bilaterally.  IMPRESSION: 1. Displaced basicervical right femoral neck fracture. 2. Superior and inferior right pubic rami fractures.   Electronically Signed   By: Oley Balm M.D.   On: 05/22/2013 20:51    Code Status: DNR Family Communication: No family at bedside  Disposition Plan: Admit for further evaluation    Review of Systems:  Unable to obtain due to dementia     Past Medical History  Diagnosis Date  . Atrial fib/flutter, transient   . COPD (chronic obstructive pulmonary disease)   . GERD (gastroesophageal reflux disease)   . Pain     Past Surgical History  Procedure Laterality Date  . Nasal sinus surgery    . Cholecystectomy      Social History:  reports that she has never smoked. She does not have any smokeless tobacco history on file. She reports that she does not drink alcohol or use illicit drugs.  No Known Allergies  Unable to obtain family medical history due to dementia   Prior to Admission medications   Medication Sig Start Date End Date Taking? Authorizing Provider  acetaminophen (TYLENOL) 325 MG tablet Take 650 mg  by mouth every 4 (four) hours as needed for mild pain.   Yes Historical Provider, MD  albuterol (PROVENTIL) (2.5 MG/3ML) 0.083% nebulizer solution Take 2.5 mg by nebulization every 6 (six) hours as needed for wheezing or shortness of breath.   Yes Historical Provider, MD  arformoterol (BROVANA) 15 MCG/2ML NEBU Take 15 mcg by nebulization 2 (two) times daily.   Yes Historical Provider, MD  aspirin EC 81 MG tablet Take 81 mg by mouth daily.   Yes Historical  Provider, MD  bisacodyl (DULCOLAX) 10 MG suppository Place 10 mg rectally as needed for moderate constipation.   Yes Historical Provider, MD  cefTRIAXone (ROCEPHIN) 1 G injection Inject 1 g into the muscle daily. for 6 days 05/22/13  Yes Historical Provider, MD  diltiazem (DILACOR XR) 180 MG 24 hr capsule Take 1 capsule (180 mg total) by mouth daily. 06/13/11 05/22/13 Yes Shanker Levora DredgeM Ghimire, MD  ergocalciferol (VITAMIN D2) 50000 UNITS capsule Take 50,000 Units by mouth every 30 (thirty) days.   Yes Historical Provider, MD  ferrous sulfate 325 (65 FE) MG tablet Take 325 mg by mouth daily with breakfast.   Yes Historical Provider, MD  fluticasone (FLONASE) 50 MCG/ACT nasal spray Place 2 sprays into the nose daily.   Yes Historical Provider, MD  HYDROcodone-acetaminophen (NORCO/VICODIN) 5-325 MG per tablet Take 1 tablet by mouth every 6 (six) hours as needed for moderate pain.   Yes Historical Provider, MD  ipratropium-albuterol (DUONEB) 0.5-2.5 (3) MG/3ML SOLN Take 3 mLs by nebulization every 4 (four) hours as needed (wheezing).   Yes Historical Provider, MD  magnesium hydroxide (MILK OF MAGNESIA) 400 MG/5ML suspension Take 30 mLs by mouth daily as needed for mild constipation.   Yes Historical Provider, MD  memantine (NAMENDA) 10 MG tablet Take 10 mg by mouth 2 (two) times daily.   Yes Historical Provider, MD  metoprolol tartrate (LOPRESSOR) 25 MG tablet Take 25 mg by mouth 2 (two) times daily.   Yes Historical Provider, MD  PARoxetine (PAXIL) 10 MG tablet Take 10 mg by mouth daily.   Yes Historical Provider, MD  promethazine (PHENERGAN) 25 MG suppository Place 25 mg rectally every 6 (six) hours as needed for nausea or vomiting.   Yes Historical Provider, MD  psyllium (METAMUCIL) 58.6 % packet Take 1 packet by mouth daily.   Yes Historical Provider, MD  ranitidine (ZANTAC) 150 MG tablet Take 150 mg by mouth daily as needed. Acid reflux   Yes Historical Provider, MD    Physical Exam: Filed Vitals:    05/22/13 1855 05/22/13 1858 05/22/13 1930 05/22/13 2051  BP: 103/63 85/51 89/62  90/50  Pulse:  84 80 75  Temp: 98.2 F (36.8 C)     TempSrc: Oral     Resp:  14 17 15   SpO2: 96% 95% 97% 96%    Physical Exam  Constitutional: Appears well-developed and well-nourished. No distress.  HENT: Normocephalic. External right and left ear normal. Dry MM  Eyes: Conjunctivae and EOM are normal. PERRLA, no scleral icterus.  Neck: Normal ROM. Neck supple. No JVD. No tracheal deviation. No thyromegaly.  CVS: RRR, S1/S2 +, no murmurs, no gallops, no carotid bruit.  Pulmonary: Effort and breath sounds normal, no stridor, rhonchi, wheezes, rales.  Abdominal: Soft. BS +,  no distension, tenderness, rebound or guarding.  Musculoskeletal: Normal range of motion. Tender around right hip area  Lymphadenopathy: No lymphadenopathy noted, cervical, inguinal. Neuro: Alert and follows some commands, overall non focal  Skin: Skin is warm and dry.  No rash noted. Not diaphoretic. No erythema. No pallor.  Psychiatric: Unable to assess due to dementia   Labs on Admission:  Basic Metabolic Panel:  Recent Labs Lab 05/22/13 1857  NA 140  K 3.6*  CL 100  CO2 26  GLUCOSE 152*  BUN 16  CREATININE 0.72  CALCIUM 8.4   Liver Function Tests:  Recent Labs Lab 05/22/13 1857  AST 15  ALT 12  ALKPHOS 95  BILITOT 0.3  PROT 6.7  ALBUMIN 2.5*   CBC:  Recent Labs Lab 05/22/13 1857  WBC 10.1  NEUTROABS 6.9  HGB 12.3  HCT 38.3  MCV 84.9  PLT 285   EKG: Normal sinus rhythm, no ST/T wave changes  Dorothea OgleIskra M Theophilus Walz, MD  [facility] Pager [phone]  If 7PM-7AM, please contact night-coverage [email or web address] Password [email or web address] 05/22/2013, 9:18 PM

## 2013-05-22 NOTE — Consult Note (Signed)
Reason for Consult:  Right hip fracture Referring Physician: EDP  [name] [name] is an 80 y.o. female.  HPI:   80 yo female who was sent to the ED with an obvious right hip deformity.  Apparently she fell about 2 days ago.  She does have dementia, so her exam and communication is quite limited.   X-rays show an acute right hip femoral neck fracture.  Ortho and [facility] are consulted.  Past Medical History  Diagnosis Date  . Atrial fib/flutter, transient   . COPD (chronic obstructive pulmonary disease)   . GERD (gastroesophageal reflux disease)   . Pain     Past Surgical History  Procedure Laterality Date  . Nasal sinus surgery    . Cholecystectomy      History reviewed. No pertinent family history.  Social History:  reports that she has never smoked. She does not have any smokeless tobacco history on file. She reports that she does not drink alcohol or use illicit drugs.  Allergies: No Known Allergies  Medications: I have reviewed the patient's current medications.  Results for orders placed during the hospital encounter of 05/22/13 (from the past 48 hour(s))  CBC WITH DIFFERENTIAL     Status: None   Collection Time    05/22/13  6:57 PM      Result Value Ref Range   WBC 10.1  4.0 - 10.5 K/uL   RBC 4.51  3.87 - 5.11 MIL/uL   Hemoglobin 12.3  12.0 - 15.0 g/dL   HCT 38.3  36.0 - 46.0 %   MCV 84.9  78.0 - 100.0 fL   MCH 27.3  26.0 - 34.0 pg   MCHC 32.1  30.0 - 36.0 g/dL   RDW 13.9  11.5 - 15.5 %   Platelets 285  150 - 400 K/uL   Neutrophils Relative % 68  43 - 77 %   Neutro Abs 6.9  1.7 - 7.7 K/uL   Lymphocytes Relative 18  12 - 46 %   Lymphs Abs 1.8  0.7 - 4.0 K/uL   Monocytes Relative 9  3 - 12 %   Monocytes Absolute 0.9  0.1 - 1.0 K/uL   Eosinophils Relative 5  0 - 5 %   Eosinophils Absolute 0.5  0.0 - 0.7 K/uL   Basophils Relative 0  0 - 1 %   Basophils Absolute 0.0  0.0 - 0.1 K/uL  COMPREHENSIVE METABOLIC PANEL     Status: Abnormal   Collection Time     05/22/13  6:57 PM      Result Value Ref Range   Sodium 140  137 - 147 mEq/L   Potassium 3.6 (*) 3.7 - 5.3 mEq/L   Chloride 100  96 - 112 mEq/L   CO2 26  19 - 32 mEq/L   Glucose, Bld 152 (*) 70 - 99 mg/dL   BUN 16  6 - 23 mg/dL   Creatinine, Ser 0.72  0.50 - 1.10 mg/dL   Calcium 8.4  8.4 - 10.5 mg/dL   Total Protein 6.7  6.0 - 8.3 g/dL   Albumin 2.5 (*) 3.5 - 5.2 g/dL   AST 15  0 - 37 U/L   ALT 12  0 - 35 U/L   Alkaline Phosphatase 95  39 - 117 U/L   Total Bilirubin 0.3  0.3 - 1.2 mg/dL   GFR calc non Af Amer 77 (*) >90 mL/min   GFR calc Af Amer 89 (*) >90 mL/min   Comment: (  NOTE)     The eGFR has been calculated using the CKD EPI equation.     This calculation has not been validated in all clinical situations.     eGFR's persistently <90 mL/min signify possible Chronic Kidney     Disease.    Dg Chest 1 View  05/22/2013   CLINICAL DATA:  Chest pain  EXAM: CHEST - 1 VIEW  COMPARISON:  06/11/2011  FINDINGS: Cardiac shadow is stable. Patchy atelectatic changes are noted in the bases bilaterally. No focal confluent infiltrate is seen. Old left clavicular and rib fractures are noted. No acute bony abnormality is seen.  IMPRESSION: Bibasilar atelectatic changes as described.   Electronically Signed   By: Inez Catalina M.D.   On: 05/22/2013 20:47   Dg Hip Complete Right  05/22/2013   CLINICAL DATA:  Pain post fall  EXAM: RIGHT HIP - COMPLETE 2+ VIEW  COMPARISON:  06/07/2011 and earlier studies  FINDINGS: There is a basicervical fracture of the right femoral neck, with approximately 2.4 cm of displacement, mild impaction of the fracture fragments. Additionally there are minimally displaced superior and inferior right pubic rami fractures, new since previous study of 06/07/2011. Left femoral head prosthesis is partially visualized. Spondylitic changes the lower lumbar spine. Pelvic vascular calcifications bilaterally.  IMPRESSION: 1. Displaced basicervical right femoral neck fracture. 2. Superior  and inferior right pubic rami fractures.   Electronically Signed   By: Arne Cleveland M.D.   On: 05/22/2013 20:51    ROS Blood pressure 104/64, pulse 84, temperature 98.2 F (36.8 C), temperature source Oral, resp. rate 15, SpO2 97.00%. Physical Exam  Musculoskeletal:       Right hip: She exhibits decreased range of motion, bony tenderness and deformity.  Her right leg is shortened and externally rotated.  She expresses pain on manipulation of her right hip. Her right foot is warm and appears well perfused.  She does move her toes. Her skin is intact over her right hip.  Assessment/Plan: Right hip displaced femoral neck fracture in a demented nursing home patient 1)  Usual treatment of such an injury would involved a right partial hip replacement (hemiarthroplasty). She is graciously being admitted to the medical service for medical optimization if surgery is agreed upon by her family.  I did try to contact her son, but was unable to tonight, so informed consent for surgery has not been obtained.  For now, comfort care/pain control/etc until a discussion with her son is had in order to get a better understanding of the family's wishes.  Bed rest as well for now and she can eat tonight.  Hopefully surgery can be planned for tomorrow pending medical clearance as well as informed consent.  Mcarthur Rossetti 05/22/2013, 9:51 PM

## 2013-05-22 NOTE — ED Notes (Signed)
Patient transported to X-ray 

## 2013-05-22 NOTE — ED Provider Notes (Signed)
CSN: [id number]     Arrival date & time 05/22/13  1826 History   First MD Initiated Contact with Patient 05/22/13 1837     Chief Complaint  Patient presents with  . Fall     (Consider location/radiation/quality/duration/timing/severity/associated sxs/prior Treatment) HPI Comments: The patient is a 80 year old female with a past medical history of A. fib, COPD, presents [facility] the emergency department [facility] with a chief complaint of fall 2 days ago.  Per EMS and paperwork and x-ray performed today showed a right hip fracture. The patient was given a total of Fentanyl 100mcg prior to arrival.  Per paperwork the patient is a DO NOT RESUSCITATE, however the appropriate paperwork is not with her at this time. Pt is on 3L [place] at baseline. Little 5 caveat due to altered mental status.  Patient is a 80 y.o. female presenting with fall. The history is provided by the patient, medical records, the EMS personnel and the nursing home. No language interpreter was used.  Fall    Past Medical History  Diagnosis Date  . Atrial fib/flutter, transient   . COPD (chronic obstructive pulmonary disease)   . GERD (gastroesophageal reflux disease)   . Pain    Past Surgical History  Procedure Laterality Date  . Nasal sinus surgery    . Cholecystectomy     History reviewed. No pertinent family history. History  Substance Use Topics  . Smoking status: Never Smoker   . Smokeless tobacco: Not on file  . Alcohol Use: No   OB History   Grav Para Term Preterm Abortions TAB SAB Ect Mult Living                 Review of Systems  Unable to perform ROS: Mental status change      Allergies  Review of patient's allergies indicates no known allergies.  Home Medications   Prior to Admission medications   Medication Sig Start Date End Date Taking? Authorizing Provider  arformoterol (BROVANA) 15 MCG/2ML NEBU Take 15 mcg by nebulization 2 (two) times daily.   Yes Historical Provider, MD   aspirin 81 MG tablet Take 81 mg by mouth daily.    Historical Provider, MD  budesonide-formoterol (SYMBICORT) 80-4.5 MCG/ACT inhaler Inhale 2 puffs into the lungs 2 (two) times daily.    Historical Provider, MD  diltiazem (DILACOR XR) 180 MG 24 hr capsule Take 1 capsule (180 mg total) by mouth daily. 06/13/11 06/12/12  Shanker Levora DredgeM Ghimire, MD  donepezil (ARICEPT) 5 MG tablet Take 10 mg by mouth at bedtime.     Historical Provider, MD  fluticasone (FLONASE) 50 MCG/ACT nasal spray Place 2 sprays into the nose daily.    Historical Provider, MD  ipratropium (ATROVENT) 0.02 % nebulizer solution Take 2.5 mLs (0.5 mg total) by nebulization 3 (three) times daily. 06/13/11 06/12/12  Shanker Levora DredgeM Ghimire, MD  levalbuterol (XOPENEX) 0.63 MG/3ML nebulizer solution Take 3 mLs (0.63 mg total) by nebulization every 4 (four) hours as needed for wheezing or shortness of breath. 06/13/11 06/12/12  Shanker Levora DredgeM Ghimire, MD  metoprolol tartrate (LOPRESSOR) 25 MG tablet Take 1 tablet (25 mg total) by mouth 2 (two) times daily. 06/13/11 06/12/12  Shanker Levora DredgeM Ghimire, MD  OVER THE COUNTER MEDICATION Take 1 tablet by mouth at bedtime as needed. Sleep aid    Historical Provider, MD  pantoprazole (PROTONIX) 40 MG tablet Take 40 mg by mouth daily.    Historical Provider, MD  PARoxetine (PAXIL) 30 MG tablet Take 30  mg by mouth every morning.    Historical Provider, MD  ranitidine (ZANTAC) 150 MG tablet Take 150 mg by mouth daily as needed. Acid reflux    Historical Provider, MD  traMADol (ULTRAM) 50 MG tablet Take 1 tablet (50 mg total) by mouth every 6 (six) hours as needed. For pain 09/17/12   Tiffany L Reed, DO   BP 103/63  Pulse 90  Temp(Src) 98.7 F (37.1 C) (Oral)  Resp 14  SpO2 94% Physical Exam  Nursing note and vitals reviewed. Constitutional: She appears well-developed and well-nourished.  Non-toxic appearance. She does not have a sickly appearance. No distress.  HENT:  Head: Normocephalic and atraumatic.  Mouth/Throat: She  has dentures.  Food in the oropharynx.  Eyes: EOM are normal. Pupils are equal, round, and reactive to light. No scleral icterus.  Neck: Neck supple.  Cardiovascular: Normal rate.  An irregularly irregular rhythm present.  Pulses:      Dorsalis pedis pulses are 2+ on the right side, and 2+ on the left side.  Pulmonary/Chest: Effort normal. No accessory muscle usage. Not tachypneic. No respiratory distress. She has wheezes. She has no rales. She exhibits no tenderness.  Abdominal: Soft. She exhibits no distension. There is no tenderness. There is no rebound and no guarding.  Musculoskeletal:       Right hip: She exhibits decreased range of motion, bony tenderness and deformity.       Left hip: She exhibits no tenderness and no deformity.       Right knee: She exhibits no ecchymosis. No tenderness found.       Left knee: She exhibits no ecchymosis. No tenderness found.  Right lower extremity shortened and externally rotated.  Neurological: She is alert. GCS eye subscore is 3. GCS verbal subscore is 4. GCS motor subscore is 6.  Pt awakes to voice.  Speech is not clear.  Skin: Skin is warm and dry. No rash noted. She is not diaphoretic.    ED Course  Procedures (including critical care time) Labs Review Labs Reviewed  COMPREHENSIVE METABOLIC PANEL - Abnormal; Notable for the following:    Potassium 3.6 (*)    Glucose, Bld 152 (*)    Albumin 2.5 (*)    GFR calc non Af Amer 77 (*)    GFR calc Af Amer 89 (*)    All other components within normal limits  CBC WITH DIFFERENTIAL  URINALYSIS, ROUTINE W REFLEX MICROSCOPIC    Imaging Review Dg Chest 1 View  05/22/2013   CLINICAL DATA:  Chest pain  EXAM: CHEST - 1 VIEW  COMPARISON:  06/11/2011  FINDINGS: Cardiac shadow is stable. Patchy atelectatic changes are noted in the bases bilaterally. No focal confluent infiltrate is seen. Old left clavicular and rib fractures are noted. No acute bony abnormality is seen.  IMPRESSION: Bibasilar  atelectatic changes as described.   Electronically Signed   By: Alcide CleverMark  Lukens M.D.   On: 05/22/2013 20:47   Dg Hip Complete Right  05/22/2013   CLINICAL DATA:  Pain post fall  EXAM: RIGHT HIP - COMPLETE 2+ VIEW  COMPARISON:  06/07/2011 and earlier studies  FINDINGS: There is a basicervical fracture of the right femoral neck, with approximately 2.4 cm of displacement, mild impaction of the fracture fragments. Additionally there are minimally displaced superior and inferior right pubic rami fractures, new since previous study of 06/07/2011. Left femoral head prosthesis is partially visualized. Spondylitic changes the lower lumbar spine. Pelvic vascular calcifications bilaterally.  IMPRESSION: 1. Displaced  basicervical right femoral neck fracture. 2. Superior and inferior right pubic rami fractures.   Electronically Signed   By: Oley Balm M.D.   On: 05/22/2013 20:51     EKG Interpretation   Date/Time:  Wednesday May 22 2013 19:06:20 EDT Ventricular Rate:  83 PR Interval:    QRS Duration: 104 QT Interval:  422 QTC Calculation: 496 R Axis:   -48 Text Interpretation:  Age not entered, assumed to be  80 years old for  purpose of ECG interpretation Atrial fibrillation Left anterior fascicular  block Abnormal R-wave progression, late transition Borderline T  abnormalities, diffuse leads Borderline prolonged QT interval Confirmed by  HARRISON  MD, FORREST ([id number]) on 05/22/2013 7:23:01 PM      MDM   Final diagnoses:  Fracture of femoral neck, right, closed  Fracture of multiple pubic rami  Fall  COPD (chronic obstructive pulmonary disease)   Patient presents from nursing home with a shortened externally rotated right hip. Patient altered mental status likely secondary to fentanyl. Wheezing on exam with food still and oropharynx, speech incomprehensible secondary to food, and AMS. food was removed and suctioned.  Patient is normally on 3 L nasal cannula was placed on 4 L and oxygen  saturation is 96%.  PT BP 90's small fluid bolus ordered.  Dr. Romeo Apple also evaluated the patient in the emergency department. XR show Displaced R femoral neck fracture and superior and inferior R pubic fracture. 2110 Re-eval pt increase in alertness on exam. Consult to Orthopedics, Dr. Magnus Ivan to consult the patient.  Re-eval No wheezing on exam. Consult to internal medicine, Dr. Izola Price to admit the patient.    Meds given in ED:  Medications  sodium chloride 0.9 % bolus 500 mL (not administered)    New Prescriptions   No medications on file        Clabe Seal, PA-C 05/22/13 2224

## 2013-05-23 DIAGNOSIS — I959 Hypotension, unspecified: Secondary | ICD-10-CM | POA: Diagnosis present

## 2013-05-23 DIAGNOSIS — K922 Gastrointestinal hemorrhage, unspecified: Secondary | ICD-10-CM

## 2013-05-23 DIAGNOSIS — I482 Chronic atrial fibrillation, unspecified: Secondary | ICD-10-CM | POA: Diagnosis present

## 2013-05-23 DIAGNOSIS — F0390 Unspecified dementia without behavioral disturbance: Secondary | ICD-10-CM | POA: Diagnosis present

## 2013-05-23 DIAGNOSIS — W19XXXA Unspecified fall, initial encounter: Secondary | ICD-10-CM

## 2013-05-23 DIAGNOSIS — N390 Urinary tract infection, site not specified: Secondary | ICD-10-CM

## 2013-05-23 DIAGNOSIS — I4891 Unspecified atrial fibrillation: Secondary | ICD-10-CM

## 2013-05-23 DIAGNOSIS — R1310 Dysphagia, unspecified: Secondary | ICD-10-CM

## 2013-05-23 DIAGNOSIS — S72009A Fracture of unspecified part of neck of unspecified femur, initial encounter for closed fracture: Principal | ICD-10-CM

## 2013-05-23 LAB — CBC
HEMATOCRIT: 38 % (ref 36.0–46.0)
HEMOGLOBIN: 12.3 g/dL (ref 12.0–15.0)
MCH: 27.6 pg (ref 26.0–34.0)
MCHC: 32.4 g/dL (ref 30.0–36.0)
MCV: 85.4 fL (ref 78.0–100.0)
Platelets: 270 10*3/uL (ref 150–400)
RBC: 4.45 MIL/uL (ref 3.87–5.11)
RDW: 14 % (ref 11.5–15.5)
WBC: 8.4 10*3/uL (ref 4.0–10.5)

## 2013-05-23 LAB — BASIC METABOLIC PANEL
BUN: 15 mg/dL (ref 6–23)
CALCIUM: 8.3 mg/dL — AB (ref 8.4–10.5)
CHLORIDE: 103 meq/L (ref 96–112)
CO2: 25 meq/L (ref 19–32)
CREATININE: 0.58 mg/dL (ref 0.50–1.10)
GFR calc Af Amer: >90 mL/min (ref >90)
GFR calc non Af Amer: 82 mL/min — ABNORMAL LOW (ref >90)
GLUCOSE: 119 mg/dL — AB (ref 70–99)
Potassium: 3.5 mEq/L — ABNORMAL LOW (ref 3.7–5.3)
Sodium: 141 mEq/L (ref 137–147)

## 2013-05-23 LAB — MRSA PCR SCREENING: MRSA BY PCR: POSITIVE — AB

## 2013-05-23 MED ORDER — MUPIROCIN 2 % EX OINT
1.0000 "application " | TOPICAL_OINTMENT | Freq: Two times a day (BID) | CUTANEOUS | Status: DC
  Administered 2013-05-23 – 2013-05-27 (×9): 1 via NASAL
  Filled 2013-05-23: qty 22

## 2013-05-23 MED ORDER — POTASSIUM CHLORIDE CRYS ER 20 MEQ PO TBCR
40.0000 meq | EXTENDED_RELEASE_TABLET | Freq: Once | ORAL | Status: AC
  Administered 2013-05-23: 40 meq via ORAL
  Filled 2013-05-23: qty 2

## 2013-05-23 MED ORDER — BIOTENE DRY MOUTH MT LIQD
15.0000 mL | Freq: Two times a day (BID) | OROMUCOSAL | Status: DC
  Administered 2013-05-23 – 2013-05-27 (×7): 15 mL via OROMUCOSAL

## 2013-05-23 MED ORDER — CHLORHEXIDINE GLUCONATE CLOTH 2 % EX PADS
6.0000 | MEDICATED_PAD | Freq: Every day | CUTANEOUS | Status: AC
  Administered 2013-05-23 – 2013-05-27 (×5): 6 via TOPICAL

## 2013-05-23 MED ORDER — DEXTROSE 5 % IV SOLN
1.0000 g | INTRAVENOUS | Status: DC
  Administered 2013-05-23 – 2013-05-25 (×3): 1 g via INTRAVENOUS
  Filled 2013-05-23 (×3): qty 10

## 2013-05-23 NOTE — Care Management Note (Signed)
    Page 1 of 1   05/27/2013     4:37:05 PM CARE MANAGEMENT NOTE 05/27/2013  Patient:  [name] [name],[name] [name]   Account Number:  [id number]  Date Initiated:  05/23/2013  Documentation initiated by:  Letha CapeAYLOR,Luvenia Cranford  Subjective/Objective Assessment:   dx fx hip  [facility]     Action/Plan:   Anticipated DC Date:  05/27/2013   Anticipated DC Plan:  SKILLED NURSING FACILITY  In-house referral  Clinical Social Worker      DC Planning Services  CM consult      Choice offered to / List presented to:             Status of service:  Completed, signed off Medicare Important Message given?   (If response is "NO", the following Medicare IM given date fields will be blank) Date Medicare IM given:   Date Additional Medicare IM given:    Discharge Disposition:  SKILLED NURSING FACILITY  Per UR Regulation:  Reviewed for med. necessity/level of care/duration of stay  If discussed at Long Length of Stay Meetings, dates discussed:    Comments:  05/23/13 1217 Letha Capeeborah Jaselyn Nahm RN, BSN [phone] [name], NCM received referral for [facility] for this patient, NCM will check with MD to confirm.

## 2013-05-23 NOTE — Progress Notes (Addendum)
Dr. Bevely PalmerMade aware that patient converted to NSR from A fib Attempted to call son to get admission history completed, phone number on file not correct.

## 2013-05-23 NOTE — Progress Notes (Addendum)
Pt admitted to unit from ED. Pt alert only to self. Pt has 2 stage 2 pressure ulcers on bottom, skin tear on vagina & abrasions to R hip. VS stable. Safety video not viewed d/t pt's orientation. No family at bedside. Pt currently resting comfortably in bed w/ call bell within reach. Will continue to monitor.

## 2013-05-23 NOTE — Progress Notes (Signed)
[facility] PROGRESS NOTE   Lajoyce CornersRoberta J Dressel ZOX:[id number] DOB: 02/19/1926 DOA: 05/22/2013 PCP: Kimber RelicGREEN, ARTHUR G, MD  HPI/Subjective: Seen with RN at bedside, confused but awake and alert. Orthopedics was not able to contact family this morning. Surgeries postponed for tomorrow.  Assessment/Plan: Active Problems:   Hip fracture   Right hip fracture  - after an episode of fall at the SNF - will admit to telemetry floor  - appreciate ortho input  - Provide pain control and DVT prophylaxis. - will need PT post op and once medically more stable   UTI -Urinalysis consistent with UTI, I will start on Rocephin. -Adjust antibiotics according to culture results.  Hypotensive  - Could be secondary to UTI her medications including metoprolol and Cardizem. - will hold metoprolol, diltiazem   COPD - continue oxygen, place on home regimen of BD's   Dementia  - appears to be at baseline   Atrial fibrillation  - monitor on tele  - hold Diltiazem due to hypotension  - may need to restart once clinically indicated    Code Status: DO NOT RESUSCITATE Family Communication: Plan discussed with the patient. Disposition Plan: Remains inpatient   Consultants:  Dr. Magnus IvanBlackman  Procedures:  None  Antibiotics:  None   Objective: Filed Vitals:   05/23/13 0800  BP:   Pulse:   Temp:   Resp: 18    Intake/Output Summary (Last 24 hours) at 05/23/13 1222 Last data filed at 05/22/13 2133  Gross per 24 hour  Intake      0 ml  Output     90 ml  Net    -90 ml   Filed Weights   05/22/13 2255 05/23/13 0520  Weight: 74.1 kg (163 lb 5.8 oz) 74.1 kg (163 lb 5.8 oz)    Exam: General: Alert and awake, oriented x3, not in any acute distress. HEENT: anicteric sclera, pupils reactive to light and accommodation, EOMI CVS: S1-S2 clear, no murmur rubs or gallops Chest: clear to auscultation bilaterally, no wheezing, rales or rhonchi Abdomen: soft nontender, nondistended, normal  bowel sounds, no organomegaly Extremities: no cyanosis, clubbing or edema noted bilaterally Neuro: Cranial nerves II-XII intact, no focal neurological deficits  Data Reviewed: Basic Metabolic Panel:  Recent Labs Lab 05/22/13 1857 05/23/13 0640  NA 140 141  K 3.6* 3.5*  CL 100 103  CO2 26 25  GLUCOSE 152* 119*  BUN 16 15  CREATININE 0.72 0.58  CALCIUM 8.4 8.3*   Liver Function Tests:  Recent Labs Lab 05/22/13 1857  AST 15  ALT 12  ALKPHOS 95  BILITOT 0.3  PROT 6.7  ALBUMIN 2.5*   No results found for this basename: LIPASE, AMYLASE,  in the last 168 hours No results found for this basename: AMMONIA,  in the last 168 hours CBC:  Recent Labs Lab 05/22/13 1857 05/23/13 0640  WBC 10.1 8.4  NEUTROABS 6.9  --   HGB 12.3 12.3  HCT 38.3 38.0  MCV 84.9 85.4  PLT 285 270   Cardiac Enzymes: No results found for this basename: CKTOTAL, CKMB, CKMBINDEX, TROPONINI,  in the last 168 hours BNP (last 3 results) No results found for this basename: PROBNP,  in the last 8760 hours CBG: No results found for this basename: GLUCAP,  in the last 168 hours  Micro Recent Results (from the past 240 hour(s))  MRSA PCR SCREENING     Status: Abnormal   Collection Time    05/23/13 12:09 AM  Result Value Ref Range Status   MRSA by PCR POSITIVE (*) NEGATIVE Final   Comment:            The GeneXpert MRSA Assay (FDA     approved for NASAL specimens     only), is one component of a     comprehensive MRSA colonization     surveillance program. It is not     intended to diagnose MRSA     infection nor to guide or     monitor treatment for     MRSA infections.     RESULT CALLED TO, READ BACK BY AND VERIFIED WITH:     [facility] HospitalM.LANEY RN [id number] 05/23/13 E.GADDY     Studies: Dg Chest 1 View  05/22/2013   CLINICAL DATA:  Chest pain  EXAM: CHEST - 1 VIEW  COMPARISON:  06/11/2011  FINDINGS: Cardiac shadow is stable. Patchy atelectatic changes are noted in the bases bilaterally. No focal  confluent infiltrate is seen. Old left clavicular and rib fractures are noted. No acute bony abnormality is seen.  IMPRESSION: Bibasilar atelectatic changes as described.   Electronically Signed   By: Alcide CleverMark  Lukens M.D.   On: 05/22/2013 20:47   Dg Hip Complete Right  05/22/2013   CLINICAL DATA:  Pain post fall  EXAM: RIGHT HIP - COMPLETE 2+ VIEW  COMPARISON:  06/07/2011 and earlier studies  FINDINGS: There is a basicervical fracture of the right femoral neck, with approximately 2.4 cm of displacement, mild impaction of the fracture fragments. Additionally there are minimally displaced superior and inferior right pubic rami fractures, new since previous study of 06/07/2011. Left femoral head prosthesis is partially visualized. Spondylitic changes the lower lumbar spine. Pelvic vascular calcifications bilaterally.  IMPRESSION: 1. Displaced basicervical right femoral neck fracture. 2. Superior and inferior right pubic rami fractures.   Electronically Signed   By: Oley Balmaniel  Hassell M.D.   On: 05/22/2013 20:51    Scheduled Meds: . antiseptic oral rinse  15 mL Mouth Rinse BID  . arformoterol  15 mcg Nebulization BID  . aspirin EC  81 mg Oral Daily  . Chlorhexidine Gluconate Cloth  6 each Topical Q0600  . enoxaparin (LOVENOX) injection  40 mg Subcutaneous Q24H  . famotidine  20 mg Oral Daily  . ferrous sulfate  325 mg Oral Q breakfast  . fluticasone  2 spray Each Nare Daily  . mupirocin ointment  1 application Nasal BID  . PARoxetine  10 mg Oral Daily   Continuous Infusions: . sodium chloride 75 mL/hr at 05/23/13 0001       Time spent: 35 minutes    Clydia LlanoMutaz Knolan Simien  [facility] Pager [phone] If 7PM-7AM, please contact night-coverage at [email or web address], password [facility] 05/23/2013, 12:22 PM  LOS: 1 day

## 2013-05-23 NOTE — ED Provider Notes (Signed)
Medical screening examination/treatment/procedure(s) were conducted as a shared visit with non-physician practitioner(s) and myself.  I personally evaluated the patient during the encounter.   EKG Interpretation   Date/Time:  Wednesday May 22 2013 19:06:20 EDT Ventricular Rate:  83 PR Interval:    QRS Duration: 104 QT Interval:  422 QTC Calculation: 496 R Axis:   -48 Text Interpretation:  Age not entered, assumed to be  80 years old for  purpose of ECG interpretation Atrial fibrillation Left anterior fascicular  block Abnormal R-wave progression, late transition Borderline T  abnormalities, diffuse leads Borderline prolonged QT interval Confirmed by  Millena Callins  MD, Takara Sermons ([id number]) on 05/22/2013 7:23:01 PM      I interviewed and examined the patient. Drowsy from fentanyl en route by EMS. Lungs are CTAB. Cardiac exam showing irreg irreg rhythm. Abdomen soft.  Plan for admission.   Junius ArgyleForrest S Cyan Moultrie, MD 05/23/13 2107

## 2013-05-23 NOTE — Progress Notes (Signed)
Patient ID: [name], female   DOB: 06/08/1926, 80 y.o.   MRN: [id number] No acute changes overnight.  Resting in bed.  I spoke with her son in detail about her situation.  He understands she does have a broken hip.  She does not ambulate and currently is wheelchair bound at her nursing home.  I explained in detail the risks and benefits of surgery.  He will come to her bedside later this morning to check on her and hopefully we can talk face to face so he can decide whether or not surgery is an option for her.  Will stay NPO for now.

## 2013-05-23 NOTE — Progress Notes (Signed)
Utilization review completed.  

## 2013-05-23 NOTE — Progress Notes (Signed)
INITIAL NUTRITION ASSESSMENT  DOCUMENTATION CODES Per approved criteria  -Not Applicable   INTERVENTION: Diet advancement per MD's discretion. Pt requires Dysphagia 3/Mechanical Soft diet at baseline. Recommend addition of oral nutrition supplements once diet advanced, such as Ensure Pudding PO TID. RD to continue to follow nutrition care plan.  NUTRITION DIAGNOSIS: Increased nutrient needs related to wound healing and hip fx as evidenced by estimated needs.   Goal: Further diet advancement. Intake to meet >90% of estimated nutrition needs.  Monitor:  weight trends, lab trends, I/O's, PO intake, supplement tolerance, diet advancement  Reason for Assessment: MD Consult for Assessment  80 y.o. female  Admitting Dx: R hip fx  ASSESSMENT: PMHx significant for HTN, COPD, afib, dementia. Ad[facility] Work-up reveals R hip fx.  Ortho is following patient, however pt is wheelchair bound at baseline - plan to talk to family today to decide if surgery is an option for her.  Pt unable to answer any of my questions.   Nutrition Focused Physical Exam:  Subcutaneous Fat:  Orbital Region: WNL Upper Arm Region: WNL Thoracic and Lumbar Region: WNL  Muscle:  Temple Region: moderate depletion Clavicle Bone Region: WNL Clavicle and Acromion Bone Region: WNL Scapular Bone Region: n/a Dorsal Hand: WNL Patellar Region: n/a Anterior Thigh Region: n/a Posterior Calf Region: n/a  Edema: n/a  Pt is at nutrition risk given stage II wound, dementia and chronic medical issues.  Height: Ht Readings from Last 1 Encounters:  06/12/11 5\' 6"  (1.676 m)    Weight: Wt Readings from Last 1 Encounters:  05/23/13 163 lb 5.8 oz (74.1 kg)    Ideal Body Weight: 130 lb  % Ideal Body Weight: 125%  Wt Readings from Last 10 Encounters:  05/23/13 163 lb 5.8 oz (74.1 kg)  06/12/11 164 lb 14.5 oz (74.8 kg)  03/24/11 161 lb (73.029 kg)    Usual Body Weight: 164 lb  %  Usual Body Weight: 100%  BMI:  Body mass index is 26.38 kg/(m^2). Overweight  Estimated Nutritional Needs: Kcal: 1450 - 1600 Protein: 75 - 85 g Fluid: 1.5 - 1.8 liters daily  Skin:  stage II to buttocks x 2  Diet Order: NPO  EDUCATION NEEDS: -No education needs identified at this time   Intake/Output Summary (Last 24 hours) at 05/23/13 1013 Last data filed at 05/22/13 2133  Gross per 24 hour  Intake      0 ml  Output     90 ml  Net    -90 ml    Last BM: 4/22  Labs:   Recent Labs Lab 05/22/13 1857 05/23/13 0640  NA 140 141  K 3.6* 3.5*  CL 100 103  CO2 26 25  BUN 16 15  CREATININE 0.72 0.58  CALCIUM 8.4 8.3*  GLUCOSE 152* 119*    CBG (last 3)  No results found for this basename: GLUCAP,  in the last 72 hours  Scheduled Meds: . antiseptic oral rinse  15 mL Mouth Rinse BID  . arformoterol  15 mcg Nebulization BID  . aspirin EC  81 mg Oral Daily  . Chlorhexidine Gluconate Cloth  6 each Topical Q0600  . enoxaparin (LOVENOX) injection  40 mg Subcutaneous Q24H  . famotidine  20 mg Oral Daily  . ferrous sulfate  325 mg Oral Q breakfast  . fluticasone  2 spray Each Nare Daily  . mupirocin ointment  1 application Nasal BID  . PARoxetine  10 mg Oral Daily  Continuous Infusions: . sodium chloride 75 mL/hr at 05/23/13 0001    Past Medical History  Diagnosis Date  . Atrial fib/flutter, transient   . COPD (chronic obstructive pulmonary disease)   . GERD (gastroesophageal reflux disease)   . Pain     Past Surgical History  Procedure Laterality Date  . Nasal sinus surgery    . Cholecystectomy      Jarold MottoSamantha Travaris Kosh MS, RD, LDN Inpatient Registered Dietitian Pager: [phone] After-hours pager: [phone]

## 2013-05-23 NOTE — Progress Notes (Signed)
Patient ID: [name] [name], female   DOB: 09/15/1926, 80 y.o.   MRN: [id number] Still have not seen her son at the bedside.  She has some dementia, but follows commands well.  She does exhibit pain with attempted manipulation of her right hip.  She states to me that she does not want surgery, but of course, her dementia does play a role.  Will let her eat today and plan on surgery tomorrow afternoon 4/24 pending family consent for surgery.

## 2013-05-24 ENCOUNTER — Encounter (HOSPITAL_COMMUNITY): Payer: Self-pay | Admitting: *Deleted

## 2013-05-24 ENCOUNTER — Inpatient Hospital Stay (HOSPITAL_COMMUNITY): Payer: PRIVATE HEALTH INSURANCE | Admitting: Anesthesiology

## 2013-05-24 ENCOUNTER — Inpatient Hospital Stay (HOSPITAL_COMMUNITY): Payer: PRIVATE HEALTH INSURANCE

## 2013-05-24 ENCOUNTER — Encounter (HOSPITAL_COMMUNITY)
Admission: EM | Disposition: A | Discharge status: Skilled Nursing Facility | Payer: Self-pay | Source: Home / Self Care | Attending: Internal Medicine

## 2013-05-24 ENCOUNTER — Encounter (HOSPITAL_COMMUNITY): Payer: PRIVATE HEALTH INSURANCE | Admitting: Anesthesiology

## 2013-05-24 DIAGNOSIS — F0390 Unspecified dementia without behavioral disturbance: Secondary | ICD-10-CM

## 2013-05-24 DIAGNOSIS — J449 Chronic obstructive pulmonary disease, unspecified: Secondary | ICD-10-CM

## 2013-05-24 HISTORY — PX: HIP ARTHROPLASTY: SHX981

## 2013-05-24 LAB — BASIC METABOLIC PANEL
BUN: 14 mg/dL (ref 6–23)
CALCIUM: 8.4 mg/dL (ref 8.4–10.5)
CO2: 24 mEq/L (ref 19–32)
Chloride: 105 mEq/L (ref 96–112)
Creatinine, Ser: 0.57 mg/dL (ref 0.50–1.10)
GFR calc non Af Amer: 83 mL/min — ABNORMAL LOW (ref >90)
GLUCOSE: 106 mg/dL — AB (ref 70–99)
POTASSIUM: 3.5 meq/L — AB (ref 3.7–5.3)
SODIUM: 142 meq/L (ref 137–147)

## 2013-05-24 LAB — CBC
HCT: 35.7 % — ABNORMAL LOW (ref 36.0–46.0)
HEMOGLOBIN: 11.4 g/dL — AB (ref 12.0–15.0)
MCH: 27.1 pg (ref 26.0–34.0)
MCHC: 31.9 g/dL (ref 30.0–36.0)
MCV: 84.8 fL (ref 78.0–100.0)
PLATELETS: 262 10*3/uL (ref 150–400)
RBC: 4.21 MIL/uL (ref 3.87–5.11)
RDW: 13.8 % (ref 11.5–15.5)
WBC: 7.9 10*3/uL (ref 4.0–10.5)

## 2013-05-24 LAB — SURGICAL PCR SCREEN
MRSA, PCR: POSITIVE — AB
STAPHYLOCOCCUS AUREUS: POSITIVE — AB

## 2013-05-24 SURGERY — HEMIARTHROPLASTY, HIP, DIRECT ANTERIOR APPROACH, FOR FRACTURE
Anesthesia: General | Site: Hip | Laterality: Right

## 2013-05-24 MED ORDER — SODIUM CHLORIDE 0.9 % IR SOLN
Status: DC | PRN
Start: 2013-05-24 — End: 2013-05-24
  Administered 2013-05-24: 1000 mL

## 2013-05-24 MED ORDER — FENTANYL CITRATE 0.05 MG/ML IJ SOLN
INTRAMUSCULAR | Status: DC | PRN
  Administered 2013-05-24 (×2): 50 ug via INTRAVENOUS
  Administered 2013-05-24 (×2): 25 ug via INTRAVENOUS

## 2013-05-24 MED ORDER — METOPROLOL TARTRATE 1 MG/ML IV SOLN: 2.5000 mg | INTRAVENOUS | Status: DC

## 2013-05-24 MED ORDER — ONDANSETRON HCL 4 MG/2ML IJ SOLN: 4.0000 mg | Freq: Four times a day (QID) | INTRAMUSCULAR | Status: DC | PRN

## 2013-05-24 MED ORDER — ARTIFICIAL TEARS OP OINT
TOPICAL_OINTMENT | OPHTHALMIC | Status: DC | PRN
  Administered 2013-05-24: 1 via OPHTHALMIC

## 2013-05-24 MED ORDER — METOPROLOL TARTRATE 1 MG/ML IV SOLN
2.5000 mg | INTRAVENOUS | Status: DC | PRN
  Administered 2013-05-24 – 2013-05-25 (×3): 2.5 mg via INTRAVENOUS
  Filled 2013-05-24: qty 5

## 2013-05-24 MED ORDER — EPHEDRINE SULFATE 50 MG/ML IJ SOLN
INTRAMUSCULAR | Status: AC
  Filled 2013-05-24: qty 1

## 2013-05-24 MED ORDER — ONDANSETRON HCL 4 MG/2ML IJ SOLN
INTRAMUSCULAR | Status: DC | PRN
  Administered 2013-05-24: 4 mg via INTRAVENOUS

## 2013-05-24 MED ORDER — HYDROCODONE-ACETAMINOPHEN 5-325 MG PO TABS: 1.0000 | ORAL_TABLET | Freq: Four times a day (QID) | ORAL | Status: DC | PRN

## 2013-05-24 MED ORDER — METHOCARBAMOL 100 MG/ML IJ SOLN
500.0000 mg | Freq: Four times a day (QID) | INTRAVENOUS | Status: DC | PRN
  Filled 2013-05-24: qty 5

## 2013-05-24 MED ORDER — ROCURONIUM BROMIDE 50 MG/5ML IV SOLN
INTRAVENOUS | Status: AC
  Filled 2013-05-24: qty 1

## 2013-05-24 MED ORDER — MENTHOL 3 MG MT LOZG
1.0000 | LOZENGE | OROMUCOSAL | Status: DC | PRN
  Filled 2013-05-24: qty 9

## 2013-05-24 MED ORDER — CEFAZOLIN SODIUM-DEXTROSE 2-3 GM-% IV SOLR
2.0000 g | Freq: Four times a day (QID) | INTRAVENOUS | Status: AC
  Administered 2013-05-25 (×2): 2 g via INTRAVENOUS
  Filled 2013-05-24 (×2): qty 50

## 2013-05-24 MED ORDER — PROPOFOL 10 MG/ML IV BOLUS
INTRAVENOUS | Status: DC | PRN
  Administered 2013-05-24: 100 mg via INTRAVENOUS

## 2013-05-24 MED ORDER — LACTATED RINGERS IV SOLN
INTRAVENOUS | Status: DC | PRN
  Administered 2013-05-24 (×2): via INTRAVENOUS

## 2013-05-24 MED ORDER — MORPHINE SULFATE 2 MG/ML IJ SOLN: 0.5000 mg | INTRAMUSCULAR | Status: DC | PRN

## 2013-05-24 MED ORDER — METOPROLOL TARTRATE 1 MG/ML IV SOLN
5.0000 mg | Freq: Four times a day (QID) | INTRAVENOUS | Status: DC
  Administered 2013-05-25: 5 mg via INTRAVENOUS
  Filled 2013-05-24 (×5): qty 5

## 2013-05-24 MED ORDER — METOCLOPRAMIDE HCL 5 MG/ML IJ SOLN
5.0000 mg | Freq: Three times a day (TID) | INTRAMUSCULAR | Status: DC | PRN
  Filled 2013-05-24: qty 2

## 2013-05-24 MED ORDER — PHENYLEPHRINE HCL 10 MG/ML IJ SOLN
10.0000 mg | INTRAVENOUS | Status: DC | PRN
  Administered 2013-05-24: 10 ug/min via INTRAVENOUS

## 2013-05-24 MED ORDER — IPRATROPIUM-ALBUTEROL 0.5-2.5 (3) MG/3ML IN SOLN
3.0000 mL | Freq: Three times a day (TID) | RESPIRATORY_TRACT | Status: DC
  Administered 2013-05-25 – 2013-05-27 (×7): 3 mL via RESPIRATORY_TRACT
  Filled 2013-05-24 (×9): qty 3

## 2013-05-24 MED ORDER — FENTANYL CITRATE 0.05 MG/ML IJ SOLN
INTRAMUSCULAR | Status: AC
  Filled 2013-05-24: qty 5

## 2013-05-24 MED ORDER — EPHEDRINE SULFATE 50 MG/ML IJ SOLN
INTRAMUSCULAR | Status: DC | PRN
  Administered 2013-05-24 (×3): 10 mg via INTRAVENOUS

## 2013-05-24 MED ORDER — ONDANSETRON HCL 4 MG PO TABS: 4.0000 mg | ORAL_TABLET | Freq: Four times a day (QID) | ORAL | Status: DC | PRN

## 2013-05-24 MED ORDER — METOPROLOL TARTRATE 1 MG/ML IV SOLN
5.0000 mg | Freq: Four times a day (QID) | INTRAVENOUS | Status: DC
Start: 2013-05-24 — End: 2013-05-24
  Administered 2013-05-24: 2.5 mg via INTRAVENOUS

## 2013-05-24 MED ORDER — DOCUSATE SODIUM 100 MG PO CAPS
100.0000 mg | ORAL_CAPSULE | Freq: Two times a day (BID) | ORAL | Status: DC
  Administered 2013-05-25 – 2013-05-27 (×5): 100 mg via ORAL
  Filled 2013-05-24 (×6): qty 1

## 2013-05-24 MED ORDER — METOPROLOL TARTRATE 1 MG/ML IV SOLN
INTRAVENOUS | Status: DC | PRN
  Administered 2013-05-24: 1 mg via INTRAVENOUS
  Administered 2013-05-24: 2 mg via INTRAVENOUS
  Administered 2013-05-24: 1 mg via INTRAVENOUS

## 2013-05-24 MED ORDER — ACETAMINOPHEN 325 MG PO TABS: 650.0000 mg | ORAL_TABLET | Freq: Four times a day (QID) | ORAL | Status: DC | PRN

## 2013-05-24 MED ORDER — LIDOCAINE HCL (CARDIAC) 20 MG/ML IV SOLN
INTRAVENOUS | Status: DC | PRN
  Administered 2013-05-24: 80 mg via INTRAVENOUS

## 2013-05-24 MED ORDER — STERILE WATER FOR INJECTION IJ SOLN
INTRAMUSCULAR | Status: AC
  Filled 2013-05-24: qty 10

## 2013-05-24 MED ORDER — SUCCINYLCHOLINE CHLORIDE 20 MG/ML IJ SOLN
INTRAMUSCULAR | Status: DC | PRN
  Administered 2013-05-24: 100 mg via INTRAVENOUS

## 2013-05-24 MED ORDER — METHOCARBAMOL 500 MG PO TABS
500.0000 mg | ORAL_TABLET | Freq: Four times a day (QID) | ORAL | Status: DC | PRN
  Filled 2013-05-24: qty 1

## 2013-05-24 MED ORDER — METOPROLOL TARTRATE 1 MG/ML IV SOLN
INTRAVENOUS | Status: AC
  Filled 2013-05-24: qty 5

## 2013-05-24 MED ORDER — ALBUTEROL SULFATE (2.5 MG/3ML) 0.083% IN NEBU: 2.5000 mg | INHALATION_SOLUTION | RESPIRATORY_TRACT | Status: DC | PRN

## 2013-05-24 MED ORDER — ASPIRIN EC 325 MG PO TBEC
325.0000 mg | DELAYED_RELEASE_TABLET | Freq: Every day | ORAL | Status: DC
  Administered 2013-05-25 – 2013-05-27 (×3): 325 mg via ORAL
  Filled 2013-05-24 (×6): qty 1

## 2013-05-24 MED ORDER — ACETAMINOPHEN 650 MG RE SUPP: 650.0000 mg | Freq: Four times a day (QID) | RECTAL | Status: DC | PRN

## 2013-05-24 MED ORDER — PROPOFOL 10 MG/ML IV BOLUS
INTRAVENOUS | Status: AC
  Filled 2013-05-24: qty 20

## 2013-05-24 MED ORDER — CEFAZOLIN SODIUM-DEXTROSE 2-3 GM-% IV SOLR
INTRAVENOUS | Status: AC
  Administered 2013-05-24: 2 g via INTRAVENOUS
  Filled 2013-05-24: qty 50

## 2013-05-24 MED ORDER — FENTANYL CITRATE 0.05 MG/ML IJ SOLN: 25.0000 ug | INTRAMUSCULAR | Status: DC | PRN

## 2013-05-24 MED ORDER — PHENOL 1.4 % MT LIQD
1.0000 | OROMUCOSAL | Status: DC | PRN
Start: 2013-05-24 — End: 2013-05-27
  Filled 2013-05-24: qty 177

## 2013-05-24 MED ORDER — METOCLOPRAMIDE HCL 10 MG PO TABS: 5.0000 mg | ORAL_TABLET | Freq: Three times a day (TID) | ORAL | Status: DC | PRN

## 2013-05-24 SURGICAL SUPPLY — 42 items
BLADE SAGITTAL (BLADE) ×3
BLADE SAW THK.89X75X18XSGTL (BLADE) ×1 IMPLANT
CAPT HIP FX BIPOLAR/UNIPOLAR ×2 IMPLANT
COVER SURGICAL LIGHT HANDLE (MISCELLANEOUS) ×3 IMPLANT
DRAPE HIP W/POCKET STRL (DRAPE) ×3 IMPLANT
DRAPE INCISE IOBAN 85X60 (DRAPES) ×6 IMPLANT
DRAPE U-SHAPE 47X51 STRL (DRAPES) ×3 IMPLANT
DRSG MEPILEX BORDER 4X12 (GAUZE/BANDAGES/DRESSINGS) ×2 IMPLANT
DRSG MEPILEX BORDER 4X8 (GAUZE/BANDAGES/DRESSINGS) ×3 IMPLANT
DURAPREP 26ML APPLICATOR (WOUND CARE) ×3 IMPLANT
ELECT BLADE 4.0 EZ CLEAN MEGAD (MISCELLANEOUS) ×3
ELECT BLADE 6.5 EXT (BLADE) IMPLANT
ELECT CAUTERY BLADE 6.4 (BLADE) ×3 IMPLANT
ELECT REM PT RETURN 9FT ADLT (ELECTROSURGICAL) ×3
ELECTRODE BLDE 4.0 EZ CLN MEGD (MISCELLANEOUS) IMPLANT
ELECTRODE REM PT RTRN 9FT ADLT (ELECTROSURGICAL) ×1 IMPLANT
EVACUATOR 1/8 PVC DRAIN (DRAIN) IMPLANT
GAUZE XEROFORM 5X9 LF (GAUZE/BANDAGES/DRESSINGS) ×2 IMPLANT
GLOVE BIOGEL PI IND STRL 7.5 (GLOVE) ×1 IMPLANT
GLOVE BIOGEL PI IND STRL 8 (GLOVE) ×1 IMPLANT
GLOVE BIOGEL PI INDICATOR 7.5 (GLOVE) ×2
GLOVE BIOGEL PI INDICATOR 8 (GLOVE) ×2
GLOVE ORTHO TXT STRL SZ7.5 (GLOVE) ×6 IMPLANT
GOWN STRL REUS W/ TWL LRG LVL3 (GOWN DISPOSABLE) ×2 IMPLANT
GOWN STRL REUS W/ TWL XL LVL3 (GOWN DISPOSABLE) ×4 IMPLANT
GOWN STRL REUS W/TWL LRG LVL3 (GOWN DISPOSABLE) ×6
GOWN STRL REUS W/TWL XL LVL3 (GOWN DISPOSABLE) ×12
KIT BASIN OR (CUSTOM PROCEDURE TRAY) ×3 IMPLANT
KIT ROOM TURNOVER OR (KITS) ×3 IMPLANT
MANIFOLD NEPTUNE II (INSTRUMENTS) ×3 IMPLANT
NS IRRIG 1000ML POUR BTL (IV SOLUTION) ×3 IMPLANT
PACK TOTAL JOINT (CUSTOM PROCEDURE TRAY) ×3 IMPLANT
PAD ARMBOARD 7.5X6 YLW CONV (MISCELLANEOUS) ×6 IMPLANT
STAPLER VISISTAT 35W (STAPLE) ×3 IMPLANT
SUT ETHIBOND NAB CT1 #1 30IN (SUTURE) ×6 IMPLANT
SUT VIC AB 1 CTB1 27 (SUTURE) ×8 IMPLANT
SUT VIC AB 2-0 CT1 27 (SUTURE) ×9
SUT VIC AB 2-0 CT1 TAPERPNT 27 (SUTURE) ×2 IMPLANT
TOWEL OR 17X24 6PK STRL BLUE (TOWEL DISPOSABLE) ×3 IMPLANT
TOWEL OR 17X26 10 PK STRL BLUE (TOWEL DISPOSABLE) ×3 IMPLANT
TRAY FOLEY CATH 16FRSI W/METER (SET/KITS/TRAYS/PACK) IMPLANT
WATER STERILE IRR 1000ML POUR (IV SOLUTION) ×9 IMPLANT

## 2013-05-24 NOTE — Consult Note (Signed)
WOC wound consult note Reason for Consult: Consult requested for sacrum. Assessed site with daughter present since pt came from nursing home and she wanted to look at the site.  Wound type: Sacrum/gluteal fold with partial thickness fissure Pressure Ulcer POA: This is NOT a pressure ulcer, but was present on admission. Measurement: 1X.1X.1cm  Wound bed: Pink and moist Drainage (amount, consistency, odor) Small amt yellow drainage, no odor Periwound: Pt has generalized erythremia and maceration consistent with moisture associated skin damage to buttocks.   Dressing procedure/placement/frequency: Foam dressing to absorb drainage and promote healing. Please re-consult if further assistance is needed.  Thank-you,  Cammie Mcgeeawn Beaumont Austad MSN, RN, CWOCN, Marked TreeWCN-AP, CNS [phone]

## 2013-05-24 NOTE — Progress Notes (Signed)
Pt. HR 140's non-sustained.  Sustaining in 120's.  Pt. In A-flutter from NSR.  MD aware.  Orders rec'd.  MD initially ordered Lopressor 5mg .- changed to 2.5mg  prior to administration.

## 2013-05-24 NOTE — Brief Op Note (Signed)
05/22/2013 - 05/24/2013  6:35 PM  PATIENT:  Lajoyce Cornersoberta J Cappelli  80 y.o. female  PRE-OPERATIVE DIAGNOSIS:  Right hip femoral neck fracture  POST-OPERATIVE DIAGNOSIS:  Right hip femoral neck fracture  PROCEDURE:  Procedure(s): RIGHT HIP HEMIARTHROPLASTY (Right)  SURGEON:  Surgeon(s) and Role:    * Kathryne Hitchhristopher Y Daleisa Halperin, MD - Primary  PHYSICIAN ASSISTANT: Rexene EdisonGil Clark, PA-C  ANESTHESIA:   general  EBL:  Total I/O In: 981.3 [I.V.:931.3; IV Piggyback:50] Out: 625 [Urine:625]  BLOOD ADMINISTERED:none  DRAINS: none   LOCAL MEDICATIONS USED:  NONE  SPECIMEN:  No Specimen  DISPOSITION OF SPECIMEN:  N/A  COUNTS:  YES  TOURNIQUET:  * No tourniquets in log *  DICTATION: .Other Dictation: Dictation Number [id number]  PLAN OF CARE: Admit to inpatient   PATIENT DISPOSITION:  PACU - hemodynamically stable.   Delay start of Pharmacological VTE agent (>24hrs) due to surgical blood loss or risk of bleeding: no

## 2013-05-24 NOTE — Transfer of Care (Signed)
Immediate Anesthesia Transfer of Care Note  Patient: [name] [name]  Procedure(s) Performed: Procedure(s): RIGHT HIP HEMIARTHROPLASTY (Right)  Patient Location: PACU  Anesthesia Type:General  Level of Consciousness: awake, patient cooperative and lethargic  Airway & Oxygen Therapy: Patient Spontanous Breathing and Patient connected to face mask oxygen  Post-op Assessment: Report given to PACU RN, Post -op Vital signs reviewed and stable and Patient moving all extremities  Post vital signs: Reviewed and stable  Complications: No apparent anesthesia complications

## 2013-05-24 NOTE — Anesthesia Procedure Notes (Signed)
Procedure Name: Intubation Date/Time: 05/24/2013 5:14 PM Performed by: Delia ChimesVOSH, Shelden Raborn E Pre-anesthesia Checklist: Patient identified, Timeout performed, Emergency Drugs available, Suction available and Patient being monitored Patient Re-evaluated:Patient Re-evaluated prior to inductionOxygen Delivery Method: Circle system utilized Preoxygenation: Pre-oxygenation with 100% oxygen Intubation Type: IV induction Ventilation: Mask ventilation without difficulty Laryngoscope Size: Mac and 3 Grade View: Grade I Tube type: Oral Tube size: 7.0 mm Number of attempts: 1 Airway Equipment and Method: Stylet Placement Confirmation: ETT inserted through vocal cords under direct vision,  positive ETCO2 and breath sounds checked- equal and bilateral Secured at: 22 cm Tube secured with: Tape Dental Injury: Teeth and Oropharynx as per pre-operative assessment  Comments: Pt. Edentulous- Lips in pre-op condition.

## 2013-05-24 NOTE — Anesthesia Postprocedure Evaluation (Signed)
  Anesthesia Post-op Note  Patient: [name] [name]  Procedure(s) Performed: Procedure(s): RIGHT HIP HEMIARTHROPLASTY (Right)  Patient Location: PACU  Anesthesia Type:General  Level of Consciousness: awake, alert  and oriented  Airway and Oxygen Therapy: Patient connected to nasal cannula oxygen  Post-op Pain: none  Post-op Assessment: Post-op Vital signs reviewed and Patient's Cardiovascular Status Stable  Post-op Vital Signs: Reviewed and stable  Last Vitals:  Filed Vitals:   05/24/13 2000  BP: 140/60  Pulse: 29  Temp:   Resp: 15    Complications: No apparent anesthesia complications

## 2013-05-24 NOTE — Progress Notes (Signed)
Patient ID: [name], female   DOB: 09/09/1926, 80 y.o.   MRN: [id number] I spoke to her son in length last evening and he understands fully her situation.  He gives informed consent to proceed to the OR later today for a right hip hemiarthroplasty to treat her right femoral neck/hip fracture.

## 2013-05-24 NOTE — Progress Notes (Addendum)
[facility] PROGRESS NOTE  [name] [name] ZOX:[id number] DOB: 05/11/1926 DOA: 05/22/2013 PCP: [name] [name]  Assessment/Plan: 1. Right hip fracture   Orthopedic surgery scheduled today.  Pain control with morphine injection 0.5 mg q 2 hours PRN  Discussed patient's condition with son.  He understands the surgery is high risk and wishes to proceed.  2. UTI   UA postive for Infection on 05/22/2013   Continue Rocephin IV  Culture pending.  4. COPD  continue oxygen, albuterol PRN  5. Dementia    Appears at baseline  6. atrial fibrillation with mild rapid ventricular response  Patient has a hx of Atrial Fibrillation (not on anticoagulation)  Had mild RVR in the low 100s this morning, given IV Lopressor with good control. Since n.p.o., we'll continue with IV Lopressor. Continue to monitor in telemetry.  7. Fracture of the right pubis     - Stable, nonoperative. Orthopedic following.  8 stage II decubitus ulcer - Obtain wound care consult. Likely present prior to admission.  9. Chronic respiratory failure - On 3 L of oxygen at home, this is likely secondary to COPD.  Code Status: DNR Family Communication: Son Disposition Plan: Inpatient    Consultants: Orthopedic Surgery.  Procedures: Right hemi arthroplasty.  Antibiotics:  Rocephin  HPI/Subjective: [name] [name] is an 80 year old female who [facility].  She sustained a Right hip fracture.  Patient has dementia and unable to provide an accurate history.  No family was present at bedside. Patient was given 100 mcg of fentanyl and was noted to be hypotensive (SBP 90's) in the emergency department   Objective: Filed Vitals:   05/24/13 1209  BP: 143/99  Pulse: 113  Temp: 98.5 F (36.9 C)  Resp: 20    Intake/Output Summary (Last 24 hours) at 05/24/13 1307 Last data filed at 05/24/13 1210  Gross per 24 hour  Intake 2298.75 ml  Output    600 ml  Net  1698.75 ml   Filed Weights   05/22/13 2255 05/23/13 0520 05/24/13 0500  Weight: 74.1 kg (163 lb 5.8 oz) 74.1 kg (163 lb 5.8 oz) 74 kg (163 lb 2.3 oz)    Exam:  Physical Exam  Nursing note and vitals reviewed. Constitutional: She is oriented to person, place, and time. She appears well-developed and well-nourished. No distress.  Neck: No JVD present.  Cardiovascular: An irregularly irregular rhythm present. Tachycardia present.   Respiratory: Effort normal and breath sounds normal. No stridor. No respiratory distress. She has no wheezes. She exhibits no tenderness.  GI: Soft. Bowel sounds are normal. She exhibits no distension. There is no tenderness.  Musculoskeletal:       Right hip: She exhibits tenderness and swelling.  Neurological: She is alert and oriented to person, place, and time. No cranial nerve deficit.  Psychiatric: She has a normal mood and affect. Her speech is normal and behavior is normal. Thought content normal. Cognition and memory are impaired.    Data Reviewed: Basic Metabolic Panel:  Recent Labs Lab 05/22/13 1857 05/23/13 0640 05/24/13 0752  NA 140 141 142  K 3.6* 3.5* 3.5*  CL 100 103 105  CO2 26 25 24   GLUCOSE 152* 119* 106*  BUN 16 15 14   CREATININE 0.72 0.58 0.57  CALCIUM 8.4 8.3* 8.4   Liver Function Tests:  Recent Labs Lab 05/22/13 1857  AST 15  ALT 12  ALKPHOS 95  BILITOT 0.3  PROT 6.7  ALBUMIN 2.5*   CBC:  Recent Labs Lab 05/22/13 1857 05/23/13 0640 05/24/13 0752  WBC 10.1 8.4 7.9  NEUTROABS 6.9  --   --   HGB 12.3 12.3 11.4*  HCT 38.3 38.0 35.7*  MCV 84.9 85.4 84.8  PLT 285 270 262     Recent Results (from the past 240 hour(s))  MRSA PCR SCREENING     Status: Abnormal   Collection Time    05/23/13 12:09 AM      Result Value Ref Range Status   MRSA by PCR POSITIVE (*) NEGATIVE Final   Comment:            The GeneXpert MRSA Assay (FDA     approved for NASAL specimens     only), is one component of a      comprehensive MRSA colonization     surveillance program. It is not     intended to diagnose MRSA     infection nor to guide or     monitor treatment for     MRSA infections.     RESULT CALLED TO, READ BACK BY AND VERIFIED WITH:     [name] [name] [id number] 05/23/13 E.GADDY  SURGICAL PCR SCREEN     Status: Abnormal   Collection Time    05/23/13  8:54 PM      Result Value Ref Range Status   MRSA, PCR POSITIVE (*) NEGATIVE Final   Staphylococcus aureus POSITIVE (*) NEGATIVE Final   Comment:            The Xpert SA Assay (FDA     approved for NASAL specimens     in patients over 78 years of age),     is one component of     a comprehensive surveillance     program.  Test performance has     been validated by [facility] for patients greater     than or equal to 78 year old.     It is not intended     to diagnose infection nor to     guide or monitor treatment.     Studies: Dg Chest 1 View  05/22/2013   CLINICAL DATA:  Chest pain  EXAM: CHEST - 1 VIEW  COMPARISON:  06/11/2011  FINDINGS: Cardiac shadow is stable. Patchy atelectatic changes are noted in the bases bilaterally. No focal confluent infiltrate is seen. Old left clavicular and rib fractures are noted. No acute bony abnormality is seen.  IMPRESSION: Bibasilar atelectatic changes as described.   Electronically Signed   By: [name] CleverMark  Lukens M.D.   On: 05/22/2013 20:47   Dg Hip Complete Right  05/22/2013   CLINICAL DATA:  Pain post fall  EXAM: RIGHT HIP - COMPLETE 2+ VIEW  COMPARISON:  06/07/2011 and earlier studies  FINDINGS: There is a basicervical fracture of the right femoral neck, with approximately 2.4 cm of displacement, mild impaction of the fracture fragments. Additionally there are minimally displaced superior and inferior right pubic rami fractures, new since previous study of 06/07/2011. Left femoral head prosthesis is partially visualized. Spondylitic changes the lower lumbar spine. Pelvic vascular calcifications bilaterally.   IMPRESSION: 1. Displaced basicervical right femoral neck fracture. 2. Superior and inferior right pubic rami fractures.   Electronically Signed   By: [name] Balmaniel  Hassell M.D.   On: 05/22/2013 20:51    Scheduled Meds: . antiseptic oral rinse  15 mL Mouth Rinse BID  . arformoterol  15 mcg Nebulization BID  .  aspirin EC  81 mg Oral Daily  . cefTRIAXone (ROCEPHIN)  IV  1 g Intravenous Q24H  . Chlorhexidine Gluconate Cloth  6 each Topical Q0600  . enoxaparin (LOVENOX) injection  40 mg Subcutaneous Q24H  . famotidine  20 mg Oral Daily  . ferrous sulfate  325 mg Oral Q breakfast  . fluticasone  2 spray Each Nare Daily  . metoprolol      . metoprolol  5 mg Intravenous 4 times per day  . mupirocin ointment  1 application Nasal BID  . PARoxetine  10 mg Oral Daily   Continuous Infusions: . sodium chloride 75 mL/hr at 05/24/13 1610    Principal Problem:   Hip fracture Active Problems:   UTI (lower urinary tract infection)   COPD (chronic obstructive pulmonary disease)   Dementia   Hypotension, unspecified   Atrial fibrillation    Time spent: [address] [place], [place] [phone]  [facility]  If 7PM-7AM, please contact night-coverage at [email or web address], password [facility] 05/24/2013, 1:07 PM  LOS: 2 days   Attending - Patient seen and examined, agree with the above assessment and plan. Admitted with right hip fracture along with right pubis fracture. Scheduled for today, had an episode of RVR with mild rapid ventricular response, treated with IV Lopressor. Since n.p.o., we'll continue with scheduled IV Lopressor use, we'll transition to oral Lopressor or Cardizem once able to take oral medications. I had a discussion with the patient's son [name] [name] over the phone, explained that given patient's advanced age, frailty, cardiac issues that the patient will be at high risk for the proposed surgical procedure, he understood, accepted all risks and is willing  to proceed with surgery. Since rate is controlled with IV Lopressor, stable for surgery, no further workup needed at this time.  Windell Norfolk [name]

## 2013-05-24 NOTE — Clinical Documentation Improvement (Addendum)
   Query #1   Please document if the finding of "Superior and Inferior Right Pubic Rami Fractures" are clinically significant to this admission.   Query #2  "2, Stage 2 Pressure Ulcers on bottom" is documented in a nursing progress note  Please document the Stage, Location and if Present on Admission in the progress notes and discharge summary if you agree with the nursing assessment.   Query #3  "COPD on 3 liters oxygen" documented in H&P  Facility Medications include:  - Albutrol Nebs  - Brovana Nebs  - Duoneb Nebs  Possible Clinical Conditions:  - Chronic Respiratory Failure  - Other Condition  - Unable to Clinically Determine    Thank You,  Jerral Ralphathy R Quantisha Marsicano ,RN BSN CCDS Certified Clinical Documentation Specialist:  [phone] [facility]- Health Information Management

## 2013-05-24 NOTE — Anesthesia Preprocedure Evaluation (Addendum)
Anesthesia Evaluation  Patient identified by MRN, date of birth, ID band Patient awake and Patient confused    Reviewed: Allergy & Precautions, H&P , NPO status , Patient's Chart, lab work & pertinent test results, reviewed documented beta blocker date and time   Airway Mallampati: II TM Distance: >3 FB Neck ROM: Full    Dental no notable dental hx. (+) Edentulous Upper, Edentulous Lower, Dental Advisory Given   Pulmonary pneumonia -, COPD oxygen dependent,    Pulmonary exam normal       Cardiovascular + dysrhythmias Atrial Fibrillation Rhythm:Irregular Rate:Normal     Neuro/Psych PSYCHIATRIC DISORDERS negative neurological ROS     GI/Hepatic Neg liver ROS, GERD-  Medicated and Controlled,  Endo/Other  negative endocrine ROS  Renal/GU Renal disease  negative genitourinary   Musculoskeletal   Abdominal   Peds  Hematology negative hematology ROS (+) anemia ,   Anesthesia Other Findings   Reproductive/Obstetrics negative OB ROS                          Anesthesia Physical Anesthesia Plan  ASA: III  Anesthesia Plan: General   Post-op Pain Management:    Induction: Intravenous  Airway Management Planned: Oral ETT  Additional Equipment:   Intra-op Plan:   Post-operative Plan: Extubation in OR  Informed Consent: I have reviewed the patients History and Physical, chart, labs and discussed the procedure including the risks, benefits and alternatives for the proposed anesthesia with the patient or authorized representative who has indicated his/her understanding and acceptance.   Dental advisory given and Consent reviewed with POA  Plan Discussed with: CRNA, Anesthesiologist and Surgeon  Anesthesia Plan Comments:         Anesthesia Quick Evaluation

## 2013-05-24 NOTE — Progress Notes (Signed)
Orthopedic Tech Progress Note Patient Details:  [name] 03/01/1926 [id number] Overhead frame with trapeze bar applied to patients bed per orders. Frame is pinned into bed frame . Patient is instructed on the use of the frame. All questions are answered. If further questions arise patient will contact nursing staff.       Early CharsWilliam Anthony [name] 05/24/2013, 11:32 AM

## 2013-05-25 LAB — URINE CULTURE
CULTURE: NO GROWTH
Colony Count: NO GROWTH

## 2013-05-25 LAB — CBC
HCT: 33.8 % — ABNORMAL LOW (ref 36.0–46.0)
Hemoglobin: 10.7 g/dL — ABNORMAL LOW (ref 12.0–15.0)
MCH: 26.9 pg (ref 26.0–34.0)
MCHC: 31.7 g/dL (ref 30.0–36.0)
MCV: 84.9 fL (ref 78.0–100.0)
PLATELETS: 267 10*3/uL (ref 150–400)
RBC: 3.98 MIL/uL (ref 3.87–5.11)
RDW: 13.6 % (ref 11.5–15.5)
WBC: 8.4 10*3/uL (ref 4.0–10.5)

## 2013-05-25 LAB — BASIC METABOLIC PANEL
BUN: 11 mg/dL (ref 6–23)
CALCIUM: 8.2 mg/dL — AB (ref 8.4–10.5)
CO2: 24 mEq/L (ref 19–32)
CREATININE: 0.5 mg/dL (ref 0.50–1.10)
Chloride: 106 mEq/L (ref 96–112)
GFR calc Af Amer: >90 mL/min (ref >90)
GFR calc non Af Amer: 86 mL/min — ABNORMAL LOW (ref >90)
GLUCOSE: 119 mg/dL — AB (ref 70–99)
Potassium: 3.6 mEq/L — ABNORMAL LOW (ref 3.7–5.3)
Sodium: 143 mEq/L (ref 137–147)

## 2013-05-25 MED ORDER — DILTIAZEM HCL ER 180 MG PO CP24
180.0000 mg | ORAL_CAPSULE | Freq: Every day | ORAL | Status: DC
  Administered 2013-05-25 – 2013-05-27 (×3): 180 mg via ORAL
  Filled 2013-05-25 (×3): qty 1

## 2013-05-25 MED ORDER — METOPROLOL TARTRATE 25 MG PO TABS
25.0000 mg | ORAL_TABLET | Freq: Two times a day (BID) | ORAL | Status: DC
  Administered 2013-05-25 – 2013-05-27 (×4): 25 mg via ORAL
  Filled 2013-05-25 (×7): qty 1

## 2013-05-25 MED ORDER — SENNA 8.6 MG PO TABS
2.0000 | ORAL_TABLET | Freq: Every day | ORAL | Status: DC
  Administered 2013-05-25 – 2013-05-26 (×2): 17.2 mg via ORAL
  Filled 2013-05-25 (×3): qty 2

## 2013-05-25 MED ORDER — MEMANTINE HCL 10 MG PO TABS
10.0000 mg | ORAL_TABLET | Freq: Two times a day (BID) | ORAL | Status: DC
  Administered 2013-05-25 – 2013-05-27 (×5): 10 mg via ORAL
  Filled 2013-05-25 (×6): qty 1

## 2013-05-25 NOTE — Op Note (Signed)
NAMConard Novak:  [place], [place]                ACCOUNT NO.:  [id number]  MEDICAL RECORD NO.:  [id number]  LOCATION:  [id number]                        FACILITY:  [facility]  PHYSICIAN:  Vanita PandaChristopher Y. Magnus IvanBlackman, M.D.DATE OF BIRTH:  [birth year]  DATE OF PROCEDURE:  05/24/2013 DATE OF DISCHARGE:                              OPERATIVE REPORT   PREOPERATIVE DIAGNOSIS:  Right hip displaced femoral neck fracture.  POSTOPERATIVE DIAGNOSIS:  Right hip displaced femoral neck fracture.  PROCEDURE:  Right hip hemiarthroplasty.  IMPLANTS:  DePuy Summit Basic press-fit stem size 6, size 48 unipolar head with a +0 tapered spacer.  SURGEONS:  Vanita PandaChristopher Y. Magnus IvanBlackman, M.D.  ASSISTANT:  Richardean CanalGilbert Clark, PA-C  ANESTHESIA:  General.  BLOOD LOSS:  Less than 200 mL.  ANTIBIOTICS:  2 g IV Ancef.  COMPLICATIONS:  None.  INDICATIONS:  Ms. Lennice Sitesope is an elderly and demented 80 year old nursing home patient who has atrial fibrillation and COPD.  She does not really walk much and sustained a mechanical fall.  She mainly gets around in a wheelchair.  Two days later on Wednesday of this week, she was sent to [facility] due to obvious pain and discomfort and the right leg that was shortened and externally rotated.  She was found to have a displaced femoral neck fracture.  After discussion with her son, it was felt that once she is medically stabilized that she would benefit from a right hip hemiarthroplasty.  He felt that she was having a lot of discomfort with the right hip and sitting up was quite uncomfortable and he wanted to be able to still get in a wheelchair safely.  He understands that given her heart issues and lung issues, this is going to be difficult for her. She is not a DNR.  He understands that she may end up on the ventilator even after surgery, but he feels like she is going to suffer so much with poor quality of life if we do not do something and he points to the fact she is already certain to develop some  breakdown on her backside. He understands fully the risks of the surgery and does wish for us to proceed.  PROCEDURE DESCRIPTION:  After informed consent was obtained, appropriate right hip was marked.  She was brought to the operating room and general anesthesia was obtained after she was placed supine on the operating table.  She was then turned into the lateral decubitus position with the right operative hip up and an axillary roll in place and padding of the down on operative left leg.  There was appropriate positioning of the head and neck as well.  Her right operative hip was then prepped and draped with DuraPrep and sterile drapes.  A time-out was called to identify correct patient, correct right hip.  We then made an incision over the greater trochanter and carried this proximally and distally.  I dissected down to the iliotibial band.  The iliotibial band was divided longitudinally.  We next proceeded with a direct anterior approach to the hip.  A Charnley retractor was placed and then I took down the sleeve of gluteus medius and minimus tendon off of the greater trochanter and reflected  these anteriorly.  I dissected down the hip capsule and divided the hip capsule easily and found a large hematoma. No displaced femoral neck fracture.  We then made a femoral neck cut with an oscillating saw just proximal to the lesser trochanter and we placed a corkscrew guide in the femoral head and removed the femoral head.  We then trialed a size 48 head and felt this was nice and stable. We then turned attention to the femur.  With the leg flexed and externally rotated off the table into a leg bag, we were able to use initiating reamer to open up the femoral canal.  A canal finder followed by a lateralizing reamer.  We then began broaching from a size 1 broach all the way up to a size 6.  With a press-fit broach, we trialed a standard 48 head with a +0 spacer, a 48 head with a +0 space,  reduced into the pelvis and it was stable throughout its arc of motion.  We then dislocated the hip and removed the trial components.  I placed a real DePuy Summit Basic press-fit femoral component size 6, a real metal size 40 unipolar head with a +0 tapered spacer and we reduced this back in the acetabulum and it was stable.  We then copiously irrigated the soft tissues with normal saline solution, closed the joint capsule with interrupted #1 Ethibond suture, reapproximated the gluteus medius and minimus back to the greater trochanter with an oversew of these and #1 Ethibond suture.  I closed the iliotibial band with interrupted #1 Vicryl suture followed by 0 Vicryl in the deep tissue, 2-0 Vicryl in the subcutaneous tissue, and staples on the skin.  Xeroform and an Aquacel dressing was applied.  She was turned back into the supine position, awakened, extubated, and taken to recovery room in guarded condition.     Vanita Pandahristopher Y. Magnus IvanBlackman, M.D.     CYB/MEDQ  D:  05/24/2013  T:  05/25/2013  Job:  [id number]

## 2013-05-25 NOTE — Evaluation (Signed)
Physical Therapy Evaluation Patient Details Name: [name] [name] MRN: [id number] DOB: 11/14/1926 Today's Date: 05/25/2013   History of Present Illness  Pt. is a resident of [facility] where she fell and sustained  a right hip displaced neck fx.  She underwent  a hemiarthroplasty on 05/24/13.    Clinical Impression  Pt. Presents to PT with a decrease in her usual level of one assist for transfers (per son) at the SNF due to her hip fx/ hemiarthroplasty.  She will benefit from acute PT to address her mobility and below issues to [facility]  WILL FOCUS ON TRANSFERS ONLY AS SHE WAS AT TRANSFERS LEVEL ONLY PTA.    Follow Up Recommendations SNF;Supervision/Assistance - 24 hour;Supervision for mobility/OOB    Equipment Recommendations  None recommended by PT    Recommendations for Other Services       Precautions / Restrictions Precautions Precautions: Other (comment);Posterior Hip ( presumed posterior hip precautions due to hemniarthroplasty) Precaution Comments: no precautions in orders but followed posterior hip precautions due to hemiarthroplasty Restrictions Weight Bearing Restrictions: Yes RLE Weight Bearing: Weight bearing as tolerated      Mobility  Bed Mobility Overal bed mobility: Needs Assistance Bed Mobility: Supine to Sit;Sit to Supine     Supine to sit: Total assist Sit to supine: Total assist   General bed mobility comments: pt. unable to assist with bed mobility due to pain and reluctance; needed total assist of one to move to sitting EOB but could not achieve full erect sitting with assist of one  Transfers Overall transfer level:  (unable to attempt)                  Ambulation/Gait Ambulation/Gait assistance:  (pt. unable)              Stairs            Wheelchair Mobility    Modified Rankin (Stroke Patients Only)       Balance Overall balance assessment: Needs assistance Sitting-balance support: Bilateral upper  extremity supported Sitting balance-Leahy Scale: Zero Sitting balance - Comments: pt. unable to achieve full sitting at edge of bed wven with total assist of one Postural control: Left lateral lean                                   Pertinent Vitals/Pain See vitals tab Pt. Appeared comfortable at rest with an increase in pain with mobility attempt.  Once positioned back in bed, was again comfortably resting.      Home Living Family/patient expects to be discharged to:: Skilled nursing facility                      Prior Function Level of Independence: Needs assistance   Gait / Transfers Assistance Needed: pt. needed assist of one for transfers bed<>w/c at SNF, per son            Hand Dominance        Extremity/Trunk Assessment   Upper Extremity Assessment: Generalized weakness           Lower Extremity Assessment: Generalized weakness;RLE deficits/detail RLE Deficits / Details: pain limiting ROM and active motion       Communication   Communication: No difficulties  Cognition Arousal/Alertness: Awake/alert Behavior During Therapy: WFL for tasks assessed/performed Overall Cognitive Status: History of cognitive impairments - at baseline  Memory: Decreased recall of precautions;Decreased short-term memory              General Comments      Exercises General Exercises - Lower Extremity Ankle Circles/Pumps: AROM;Both;5 reps      Assessment/Plan    PT Assessment Patient needs continued PT services  PT Diagnosis Difficulty walking;Acute pain;Other (comment) (difficulty transferring)   PT Problem List Decreased strength;Decreased activity tolerance;Decreased balance;Decreased mobility;Decreased knowledge of use of DME;Decreased safety awareness;Decreased knowledge of precautions;Pain;Decreased cognition  PT Treatment Interventions DME instruction;Functional mobility training;Therapeutic activities;Therapeutic exercise;Balance  training;Patient/family education   PT Goals (Current goals can be found in the Care Plan section) Acute Rehab PT Goals Patient Stated Goal: pt. did not state, son wants pt. to DC back to SNF PT Goal Formulation: With patient/family Time For Goal Achievement: 06/01/13 Potential to Achieve Goals: Fair    Frequency Min 3X/week   Barriers to discharge        Co-evaluation               End of Session   Activity Tolerance: Patient limited by pain Patient left: in bed;with call bell/phone within reach;with bed alarm set Nurse Communication: Mobility status;Precautions;Weight bearing status (presumed posterior total hip precautions)         Time: [id number] PT Time Calculation (min): 23 min   Charges:   PT Evaluation $Initial PT Evaluation Tier I: 1 Procedure PT Treatments $Therapeutic Activity: 8-22 mins   PT G CodesFerman [name]:          [name] [name] 05/25/2013, 10:30 AM  [name] PickingSusan Sameka [name] PT Acute Rehab Services [phone] Beeper [phone]

## 2013-05-25 NOTE — Progress Notes (Signed)
Subjective: 1 Day Post-Op Procedure(s) (LRB): RIGHT HIP HEMIARTHROPLASTY (Right) Patient reports pain as mild.    Objective: Vital signs in last 24 hours: Temp:  [97.6 F (36.4 C)-98.9 F (37.2 C)] 98.6 F (37 C) (04/25 0531) Pulse Rate:  [29-121] 121 (04/25 0601) Resp:  [13-20] 18 (04/25 0531) BP: (127-150)/(60-99) 130/82 mmHg (04/25 0601) SpO2:  [93 %-98 %] 95 % (04/25 0531) FiO2 (%):  [36 %] 36 % (04/24 2033)  Intake/Output from previous day: 04/24 0701 - 04/25 0700 In: 1981.3 [I.V.:1931.3; IV Piggyback:50] Out: 705 [Urine:705] Intake/Output this shift: Total I/O In: 1000 [I.V.:1000] Out: -    Recent Labs  05/22/13 1857 05/23/13 0640 05/24/13 0752  HGB 12.3 12.3 11.4*    Recent Labs  05/23/13 0640 05/24/13 0752  WBC 8.4 7.9  RBC 4.45 4.21  HCT 38.0 35.7*  PLT 270 262    Recent Labs  05/23/13 0640 05/24/13 0752  NA 141 142  K 3.5* 3.5*  CL 103 105  CO2 25 24  BUN 15 14  CREATININE 0.58 0.57  GLUCOSE 119* 106*  CALCIUM 8.3* 8.4   No results found for this basename: LABPT, INR,  in the last 72 hours  bloody drainage thru dressing and had to be changed.   Assessment/Plan: 1 Day Post-Op Procedure(s) (LRB): RIGHT HIP HEMIARTHROPLASTY (Right) Up with therapy , OOB to recliner.  Hgb stable.   [name] [name] 05/25/2013, 6:57 AM

## 2013-05-25 NOTE — Progress Notes (Signed)
PATIENT DETAILS Name: [name] [name] Age: 79 y.o. Sex: female Date of Birth: 04-07-1926 Admit Date: 05/22/2013 Admitting Physician Dorothea Ogle, MD PNT:[id number], Randon Goldsmith, MD  Subjective: Pleasantly confused, no major events overnight.S/P right hemiarthroplasty on 4/24  Assessment/Plan: Principal Problem:  Right  Hip fracture - Ankle fall, patient admitted, orthopedics consulted. After a prolonged discussion with family, patient underwent a right hemiarthroplasty on 4/24. - Doing well postoperatively, apparently had some mild bleeding at the operative site per orthopedic note today, will continue on aspirin for DVT prophylaxis, if no further bleeding, could potentially start heparin or Lovenox prophylactically.  Active Problems: Fracture of the right pubis  - Stable, nonoperative. Orthopedic following.  A. fib with RVR - Rate much better controlled with IV Lopressor, since oral intake resumed, will resume oral metoprolol and Cardizem - Not anticoagulation candidate, continue aspirin.    UTI (lower urinary tract infection) - Urine cultures negative, will stop Rocephin.    COPD (chronic obstructive pulmonary disease) - Lungs are clear, continue with incentive spirometry and nebulized bronchodilators.    Dementia - With mild delirium - Resume Namenda  Stage II decubitus ulcer  - Obtain wound care consult. Likely present prior to admission.  Chronic respiratory failure  - On 3 L of oxygen at home, this is likely secondary to COPD.  Disposition: Remain inpatient  DVT Prophylaxis:  SCD's   Code Status:  DNR  Family Communication Son at bedside  Procedures:  None  CONSULTS:  orthopedic surgery  Time spent 40 minutes-which includes 50% of the time with face-to-face with patient/ family and coordinating care related to the above assessment and plan.  MEDICATIONS: Scheduled Meds: . antiseptic oral rinse  15 mL Mouth Rinse BID  . arformoterol  15 mcg  Nebulization BID  . aspirin EC  325 mg Oral Q breakfast  . cefTRIAXone (ROCEPHIN)  IV  1 g Intravenous Q24H  . Chlorhexidine Gluconate Cloth  6 each Topical Q0600  . docusate sodium  100 mg Oral BID  . famotidine  20 mg Oral Daily  . ferrous sulfate  325 mg Oral Q breakfast  . fluticasone  2 spray Each Nare Daily  . ipratropium-albuterol  3 mL Nebulization TID  . metoprolol  5 mg Intravenous 4 times per day  . mupirocin ointment  1 application Nasal BID  . PARoxetine  10 mg Oral Daily   Continuous Infusions: . sodium chloride 75 mL/hr at 05/25/13 1224   PRN Meds:.acetaminophen, acetaminophen, albuterol, bisacodyl, HYDROcodone-acetaminophen, HYDROcodone-acetaminophen, magnesium hydroxide, menthol-cetylpyridinium, methocarbamol (ROBAXIN) IV, methocarbamol, metoCLOPramide (REGLAN) injection, metoCLOPramide, metoprolol, morphine injection, ondansetron (ZOFRAN) IV, ondansetron, phenol  Antibiotics: Anti-infectives   Start     Dose/Rate Route Frequency Ordered Stop   05/24/13 2300  ceFAZolin (ANCEF) IVPB 2 g/50 mL premix     2 g 100 mL/hr over 30 Minutes Intravenous Every 6 hours 05/24/13 2032 05/25/13 0502   05/24/13 1657  ceFAZolin (ANCEF) 2-3 GM-% IVPB SOLR    Comments:  Mirarchi, Angela   : cabinet override      05/24/13 1657 05/24/13 1702   05/23/13 1300  cefTRIAXone (ROCEPHIN) 1 g in dextrose 5 % 50 mL IVPB     1 g 100 mL/hr over 30 Minutes Intravenous Every 24 hours 05/23/13 1228         PHYSICAL EXAM: Vital signs in last 24 hours: Filed Vitals:   05/25/13 0400 05/25/13 0531 05/25/13 0601 05/25/13 1130  BP:  127/83 130/82 144/78  Pulse:  111 121 82  Temp:  98.6 F (37 C)    TempSrc:  Oral    Resp: 18 18    Height:      Weight:      SpO2: 96% 95%      Weight change:  Filed Weights   05/22/13 2255 05/23/13 0520 05/24/13 0500  Weight: 74.1 kg (163 lb 5.8 oz) 74.1 kg (163 lb 5.8 oz) 74 kg (163 lb 2.3 oz)   Body mass index is 25.55 kg/(m^2).   Gen Exam: Awake and  pleasantly confused Neck: Supple, No JVD.   Chest: B/L Clear-except few scattered rhonchi  CVS: S1 S2 Regular, no murmurs.  Abdomen: soft, BS +, non tender, non distended.  Extremities: no edema, lower extremities warm to touch. Neurologic: Non Focal.   Skin: No Rash.  Wounds: N/A.    Intake/Output from previous day:  Intake/Output Summary (Last 24 hours) at 05/25/13 1341 Last data filed at 05/25/13 0935  Gross per 24 hour  Intake 1981.25 ml  Output    555 ml  Net 1426.25 ml     LAB RESULTS: CBC  Recent Labs Lab 05/22/13 1857 05/23/13 0640 05/24/13 0752 05/25/13 0650  WBC 10.1 8.4 7.9 8.4  HGB 12.3 12.3 11.4* 10.7*  HCT 38.3 38.0 35.7* 33.8*  PLT 285 270 262 267  MCV 84.9 85.4 84.8 84.9  MCH 27.3 27.6 27.1 26.9  MCHC 32.1 32.4 31.9 31.7  RDW 13.9 14.0 13.8 13.6  LYMPHSABS 1.8  --   --   --   MONOABS 0.9  --   --   --   EOSABS 0.5  --   --   --   BASOSABS 0.0  --   --   --     Chemistries   Recent Labs Lab 05/22/13 1857 05/23/13 0640 05/24/13 0752 05/25/13 0650  NA 140 141 142 143  K 3.6* 3.5* 3.5* 3.6*  CL 100 103 105 106  CO2 26 25 24 24   GLUCOSE 152* 119* 106* 119*  BUN 16 15 14 11   CREATININE 0.72 0.58 0.57 0.50  CALCIUM 8.4 8.3* 8.4 8.2*    CBG: No results found for this basename: GLUCAP,  in the last 168 hours  GFR Estimated Creatinine Clearance: 55 ml/min (by C-G formula based on Cr of 0.5).  Coagulation profile No results found for this basename: INR, PROTIME,  in the last 168 hours  Cardiac Enzymes No results found for this basename: CK, CKMB, TROPONINI, MYOGLOBIN,  in the last 168 hours  No components found with this basename: POCBNP,  No results found for this basename: DDIMER,  in the last 72 hours No results found for this basename: HGBA1C,  in the last 72 hours No results found for this basename: CHOL, HDL, LDLCALC, TRIG, CHOLHDL, LDLDIRECT,  in the last 72 hours No results found for this basename: TSH, T4TOTAL, FREET3,  T3FREE, THYROIDAB,  in the last 72 hours No results found for this basename: VITAMINB12, FOLATE, FERRITIN, TIBC, IRON, RETICCTPCT,  in the last 72 hours No results found for this basename: LIPASE, AMYLASE,  in the last 72 hours  Urine Studies No results found for this basename: UACOL, UAPR, USPG, UPH, UTP, UGL, UKET, UBIL, UHGB, UNIT, UROB, ULEU, UEPI, UWBC, URBC, UBAC, CAST, CRYS, UCOM, BILUA,  in the last 72 hours  MICROBIOLOGY: Recent Results (from the past 240 hour(s))  URINE CULTURE     Status: None   Collection Time    05/22/13  9:32 PM      Result Value Ref Range Status   Specimen Description URINE, RANDOM   Final   Special Requests NONE   Final   Culture  Setup Time     Final   Value: 05/23/2013 15:49     Performed at [facility] Count     Final   Value: NO GROWTH     Performed at [facility]   Culture     Final   Value: NO GROWTH     Performed at [facility]   Report Status 05/25/2013 FINAL   Final  MRSA PCR SCREENING     Status: Abnormal   Collection Time    05/23/13 12:09 AM      Result Value Ref Range Status   MRSA by PCR POSITIVE (*) NEGATIVE Final   Comment:            The GeneXpert MRSA Assay (FDA     approved for NASAL specimens     only), is one component of a     comprehensive MRSA colonization     surveillance program. It is not     intended to diagnose MRSA     infection nor to guide or     monitor treatment for     MRSA infections.     RESULT CALLED TO, READ BACK BY AND VERIFIED WITH:     [facility] RN 1610 05/23/13 E.GADDY  SURGICAL PCR SCREEN     Status: Abnormal   Collection Time    05/23/13  8:54 PM      Result Value Ref Range Status   MRSA, PCR POSITIVE (*) NEGATIVE Final   Staphylococcus aureus POSITIVE (*) NEGATIVE Final   Comment:            The Xpert SA Assay (FDA     approved for NASAL specimens     in patients over 48 years of age),     is one component of     a comprehensive surveillance      program.  Test performance has     been validated by [employer] for patients greater     than or equal to 35 year old.     It is not intended     to diagnose infection nor to     guide or monitor treatment.    RADIOLOGY STUDIES/RESULTS: Dg Chest 1 View  05/22/2013   CLINICAL DATA:  Chest pain  EXAM: CHEST - 1 VIEW  COMPARISON:  06/11/2011  FINDINGS: Cardiac shadow is stable. Patchy atelectatic changes are noted in the bases bilaterally. No focal confluent infiltrate is seen. Old left clavicular and rib fractures are noted. No acute bony abnormality is seen.  IMPRESSION: Bibasilar atelectatic changes as described.   Electronically Signed   By: Alcide Clever M.D.   On: 05/22/2013 20:47   Dg Hip Complete Right  05/22/2013   CLINICAL DATA:  Pain post fall  EXAM: RIGHT HIP - COMPLETE 2+ VIEW  COMPARISON:  06/07/2011 and earlier studies  FINDINGS: There is a basicervical fracture of the right femoral neck, with approximately 2.4 cm of displacement, mild impaction of the fracture fragments. Additionally there are minimally displaced superior and inferior right pubic rami fractures, new since previous study of 06/07/2011. Left femoral head prosthesis is partially visualized. Spondylitic changes the lower lumbar spine. Pelvic vascular calcifications bilaterally.  IMPRESSION: 1. Displaced basicervical right femoral neck fracture. 2.  Superior and inferior right pubic rami fractures.   Electronically Signed   By: Oley Balmaniel  Hassell M.D.   On: 05/22/2013 20:51   Dg Pelvis Portable  05/24/2013   CLINICAL DATA:  Postop right hip.  EXAM: PORTABLE PELVIS 1-2 VIEWS  COMPARISON:  05/22/2013  FINDINGS: Single portable AP radiograph of the pelvis is provided. Sequelae of interval right hip hemiarthroplasty are identified. The prosthetic femoral component appears well seated and approximated with the native acetabulum on this single projection. Prior left hip arthroplasty is again seen and grossly unremarkable. Mildly  displaced right superior and inferior pubic rami fractures are grossly unchanged. Postoperative soft tissue emphysema is noted lateral to the right hip.  IMPRESSION: 1. Interval right hip arthroplasty without radiographic evidence of hardware complication. 2. Unchanged, mildly displaced right superior and inferior pubic rami fractures.   Electronically Signed   By: Sebastian AcheAllen  Grady   On: 05/24/2013 20:15    Shanker Levora DredgeM Ghimire, MD  [facility] Pager:[phone]  If 7PM-7AM, please contact night-coverage [email or web address] Password [email or web address] 05/25/2013, 1:41 PM   LOS: 3 days

## 2013-05-26 LAB — BASIC METABOLIC PANEL
BUN: 9 mg/dL (ref 6–23)
CHLORIDE: 100 meq/L (ref 96–112)
CO2: 26 meq/L (ref 19–32)
Calcium: 8.3 mg/dL — ABNORMAL LOW (ref 8.4–10.5)
Creatinine, Ser: 0.5 mg/dL (ref 0.50–1.10)
GFR calc Af Amer: >90 mL/min (ref >90)
GFR calc non Af Amer: 86 mL/min — ABNORMAL LOW (ref >90)
Glucose, Bld: 104 mg/dL — ABNORMAL HIGH (ref 70–99)
POTASSIUM: 3 meq/L — AB (ref 3.7–5.3)
Sodium: 138 mEq/L (ref 137–147)

## 2013-05-26 LAB — CBC
HCT: 32 % — ABNORMAL LOW (ref 36.0–46.0)
HEMOGLOBIN: 10.3 g/dL — AB (ref 12.0–15.0)
MCH: 27.2 pg (ref 26.0–34.0)
MCHC: 32.2 g/dL (ref 30.0–36.0)
MCV: 84.7 fL (ref 78.0–100.0)
PLATELETS: 257 10*3/uL (ref 150–400)
RBC: 3.78 MIL/uL — ABNORMAL LOW (ref 3.87–5.11)
RDW: 13.7 % (ref 11.5–15.5)
WBC: 9.4 10*3/uL (ref 4.0–10.5)

## 2013-05-26 MED ORDER — POTASSIUM CHLORIDE CRYS ER 20 MEQ PO TBCR
40.0000 meq | EXTENDED_RELEASE_TABLET | Freq: Once | ORAL | Status: AC
  Administered 2013-05-26: 40 meq via ORAL
  Filled 2013-05-26: qty 2

## 2013-05-26 NOTE — Progress Notes (Signed)
Subjective: Patient stable, pain controlled,   Objective: Vital signs in last 24 hours: Temp:  [98.4 F (36.9 C)-99.6 F (37.6 C)] 98.6 F (37 C) (04/26 0519) Pulse Rate:  [69-88] 77 (04/26 0519) Resp:  [15-18] 17 (04/26 0519) BP: (99-150)/(61-81) 138/78 mmHg (04/26 0519) SpO2:  [92 %-97 %] 97 % (04/26 0829)  Intake/Output from previous day: 04/25 0701 - 04/26 0700 In: 470 [I.V.:470] Out: 150 [Urine:150] Intake/Output this shift:    Exam:  Dorsiflexion/Plantar flexion intact Leg lengths equal with no pain with hip range of motion  Labs:  Recent Labs  05/24/13 0752 05/25/13 0650 05/26/13 0638  HGB 11.4* 10.7* 10.3*    Recent Labs  05/25/13 0650 05/26/13 0638  WBC 8.4 9.4  RBC 3.98 3.78*  HCT 33.8* 32.0*  PLT 267 257    Recent Labs  05/25/13 0650 05/26/13 0638  NA 143 138  K 3.6* 3.0*  CL 106 100  CO2 24 26  BUN 11 9  CREATININE 0.50 0.50  GLUCOSE 119* 104*  CALCIUM 8.2* 8.3*   No results found for this basename: LABPT, INR,  in the last 72 hours  Assessment/Plan: Doing well from ortho standpoint, placement pending   Cammy CopaGregory Scott Dean 05/26/2013, 9:04 AM

## 2013-05-26 NOTE — Progress Notes (Signed)
PATIENT DETAILS Name: [name] [name] Age: 80 y.o. Sex: female DaLajoyce Cornerste of Birth: 02/08/1926 Admit Date: 05/22/2013 Admitting Physician Dorothea OgleIskra M Myers, MD ZOX:[id number]:ALEXANDER, Randon GoldsmithANNE D, MD  Subjective: Pleasantly confused, no major events overnight.S/P right hemiarthroplasty on 4/24.   Assessment/Plan: Principal Problem:  Right  Hip fracture - Ankle fall, patient admitted, orthopedics consulted. After a prolonged discussion with family, patient underwent a right hemiarthroplasty on 4/24. - Doing well postoperatively, apparently had some mild bleeding at the operative site per orthopedic note today, will continue on aspirin for DVT prophylaxis, if no further bleeding, could potentially start heparin or Lovenox prophylactically. -should be ready for SNF 4/27  Active Problems: Fracture of the right pubis  - Stable, nonoperative. Orthopedic following.  A. fib with RVR - Required Rate control with IV Lopressor, since oral intake resumed,  resumed oral metoprolol and Cardizem - Not anticoagulation candidate, continue aspirin.    UTI (lower urinary tract infection) - Urine cultures negative, will stop Rocephin.    COPD (chronic obstructive pulmonary disease) - Lungs are clear, continue with incentive spirometry and nebulized bronchodilators.    Dementia - With mild delirium - Resume Namenda  Sacrum/gluteal fold with partial thickness fissure -  Likely present prior to admission.Appreciate wound care eval,per wound care not a pressure ulcer.  Chronic respiratory failure  - On 3 L of oxygen at home, this is likely secondary to COPD.  Disposition: Remain inpatient  DVT Prophylaxis:  SCD's   Code Status:  DNR  Family Communication Son at bedside  Procedures:  None  CONSULTS:  orthopedic surgery   MEDICATIONS: Scheduled Meds: . antiseptic oral rinse  15 mL Mouth Rinse BID  . arformoterol  15 mcg Nebulization BID  . aspirin EC  325 mg Oral Q breakfast  . Chlorhexidine  Gluconate Cloth  6 each Topical Q0600  . diltiazem  180 mg Oral Daily  . docusate sodium  100 mg Oral BID  . famotidine  20 mg Oral Daily  . ferrous sulfate  325 mg Oral Q breakfast  . fluticasone  2 spray Each Nare Daily  . ipratropium-albuterol  3 mL Nebulization TID  . memantine  10 mg Oral BID  . metoprolol tartrate  25 mg Oral BID  . mupirocin ointment  1 application Nasal BID  . PARoxetine  10 mg Oral Daily  . potassium chloride  40 mEq Oral Once  . senna  2 tablet Oral QHS   Continuous Infusions: . sodium chloride 10 mL/hr (05/25/13 1512)   PRN Meds:.acetaminophen, acetaminophen, albuterol, bisacodyl, HYDROcodone-acetaminophen, HYDROcodone-acetaminophen, magnesium hydroxide, menthol-cetylpyridinium, methocarbamol (ROBAXIN) IV, methocarbamol, metoCLOPramide (REGLAN) injection, metoCLOPramide, metoprolol, morphine injection, ondansetron (ZOFRAN) IV, ondansetron, phenol  Antibiotics: Anti-infectives   Start     Dose/Rate Route Frequency Ordered Stop   05/24/13 2300  ceFAZolin (ANCEF) IVPB 2 g/50 mL premix     2 g 100 mL/hr over 30 Minutes Intravenous Every 6 hours 05/24/13 2032 05/25/13 0502   05/24/13 1657  ceFAZolin (ANCEF) 2-3 GM-% IVPB SOLR    Comments:  Mirarchi, Angela   : cabinet override      05/24/13 1657 05/24/13 1702   05/23/13 1300  cefTRIAXone (ROCEPHIN) 1 g in dextrose 5 % 50 mL IVPB  Status:  Discontinued     1 g 100 mL/hr over 30 Minutes Intravenous Every 24 hours 05/23/13 1228 05/25/13 1348       PHYSICAL EXAM: Vital signs in last 24 hours: Filed Vitals:   05/26/13 0000  05/26/13 0400 05/26/13 0519 05/26/13 0829  BP:   138/78   Pulse:   77   Temp:   98.6 F (37 C)   TempSrc:   Oral   Resp: 17 15 17    Height:      Weight:      SpO2: 95% 94% 96% 97%    Weight change:  Filed Weights   05/22/13 2255 05/23/13 0520 05/24/13 0500  Weight: 74.1 kg (163 lb 5.8 oz) 74.1 kg (163 lb 5.8 oz) 74 kg (163 lb 2.3 oz)   Body mass index is 25.55 kg/(m^2).    Gen Exam: Awake and pleasantly confused Neck: Supple, No JVD.   Chest: B/L Clear-except few scattered rhonchi  CVS: S1 S2 Regular, no murmurs.  Abdomen: soft, BS +, non tender, non distended.  Extremities: no edema, lower extremities warm to touch. Neurologic: Non Focal.   Skin: No Rash.  Wounds: N/A.    Intake/Output from previous day:  Intake/Output Summary (Last 24 hours) at 05/26/13 1029 Last data filed at 05/25/13 1700  Gross per 24 hour  Intake    470 ml  Output      0 ml  Net    470 ml     LAB RESULTS: CBC  Recent Labs Lab 05/22/13 1857 05/23/13 0640 05/24/13 0752 05/25/13 0650 05/26/13 0638  WBC 10.1 8.4 7.9 8.4 9.4  HGB 12.3 12.3 11.4* 10.7* 10.3*  HCT 38.3 38.0 35.7* 33.8* 32.0*  PLT 285 270 262 267 257  MCV 84.9 85.4 84.8 84.9 84.7  MCH 27.3 27.6 27.1 26.9 27.2  MCHC 32.1 32.4 31.9 31.7 32.2  RDW 13.9 14.0 13.8 13.6 13.7  LYMPHSABS 1.8  --   --   --   --   MONOABS 0.9  --   --   --   --   EOSABS 0.5  --   --   --   --   BASOSABS 0.0  --   --   --   --     Chemistries   Recent Labs Lab 05/22/13 1857 05/23/13 0640 05/24/13 0752 05/25/13 0650 05/26/13 0638  NA 140 141 142 143 138  K 3.6* 3.5* 3.5* 3.6* 3.0*  CL 100 103 105 106 100  CO2 26 25 24 24 26   GLUCOSE 152* 119* 106* 119* 104*  BUN 16 15 14 11 9   CREATININE 0.72 0.58 0.57 0.50 0.50  CALCIUM 8.4 8.3* 8.4 8.2* 8.3*    CBG: No results found for this basename: GLUCAP,  in the last 168 hours  GFR Estimated Creatinine Clearance: 55 ml/min (by C-G formula based on Cr of 0.5).  Coagulation profile No results found for this basename: INR, PROTIME,  in the last 168 hours  Cardiac Enzymes No results found for this basename: CK, CKMB, TROPONINI, MYOGLOBIN,  in the last 168 hours  No components found with this basename: POCBNP,  No results found for this basename: DDIMER,  in the last 72 hours No results found for this basename: HGBA1C,  in the last 72 hours No results found for  this basename: CHOL, HDL, LDLCALC, TRIG, CHOLHDL, LDLDIRECT,  in the last 72 hours No results found for this basename: TSH, T4TOTAL, FREET3, T3FREE, THYROIDAB,  in the last 72 hours No results found for this basename: VITAMINB12, FOLATE, FERRITIN, TIBC, IRON, RETICCTPCT,  in the last 72 hours No results found for this basename: LIPASE, AMYLASE,  in the last 72 hours  Urine Studies No results found for this basename:  UACOL, UAPR, USPG, UPH, UTP, UGL, UKET, UBIL, UHGB, UNIT, UROB, ULEU, UEPI, UWBC, URBC, UBAC, CAST, CRYS, UCOM, BILUA,  in the last 72 hours  MICROBIOLOGY: Recent Results (from the past 240 hour(s))  URINE CULTURE     Status: None   Collection Time    05/22/13  9:32 PM      Result Value Ref Range Status   Specimen Description URINE, RANDOM   Final   Special Requests NONE   Final   Culture  Setup Time     Final   Value: 05/23/2013 15:49     Performed at [facility] Count     Final   Value: NO GROWTH     Performed at [facility]   Culture     Final   Value: NO GROWTH     Performed at [facility]   Report Status 05/25/2013 FINAL   Final  MRSA PCR SCREENING     Status: Abnormal   Collection Time    05/23/13 12:09 AM      Result Value Ref Range Status   MRSA by PCR POSITIVE (*) NEGATIVE Final   Comment:            The GeneXpert MRSA Assay (FDA     approved for NASAL specimens     only), is one component of a     comprehensive MRSA colonization     surveillance program. It is not     intended to diagnose MRSA     infection nor to guide or     monitor treatment for     MRSA infections.     RESULT CALLED TO, READ BACK BY AND VERIFIED WITH:     [facility] RN [id number] 05/23/13 E.GADDY  SURGICAL PCR SCREEN     Status: Abnormal   Collection Time    05/23/13  8:54 PM      Result Value Ref Range Status   MRSA, PCR POSITIVE (*) NEGATIVE Final   Staphylococcus aureus POSITIVE (*) NEGATIVE Final   Comment:            The Xpert SA Assay (FDA      approved for NASAL specimens     in patients over 90 years of age),     is one component of     a comprehensive surveillance     program.  Test performance has     been validated by [facility] [employer] for patients greater     than or equal to 78 year old.     It is not intended     to diagnose infection nor to     guide or monitor treatment.    RADIOLOGY STUDIES/RESULTS: Dg Chest 1 View  05/22/2013   CLINICAL DATA:  Chest pain  EXAM: CHEST - 1 VIEW  COMPARISON:  06/11/2011  FINDINGS: Cardiac shadow is stable. Patchy atelectatic changes are noted in the bases bilaterally. No focal confluent infiltrate is seen. Old left clavicular and rib fractures are noted. No acute bony abnormality is seen.  IMPRESSION: Bibasilar atelectatic changes as described.   Electronically Signed   By: Alcide Clever M.D.   On: 05/22/2013 20:47   Dg Hip Complete Right  05/22/2013   CLINICAL DATA:  Pain post fall  EXAM: RIGHT HIP - COMPLETE 2+ VIEW  COMPARISON:  06/07/2011 and earlier studies  FINDINGS: There is a basicervical fracture of the right femoral neck, with approximately 2.4 cm  of displacement, mild impaction of the fracture fragments. Additionally there are minimally displaced superior and inferior right pubic rami fractures, new since previous study of 06/07/2011. Left femoral head prosthesis is partially visualized. Spondylitic changes the lower lumbar spine. Pelvic vascular calcifications bilaterally.  IMPRESSION: 1. Displaced basicervical right femoral neck fracture. 2. Superior and inferior right pubic rami fractures.   Electronically Signed   By: Oley Balm M.D.   On: 05/22/2013 20:51   Dg Pelvis Portable  05/24/2013   CLINICAL DATA:  Postop right hip.  EXAM: PORTABLE PELVIS 1-2 VIEWS  COMPARISON:  05/22/2013  FINDINGS: Single portable AP radiograph of the pelvis is provided. Sequelae of interval right hip hemiarthroplasty are identified. The prosthetic femoral component appears well seated and  approximated with the native acetabulum on this single projection. Prior left hip arthroplasty is again seen and grossly unremarkable. Mildly displaced right superior and inferior pubic rami fractures are grossly unchanged. Postoperative soft tissue emphysema is noted lateral to the right hip.  IMPRESSION: 1. Interval right hip arthroplasty without radiographic evidence of hardware complication. 2. Unchanged, mildly displaced right superior and inferior pubic rami fractures.   Electronically Signed   By: Sebastian Ache   On: 05/24/2013 20:15    Kiante Petrovich Levora Dredge, MD  [facility] Pager:[phone]  If 7PM-7AM, please contact night-coverage [email or web address] Password [email or web address] 05/26/2013, 10:29 AM   LOS: 4 days

## 2013-05-26 NOTE — Clinical Social Work Psychosocial (Signed)
Clinical Social Work Department BRIEF PSYCHOSOCIAL ASSESSMENT 05/26/2013  Patient:  [name] [name],[name] [name]     Account Number:  [id number]     Admit date:  05/22/2013  Clinical Social Worker:  Earnestine LeysPatrick-jefferson,Amun Stemm, LCSWA  Date/Time:  05/26/2013 07:10 PM  Referred by:  Physician  Date Referred:  05/26/2013 Referred for  SNF Placement   Other Referral:   Interview type:  Family Other interview type:   CSW spoke with patient's son [name] Noa(William) via phone.    PSYCHOSOCIAL DATA Living Status:  FACILITY [facility] Level of care:  Skilled Nursing Facility Primary support name:  [name] NoaWilliam Primary support relationship to patient:  CHILD, ADULT Degree of support available:   Good. Patient's son is active with patient's d/c plan.    CURRENT CONCERNS Current Concerns  Post-Acute Placement   Other Concerns:    SOCIAL WORK ASSESSMENT / PLAN CSW contacted patient's son [name] Noa(William) [phone] and introduced self. CSW explained role and discussed d/c plan. Patient's son is agreeable to [facility]. [facility] made facility aware of patient's return on 05/27/13.   Assessment/plan status:  No Further Intervention Required Other assessment/ plan:   CSW will update FL2 for return to SNF.   Information/referral to community resources:    PATIENT'S/FAMILY'S RESPONSE TO PLAN OF CARE: Patient's son thanked CSW for assistance with d/c plan and providing a listening ear.    [name] [name], LCSWA Weekend Clinical Social Worker [phone]

## 2013-05-26 NOTE — Progress Notes (Signed)
Notified Claiborne Billingsallahan, NP that pt's HR is 55-60 on tele. NP stated to hold tonight's dose of Metoprolol 25 mg po. Will continue to monitor pt. Nelda MarseilleJenny Thacker, RN

## 2013-05-26 NOTE — Progress Notes (Signed)
OT Cancellation Note  Patient Details Name: [name] CornersRoberta J Litsey MRN: [id number] DOB: 01/08/1927   Cancelled Treatment:    Reason Eval/Treat Not Completed: OT screened. Pt from SNF and current D/C plan is SNF. No apparent immediate acute care OT needs, therefore will defer OT to SNF. If OT eval is needed please call Acute Rehab Dept. at [phone] or text page OT at [phone].    Earlie RavelingLindsey L Aowyn Rozeboom OTR/L [phone] 05/26/2013, 4:21 PM

## 2013-05-27 ENCOUNTER — Non-Acute Institutional Stay (SKILLED_NURSING_FACILITY): Payer: Medicare Other | Admitting: Internal Medicine

## 2013-05-27 ENCOUNTER — Encounter: Payer: Self-pay | Admitting: Internal Medicine

## 2013-05-27 DIAGNOSIS — L89109 Pressure ulcer of unspecified part of back, unspecified stage: Secondary | ICD-10-CM

## 2013-05-27 DIAGNOSIS — L8992 Pressure ulcer of unspecified site, stage 2: Secondary | ICD-10-CM

## 2013-05-27 DIAGNOSIS — L89152 Pressure ulcer of sacral region, stage 2: Secondary | ICD-10-CM | POA: Insufficient documentation

## 2013-05-27 DIAGNOSIS — I4891 Unspecified atrial fibrillation: Secondary | ICD-10-CM

## 2013-05-27 DIAGNOSIS — S72009A Fracture of unspecified part of neck of unspecified femur, initial encounter for closed fracture: Secondary | ICD-10-CM

## 2013-05-27 DIAGNOSIS — D649 Anemia, unspecified: Secondary | ICD-10-CM

## 2013-05-27 DIAGNOSIS — J449 Chronic obstructive pulmonary disease, unspecified: Secondary | ICD-10-CM

## 2013-05-27 DIAGNOSIS — F0390 Unspecified dementia without behavioral disturbance: Secondary | ICD-10-CM

## 2013-05-27 DIAGNOSIS — J9610 Chronic respiratory failure, unspecified whether with hypoxia or hypercapnia: Secondary | ICD-10-CM | POA: Insufficient documentation

## 2013-05-27 LAB — CBC
HEMATOCRIT: 30.4 % — AB (ref 36.0–46.0)
Hemoglobin: 9.7 g/dL — ABNORMAL LOW (ref 12.0–15.0)
MCH: 26.8 pg (ref 26.0–34.0)
MCHC: 31.9 g/dL (ref 30.0–36.0)
MCV: 84 fL (ref 78.0–100.0)
Platelets: 255 10*3/uL (ref 150–400)
RBC: 3.62 MIL/uL — ABNORMAL LOW (ref 3.87–5.11)
RDW: 13.7 % (ref 11.5–15.5)
WBC: 9.2 10*3/uL (ref 4.0–10.5)

## 2013-05-27 LAB — BASIC METABOLIC PANEL
BUN: 12 mg/dL (ref 6–23)
CHLORIDE: 101 meq/L (ref 96–112)
CO2: 28 mEq/L (ref 19–32)
CREATININE: 0.55 mg/dL (ref 0.50–1.10)
Calcium: 8.2 mg/dL — ABNORMAL LOW (ref 8.4–10.5)
GFR calc Af Amer: >90 mL/min (ref >90)
GFR calc non Af Amer: 84 mL/min — ABNORMAL LOW (ref >90)
GLUCOSE: 103 mg/dL — AB (ref 70–99)
POTASSIUM: 3.5 meq/L — AB (ref 3.7–5.3)
Sodium: 139 mEq/L (ref 137–147)

## 2013-05-27 MED ORDER — ASPIRIN 325 MG PO TBEC: 325.0000 mg | DELAYED_RELEASE_TABLET | Freq: Every day | ORAL | Status: DC

## 2013-05-27 MED ORDER — ONDANSETRON HCL 4 MG PO TABS: 4.0000 mg | ORAL_TABLET | Freq: Four times a day (QID) | ORAL | Status: DC | PRN

## 2013-05-27 MED ORDER — HYDROCODONE-ACETAMINOPHEN 5-325 MG PO TABS: 1.0000 | ORAL_TABLET | Freq: Four times a day (QID) | ORAL | Status: DC | PRN

## 2013-05-27 NOTE — Assessment & Plan Note (Signed)
No wheezing , needs O2 3L

## 2013-05-27 NOTE — Assessment & Plan Note (Signed)
Hb 11.4 to 9.7 post-op; will recheck CBC 5/1 as recommended

## 2013-05-27 NOTE — Progress Notes (Addendum)
Nsg Discharge Note  Admit Date:  05/22/2013 Discharge date: 05/27/2013   [name] to be D/C'd Rehab per MD order.  AVS completed.  Copy for chart, and copy for patient signed, and dated. Patient/caregiver able to verbalize understanding.  Discharge Medication:   Medication List         acetaminophen 325 MG tablet  Commonly known as:  TYLENOL  Take 650 mg by mouth every 4 (four) hours as needed for mild pain.     albuterol (2.5 MG/3ML) 0.083% nebulizer solution  Commonly known as:  PROVENTIL  Take 2.5 mg by nebulization every 6 (six) hours as needed for wheezing or shortness of breath.     arformoterol 15 MCG/2ML Nebu  Commonly known as:  BROVANA  Take 15 mcg by nebulization 2 (two) times daily.     aspirin 325 MG EC tablet  Take 1 tablet (325 mg total) by mouth daily with breakfast.     aspirin EC 81 MG tablet  Take 81 mg by mouth daily.     bisacodyl 10 MG suppository  Commonly known as:  DULCOLAX  Place 10 mg rectally as needed for moderate constipation.     diltiazem 180 MG 24 hr capsule  Commonly known as:  DILACOR XR  Take 1 capsule (180 mg total) by mouth daily.     ergocalciferol 50000 UNITS capsule  Commonly known as:  VITAMIN D2  Take 50,000 Units by mouth every 30 (thirty) days.     ferrous sulfate 325 (65 FE) MG tablet  Take 325 mg by mouth daily with breakfast.     fluticasone 50 MCG/ACT nasal spray  Commonly known as:  FLONASE  Place 2 sprays into the nose daily.     HYDROcodone-acetaminophen 5-325 MG per tablet  Commonly known as:  NORCO/VICODIN  Take 1 tablet by mouth every 6 (six) hours as needed for moderate pain.     ipratropium-albuterol 0.5-2.5 (3) MG/3ML Soln  Commonly known as:  DUONEB  Take 3 mLs by nebulization every 4 (four) hours as needed (wheezing).     magnesium hydroxide 400 MG/5ML suspension  Commonly known as:  MILK OF MAGNESIA  Take 30 mLs by mouth daily as needed for mild constipation.     memantine 10 MG tablet   Commonly known as:  NAMENDA  Take 10 mg by mouth 2 (two) times daily.     metoprolol tartrate 25 MG tablet  Commonly known as:  LOPRESSOR  Take 25 mg by mouth 2 (two) times daily.     ondansetron 4 MG tablet  Commonly known as:  ZOFRAN  Take 1 tablet (4 mg total) by mouth every 6 (six) hours as needed for nausea.     PARoxetine 10 MG tablet  Commonly known as:  PAXIL  Take 10 mg by mouth daily.     promethazine 25 MG suppository  Commonly known as:  PHENERGAN  Place 25 mg rectally every 6 (six) hours as needed for nausea or vomiting.     psyllium 58.6 % packet  Commonly known as:  METAMUCIL  Take 1 packet by mouth daily.     ranitidine 150 MG tablet  Commonly known as:  ZANTAC  Take 150 mg by mouth daily as needed. Acid reflux        Discharge Assessment: Filed Vitals:   05/27/13 1523  BP:   Pulse:   Temp:   Resp: 20   Skin clean, dry and intact. Stage II healed. R hip incision  with foam dressings. IV catheter discontinued intact. Site without signs and symptoms of complications - no redness or edema noted at insertion site, patient denies c/o pain - only slight tenderness at site.  Dressing with slight pressure applied.  D/c Instructions-Education: Discharge packet given to transporters for SNF.   Swayzie Choate Elaine Jaaron Oleson, RN 05/27/2013 4:43 PM

## 2013-05-27 NOTE — Discharge Summary (Addendum)
Physician Discharge Summary  [name] [name] ZOX:[id number] DOB: 10/25/1926 DOA: 05/22/2013  PCP: Margit Hanks, MD  Admit date: 05/22/2013 Discharge date: 05/27/2013  Time spent: 45 minutes  Recommendations for Outpatient Follow-up:  1. Discharge to SNF  2. Follow up with Orthopedics to suture removal and wound check. 3. BMET and CBC on 5/1:  Patient has post op anemia and recently was hypokalemic. 4. Wound Care at SNF for sacral decub.   Discharge Diagnoses:  Principal Problem:   Hip fracture Active Problems:   COPD (chronic obstructive pulmonary disease)   Dementia   Atrial fibrillation   Discharge Condition: stable  Diet recommendation: low sodium heart healthy  Filed Weights   05/22/13 2255 05/23/13 0520 05/24/13 0500  Weight: 74.1 kg (163 lb 5.8 oz) 74.1 kg (163 lb 5.8 oz) 74 kg (163 lb 2.3 oz)    History of present illness:    [name] [name] is an 80 y.o. female with HTN, COPD on 3L oxygen, atrial fibrillation, dementia, and a resident of [facility].  She was brought to ED after a fall at the SNF and sustained right hip fracture. Patient is unable to provide history as she has a history of dementia and no family is available at bedside.She fell 2 days prior to admission and xray showed right hip fracture. She was given total of 100 mcg of fentanyl and was noted to be hypotensive on arrival to ED with Systolic BP in 90's.   Hospital Course:   Right hip fracture- displaced femoral neck fracture  Patient sustained RHF at SNF two days prior to admission  Patient received a right hip hemiarthroplasty for right hip displaced femoral neck frature on Friday 05/24/2013  Her Afib medications were on hold due to the hypotension.  Patient developed Afib rvr prior to surgery on 05/24/2012,rate was controlled with lopressor.  She underwent right hip hemiarthroplasty with general anesthesia on 05/25/2013 with Dr. Magnus Ivan and Mr. Chestine Spore PA-C  Patient is hemodynamically stable  and will discharge to SNF for further care  Per Orthopedics continue 325 mg and 81 mg aspirin for 4 weeks to prevent VTE.  Then D/C 325 mg Aspirin and continue 81 mg aspirin.    Dementia  Patient presented senile dementia  Appears at baseline throughout inpatient stay  Patient was kept on home regime (namenda)  Hypotension  Hypotension secondary to the fentanyl   Her diltiazem was held.  Her blood pressure was controlled inpatient with Lopressor  Resolved.  Now able to resume "prior to admission antihypertensives"  Atrail Fibrillation with RVR  Patient has a hx of afib and on Diltiazem prior to admission  Afib became uncontrolled with RVR while holding her antiarrythmic meds  due to hypotension  ECG on 05/24/2013 showed afib rvr  Controlled inpatient with Lopressor  Resume Oral medications and home regimen on discharge, not a candidate for anticoagulation, maintain on ASA  UTI  UA on 05/22/2013 indicated possible infection  Urine culture negative  Treated briefly with rocephin, now discontinued.  Resolved  COPD   Patient stable on oxygen,    Albuterol PRN  Stage II decubitus Ulcer  Likely present on admission  Request wound care at skilled nursing facility.  Chronic respiratory failure   On 3 L of oxygen at home, this is likely secondary to COPD.   Procedures:  Right hip hemiarthroplasty  Consultations:  Orthopedics - Dr. Magnus Ivan.  Discharge Exam: Filed Vitals:   05/27/13 1039  BP: 104/67  Pulse: 74  Temp:  Resp:     Physical Exam  Constitutional: She is oriented to person, She appears well-developed and well-nourished. No distress.  HENT:   Head: Normocephalic and atraumatic.   Eyes: Pupils are equal, round, and reactive to light.   Neck: No JVD present.  Cardiovascular: Normal rate, regular rhythm and normal heart sounds.   Respiratory: Effort normal and breath sounds normal. No stridor. No respiratory distress. She has no  wheezes. She exhibits no tenderness.  GI: Soft. Bowel sounds are normal. She exhibits no distension. There is no tenderness. There is no rebound.  Musculoskeletal: Right hip- ROM was not established due to recent surgury. She exhibits tenderness around operative site.  Neurological: She was lethargic,  Oriented to person and place.  Skin: Skin is warm and dry. She is not diaphoretic. No erythema.  Psychiatric: She has a normal mood and affect. Her speech is normal and behavior is normal. Judgment and thought content normal. Cognition normal,  Memory is impaired.        Discharge Orders   Future Orders Complete By Expires   Diet - low sodium heart healthy  As directed    Increase activity slowly  As directed    Leave dressing on - Keep it clean, dry, and intact until clinic visit  As directed        Medication List    STOP taking these medications       cefTRIAXone 1 G injection  Commonly known as:  ROCEPHIN      TAKE these medications       acetaminophen 325 MG tablet  Commonly known as:  TYLENOL  Take 650 mg by mouth every 4 (four) hours as needed for mild pain.     albuterol (2.5 MG/3ML) 0.083% nebulizer solution  Commonly known as:  PROVENTIL  Take 2.5 mg by nebulization every 6 (six) hours as needed for wheezing or shortness of breath.     arformoterol 15 MCG/2ML Nebu  Commonly known as:  BROVANA  Take 15 mcg by nebulization 2 (two) times daily.     aspirin 325 MG EC tablet  Take 1 tablet (325 mg total) by mouth daily with breakfast.     aspirin EC 81 MG tablet  Take 81 mg by mouth daily.     bisacodyl 10 MG suppository  Commonly known as:  DULCOLAX  Place 10 mg rectally as needed for moderate constipation.     diltiazem 180 MG 24 hr capsule  Commonly known as:  DILACOR XR  Take 1 capsule (180 mg total) by mouth daily.     ergocalciferol 50000 UNITS capsule  Commonly known as:  VITAMIN D2  Take 50,000 Units by mouth every 30 (thirty) days.     ferrous  sulfate 325 (65 FE) MG tablet  Take 325 mg by mouth daily with breakfast.     fluticasone 50 MCG/ACT nasal spray  Commonly known as:  FLONASE  Place 2 sprays into the nose daily.     HYDROcodone-acetaminophen 5-325 MG per tablet  Commonly known as:  NORCO/VICODIN  Take 1 tablet by mouth every 6 (six) hours as needed for moderate pain.     ipratropium-albuterol 0.5-2.5 (3) MG/3ML Soln  Commonly known as:  DUONEB  Take 3 mLs by nebulization every 4 (four) hours as needed (wheezing).     magnesium hydroxide 400 MG/5ML suspension  Commonly known as:  MILK OF MAGNESIA  Take 30 mLs by mouth daily as needed for mild constipation.  memantine 10 MG tablet  Commonly known as:  NAMENDA  Take 10 mg by mouth 2 (two) times daily.     metoprolol tartrate 25 MG tablet  Commonly known as:  LOPRESSOR  Take 25 mg by mouth 2 (two) times daily.     ondansetron 4 MG tablet  Commonly known as:  ZOFRAN  Take 1 tablet (4 mg total) by mouth every 6 (six) hours as needed for nausea.     PARoxetine 10 MG tablet  Commonly known as:  PAXIL  Take 10 mg by mouth daily.     promethazine 25 MG suppository  Commonly known as:  PHENERGAN  Place 25 mg rectally every 6 (six) hours as needed for nausea or vomiting.     psyllium 58.6 % packet  Commonly known as:  METAMUCIL  Take 1 packet by mouth daily.     ranitidine 150 MG tablet  Commonly known as:  ZANTAC  Take 150 mg by mouth daily as needed. Acid reflux       No Known Allergies Follow-up Information   Follow up with Margit HanksALEXANDER, ANNE D, MD. Schedule an appointment as soon as possible for a visit in 1 week. (For wound re-check)    Specialty:  Internal Medicine   Contact information:   [address] [place] [phone]       Follow up with Kathryne HitchBLACKMAN,CHRISTOPHER Y, MD In 2 weeks. (Hip surgery follow up.)    Specialty:  Orthopedic Surgery   Contact information:   [address] [place] [address] [phone]         The results of significant diagnostics from this hospitalization (including imaging, microbiology, ancillary and laboratory) are listed below for reference.    Significant Diagnostic Studies: Dg Chest 1 View  05/22/2013   CLINICAL DATA:  Chest pain  EXAM: CHEST - 1 VIEW  COMPARISON:  06/11/2011  FINDINGS: Cardiac shadow is stable. Patchy atelectatic changes are noted in the bases bilaterally. No focal confluent infiltrate is seen. Old left clavicular and rib fractures are noted. No acute bony abnormality is seen.  IMPRESSION: Bibasilar atelectatic changes as described.   Electronically Signed   By: Alcide CleverMark  Lukens M.D.   On: 05/22/2013 20:47   Dg Hip Complete Right  05/22/2013   CLINICAL DATA:  Pain post fall  EXAM: RIGHT HIP - COMPLETE 2+ VIEW  COMPARISON:  06/07/2011 and earlier studies  FINDINGS: There is a basicervical fracture of the right femoral neck, with approximately 2.4 cm of displacement, mild impaction of the fracture fragments. Additionally there are minimally displaced superior and inferior right pubic rami fractures, new since previous study of 06/07/2011. Left femoral head prosthesis is partially visualized. Spondylitic changes the lower lumbar spine. Pelvic vascular calcifications bilaterally.  IMPRESSION: 1. Displaced basicervical right femoral neck fracture. 2. Superior and inferior right pubic rami fractures.   Electronically Signed   By: Oley Balmaniel  Hassell M.D.   On: 05/22/2013 20:51   Dg Pelvis Portable  05/24/2013   CLINICAL DATA:  Postop right hip.  EXAM: PORTABLE PELVIS 1-2 VIEWS  COMPARISON:  05/22/2013  FINDINGS: Single portable AP radiograph of the pelvis is provided. Sequelae of interval right hip hemiarthroplasty are identified. The prosthetic femoral component appears well seated and approximated with the native acetabulum on this single projection. Prior left hip arthroplasty is again seen and grossly unremarkable. Mildly displaced right superior and inferior pubic rami  fractures are grossly unchanged. Postoperative soft tissue emphysema is noted lateral to the right  hip.  IMPRESSION: 1. Interval right hip arthroplasty without radiographic evidence of hardware complication. 2. Unchanged, mildly displaced right superior and inferior pubic rami fractures.   Electronically Signed   By: Sebastian AcheAllen  Grady   On: 05/24/2013 20:15    Microbiology: Recent Results (from the past 240 hour(s))  URINE CULTURE     Status: None   Collection Time    05/22/13  9:32 PM      Result Value Ref Range Status   Specimen Description URINE, RANDOM   Final   Special Requests NONE   Final   Culture  Setup Time     Final   Value: 05/23/2013 15:49     Performed at [facility]   Colony Count     Final   Value: NO GROWTH     Performed at [facility]   Culture     Final   Value: NO GROWTH     Performed at [facility]   Report Status 05/25/2013 FINAL   Final  MRSA PCR SCREENING     Status: Abnormal   Collection Time    05/23/13 12:09 AM      Result Value Ref Range Status   MRSA by PCR POSITIVE (*) NEGATIVE Final   Comment:            The GeneXpert MRSA Assay (FDA     approved for NASAL specimens     only), is one component of a     comprehensive MRSA colonization     surveillance program. It is not     intended to diagnose MRSA     infection nor to guide or     monitor treatment for     MRSA infections.     RESULT CALLED TO, READ BACK BY AND VERIFIED WITH:     [facility]ANE[facility] RN [phone] 05/23/13 E.GADDY  SURGICAL PCR SCREEN     Status: Abnormal   Collection Time    05/23/13  8:54 PM      Result Value Ref Range Status   MRSA, PCR POSITIVE (*) NEGATIVE Final   Staphylococcus aureus POSITIVE (*) NEGATIVE Final   Comment:            The Xpert SA Assay (FDA     approved for NASAL specimens     in patients over 78 years of age),     is one component of     a comprehensive surveillance     program.  Test performance has     been validated by [facility]  for patients greater     than or equal to 78 year old.     It is not intended     to diagnose infection nor to     guide or monitor treatment.     Labs: Basic Metabolic Panel:  Recent Labs Lab 05/23/13 0640 05/24/13 0752 05/25/13 0650 05/26/13 0638 05/27/13 0540  NA 141 142 143 138 139  K 3.5* 3.5* 3.6* 3.0* 3.5*  CL 103 105 106 100 101  CO2 25 24 24 26 28   GLUCOSE 119* 106* 119* 104* 103*  BUN 15 14 11 9 12   CREATININE 0.58 0.57 0.50 0.50 0.55  CALCIUM 8.3* 8.4 8.2* 8.3* 8.2*   Liver Function Tests:  Recent Labs Lab 05/22/13 1857  AST 15  ALT 12  ALKPHOS 95  BILITOT 0.3  PROT 6.7  ALBUMIN 2.5*   CBC:  Recent Labs Lab 05/22/13 1857 05/23/13  1610 05/24/13 0752 05/25/13 0650 05/26/13 0638 05/27/13 0540  WBC 10.1 8.4 7.9 8.4 9.4 9.2  NEUTROABS 6.9  --   --   --   --   --   HGB 12.3 12.3 11.4* 10.7* 10.3* 9.7*  HCT 38.3 38.0 35.7* 33.8* 32.0* 30.4*  MCV 84.9 85.4 84.8 84.9 84.7 84.0  PLT 285 270 262 267 257 255       Signed:  Basilia Jumbo, Student-PA Stephani Police, [place] [phone] [facility] 05/27/2013, 11:20 AM   Attending Patient seen and examined, agree with the assessment and plan as outlined above.Doing well, CBC stable, pain well controlled,stable for discharge to SNF today.  Windell Norfolk MD

## 2013-05-27 NOTE — Progress Notes (Signed)
Physical Therapy Treatment Patient Details Name: [name] CornersRoberta J [name] MRN: [id number] DOB: 09/04/1926 Today's Date: 05/27/2013    History of Present Illness Pt. is a resident of [facility] where she fell and sustained  a right hip displaced neck fx.  She underwent  a hemiarthroplasty on 05/24/13.      PT Comments    Patient able to tolerate increased activity with better sitting balance and able to stand.  Will need continued skilled PT at SNF.  Follow Up Recommendations  SNF;Supervision/Assistance - 24 hour;Supervision for mobility/OOB     Equipment Recommendations  None recommended by PT    Recommendations for Other Services       Precautions / Restrictions Precautions Precautions: Other (comment);Posterior Hip Precaution Comments: no precautions in orders but followed posterior hip precautions due to hemiarthroplasty Restrictions RLE Weight Bearing: Weight bearing as tolerated    Mobility  Bed Mobility Overal bed mobility: Needs Assistance Bed Mobility: Rolling;Sidelying to Sit Rolling: Mod assist Sidelying to sit: +2 for physical assistance;Mod assist   Sit to supine: Max assist;+2 for physical assistance   General bed mobility comments: assisted to roll for hygiene due to incontinent of bowel; then lowered legs off edge and lifted trunk with assist  Transfers Overall transfer level: Needs assistance Equipment used: 2 person hand held assist Transfers: Sit to/from Stand Sit to Stand: Max assist;+2 physical assistance         General transfer comment: stood twice from bed with assist for anterior weight shift and lifting assist due to fearful initially  Ambulation/Gait Ambulation/Gait assistance:  (unable)               Stairs            Wheelchair Mobility    Modified Rankin (Stroke Patients Only)       Balance Overall balance assessment: Needs assistance   Sitting balance-Leahy Scale: Poor Sitting balance - Comments: able to sit with  supervision for few seconds otherwise needed min assist due to posterior bias Postural control: Posterior lean Standing balance support: Bilateral upper extremity supported Standing balance-Leahy Scale: Poor Standing balance comment: assist to stand with bilateral Hand held assist and stood approx 10 seconds                    Cognition Arousal/Alertness: Awake/alert Behavior During Therapy: WFL for tasks assessed/performed Overall Cognitive Status: History of cognitive impairments - at baseline                      Exercises Total Joint Exercises Ankle Circles/Pumps: AAROM;10 reps;Both;Supine Heel Slides: AAROM;Right;10 reps;Supine    General Comments        Pertinent Vitals/Pain Min c/o with transitions    Home Living                      Prior Function            PT Goals (current goals can now be found in the care plan section) Progress towards PT goals: Progressing toward goals    Frequency  Min 3X/week    PT Plan Current plan remains appropriate    Co-evaluation             End of Session Equipment Utilized During Treatment: Gait belt Activity Tolerance: Patient limited by fatigue Patient left: in bed;with call bell/phone within reach;with bed alarm set     Time: 1450-1514 PT Time Calculation (min): 24 min  Charges:  $Therapeutic Exercise: 8-22  mins $Therapeutic Activity: 8-22 mins                    G Codes:      Ane PaymentCynthia R Reedy Biernat 05/27/2013, 4:18 PM Sheran Lawlessyndi Marley Pakula, [place] [phone] 05/27/2013

## 2013-05-27 NOTE — Assessment & Plan Note (Signed)
CONTINUE NAMENDA

## 2013-05-27 NOTE — Clinical Social Work Note (Signed)
Clinical Social Worker facilitated patient discharge including contacting patient family and facility to confirm patient discharge plans.  Clinical information faxed to facility and family agreeable with plan.  CSW arranged ambulance transport via [facility] to [facility].  RN to call report prior to discharge.  Clinical Social Worker will sign off for now as social work intervention is no longer needed. Please consult us again if new need arises.  Jesse Norlene Lanes, LCSW [phone] 
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
## 2013-05-27 NOTE — Assessment & Plan Note (Signed)
3L O2 routinely

## 2013-05-27 NOTE — Assessment & Plan Note (Signed)
displaced femoral neck fracture  Patient sustained RHF at SNF two days prior to admission Patient received a right hip hemiarthroplasty for right hip displaced femoral neck frature on Friday 05/24/2013  Her Afib medications were on hold due to the hypotension.  Patient developed Afib rvr prior to surgery on 05/24/2012,rate was controlled with lopressor.  She underwent right hip hemiarthroplasty with general anesthesia on 05/25/2013 with Dr. Magnus IvanBlackman and Mr. Chestine SporeClark PA-C  Patient is hemodynamically stable and will discharge to SNF for further care Per Orthopedics continue 325 mg and 81 mg aspirin for 4 weeks to prevent VTE. Then D/C 325 mg Aspirin and continue 81 mg aspirin

## 2013-05-27 NOTE — Assessment & Plan Note (Signed)
Patient has a hx of afib and on Diltiazem prior to admission Afib became uncontrolled with RVR while holding her antiarrythmic meds due to hypotension  ECG on 05/24/2013 showed afib rvr  Controlled inpatient with Lopressor  Resume Oral medications and home regimen on discharge, not a candidate for anticoagulation, maintain on ASA

## 2013-05-27 NOTE — Assessment & Plan Note (Signed)
Wound care.

## 2013-05-28 ENCOUNTER — Encounter (HOSPITAL_COMMUNITY): Payer: Self-pay | Admitting: Orthopaedic Surgery

## 2013-10-28 ENCOUNTER — Encounter: Payer: Self-pay | Admitting: Internal Medicine

## 2013-10-28 ENCOUNTER — Non-Acute Institutional Stay (SKILLED_NURSING_FACILITY): Payer: Medicare Other | Admitting: Internal Medicine

## 2013-10-28 DIAGNOSIS — I4891 Unspecified atrial fibrillation: Secondary | ICD-10-CM

## 2013-10-28 DIAGNOSIS — N182 Chronic kidney disease, stage 2 (mild): Secondary | ICD-10-CM

## 2013-10-28 DIAGNOSIS — J9610 Chronic respiratory failure, unspecified whether with hypoxia or hypercapnia: Secondary | ICD-10-CM

## 2013-10-28 DIAGNOSIS — N039 Chronic nephritic syndrome with unspecified morphologic changes: Secondary | ICD-10-CM

## 2013-10-28 DIAGNOSIS — N189 Chronic kidney disease, unspecified: Secondary | ICD-10-CM

## 2013-10-28 DIAGNOSIS — J439 Emphysema, unspecified: Secondary | ICD-10-CM

## 2013-10-28 DIAGNOSIS — I482 Chronic atrial fibrillation, unspecified: Secondary | ICD-10-CM

## 2013-10-28 DIAGNOSIS — J438 Other emphysema: Secondary | ICD-10-CM

## 2013-10-28 DIAGNOSIS — J9611 Chronic respiratory failure with hypoxia: Secondary | ICD-10-CM

## 2013-10-28 DIAGNOSIS — D631 Anemia in chronic kidney disease: Secondary | ICD-10-CM

## 2013-10-28 DIAGNOSIS — F0390 Unspecified dementia without behavioral disturbance: Secondary | ICD-10-CM

## 2013-10-28 DIAGNOSIS — K219 Gastro-esophageal reflux disease without esophagitis: Secondary | ICD-10-CM

## 2013-10-28 DIAGNOSIS — R0902 Hypoxemia: Secondary | ICD-10-CM

## 2013-10-28 NOTE — Assessment & Plan Note (Signed)
Chronic and stable , now on 2L O2; cont all nebs and meds

## 2013-10-28 NOTE — Assessment & Plan Note (Signed)
Stable on namenda.  °

## 2013-10-28 NOTE — Assessment & Plan Note (Addendum)
H/H 12.4/37, PLT 352, from 6/205,  with iron studies c/w iron def anemia, on iron

## 2013-10-28 NOTE — Assessment & Plan Note (Signed)
Rate control with diltiazem and bblocker,lopressor;not a candidate for anti-coag;cont ASA

## 2013-10-28 NOTE — Assessment & Plan Note (Signed)
GFR - 75, CRCl - 64 from 07/2013
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
## 2013-10-28 NOTE — Assessment & Plan Note (Signed)
mainatained on O2 2L [place]

## 2013-10-28 NOTE — Assessment & Plan Note (Signed)
Controlled on ranitidine 150 mg daily

## 2013-12-23 ENCOUNTER — Non-Acute Institutional Stay (SKILLED_NURSING_FACILITY): Payer: Medicare Other | Admitting: Internal Medicine

## 2013-12-23 DIAGNOSIS — I482 Chronic atrial fibrillation, unspecified: Secondary | ICD-10-CM

## 2013-12-23 DIAGNOSIS — J439 Emphysema, unspecified: Secondary | ICD-10-CM

## 2013-12-23 DIAGNOSIS — R1310 Dysphagia, unspecified: Secondary | ICD-10-CM

## 2013-12-23 DIAGNOSIS — D631 Anemia in chronic kidney disease: Secondary | ICD-10-CM

## 2013-12-23 DIAGNOSIS — N189 Chronic kidney disease, unspecified: Secondary | ICD-10-CM

## 2013-12-23 DIAGNOSIS — N182 Chronic kidney disease, stage 2 (mild): Secondary | ICD-10-CM

## 2013-12-23 DIAGNOSIS — F0390 Unspecified dementia without behavioral disturbance: Secondary | ICD-10-CM
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
## 2013-12-26 ENCOUNTER — Encounter: Payer: Self-pay | Admitting: Internal Medicine

## 2013-12-26 NOTE — Assessment & Plan Note (Signed)
Stable, doing well and without decline since hip fx 7 months ago

## 2013-12-26 NOTE — Assessment & Plan Note (Signed)
Chronic and stable and without aspirations

## 2013-12-26 NOTE — Assessment & Plan Note (Signed)
Rate controlled on cardizem and metoprolol; not cand for anti-coag;pt has IVC filter;on ASA 81 mg

## 2013-12-26 NOTE — Assessment & Plan Note (Signed)
GFR 75; CrCl -64, stable

## 2013-12-26 NOTE — Assessment & Plan Note (Addendum)
No change in condition;pot is stable so continue scheduled and prn nebs and MDI

## 2013-12-26 NOTE — Assessment & Plan Note (Signed)
No recent CBC's, no reason;pt on daily iron

## 2014-02-10 ENCOUNTER — Non-Acute Institutional Stay (SKILLED_NURSING_FACILITY): Payer: Medicare Other | Admitting: Internal Medicine

## 2014-02-10 DIAGNOSIS — I10 Essential (primary) hypertension: Secondary | ICD-10-CM

## 2014-02-10 DIAGNOSIS — I482 Chronic atrial fibrillation, unspecified: Secondary | ICD-10-CM

## 2014-02-10 DIAGNOSIS — E538 Deficiency of other specified B group vitamins: Secondary | ICD-10-CM

## 2014-02-10 DIAGNOSIS — N182 Chronic kidney disease, stage 2 (mild): Secondary | ICD-10-CM

## 2014-02-10 DIAGNOSIS — D509 Iron deficiency anemia, unspecified: Secondary | ICD-10-CM

## 2014-02-10 DIAGNOSIS — J309 Allergic rhinitis, unspecified: Secondary | ICD-10-CM
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
## 2014-02-15 ENCOUNTER — Encounter: Payer: Self-pay | Admitting: Internal Medicine

## 2014-02-15 DIAGNOSIS — N182 Chronic kidney disease, stage 2 (mild): Secondary | ICD-10-CM

## 2014-02-15 DIAGNOSIS — I1310 Hypertensive heart and chronic kidney disease without heart failure, with stage 1 through stage 4 chronic kidney disease, or unspecified chronic kidney disease: Secondary | ICD-10-CM | POA: Insufficient documentation

## 2014-02-15 DIAGNOSIS — E538 Deficiency of other specified B group vitamins: Secondary | ICD-10-CM | POA: Insufficient documentation

## 2014-02-15 NOTE — Assessment & Plan Note (Signed)
GFR 60, CrCL 53, slt decline;continue to monitor

## 2014-02-15 NOTE — Assessment & Plan Note (Signed)
Hb 13.4/Hct 40 which is improved;will continue daily supplement

## 2014-02-15 NOTE — Assessment & Plan Note (Signed)
Metoprolol d/c due to hypotension and bradycardia and diltizem continues

## 2014-02-15 NOTE — Assessment & Plan Note (Signed)
Improved after injection started in 07/2013 and level in 12/2013 639;will continue injections and follow level in march for consideration of d/c supplements

## 2014-02-15 NOTE — Assessment & Plan Note (Signed)
Chronic and stable, controlled on flonase and zantac 150 mg daily

## 2014-02-15 NOTE — Assessment & Plan Note (Signed)
Metoprolol d/c 2/2 low BP and HR; rate controlle with cardizem 180 mg and pt on ASA only as not candidate for anti-coag

## 2014-03-24 ENCOUNTER — Other Ambulatory Visit: Payer: Self-pay | Admitting: *Deleted

## 2014-03-24 MED ORDER — HYDROCODONE-ACETAMINOPHEN 5-325 MG PO TABS: 1.0000 | ORAL_TABLET | Freq: Four times a day (QID) | ORAL | Status: DC | PRN

## 2014-03-24 NOTE — Telephone Encounter (Signed)
[facility] 

## 2014-04-25 ENCOUNTER — Non-Acute Institutional Stay (SKILLED_NURSING_FACILITY): Payer: Medicare Other | Admitting: Internal Medicine

## 2014-04-25 DIAGNOSIS — F330 Major depressive disorder, recurrent, mild: Secondary | ICD-10-CM

## 2014-04-25 DIAGNOSIS — R1310 Dysphagia, unspecified: Secondary | ICD-10-CM

## 2014-04-25 DIAGNOSIS — I482 Chronic atrial fibrillation, unspecified: Secondary | ICD-10-CM

## 2014-04-25 DIAGNOSIS — F0390 Unspecified dementia without behavioral disturbance: Secondary | ICD-10-CM

## 2014-04-25 DIAGNOSIS — J439 Emphysema, unspecified: Secondary | ICD-10-CM

## 2014-04-25 DIAGNOSIS — I10 Essential (primary) hypertension: Secondary | ICD-10-CM
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
## 2014-05-05 ENCOUNTER — Non-Acute Institutional Stay (SKILLED_NURSING_FACILITY): Payer: Medicare Other | Admitting: Internal Medicine

## 2014-05-05 DIAGNOSIS — S42202A Unspecified fracture of upper end of left humerus, initial encounter for closed fracture: Secondary | ICD-10-CM
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
## 2014-05-06 ENCOUNTER — Encounter: Payer: Self-pay | Admitting: Internal Medicine

## 2014-05-06 DIAGNOSIS — S42209A Unspecified fracture of upper end of unspecified humerus, initial encounter for closed fracture: Secondary | ICD-10-CM | POA: Insufficient documentation

## 2014-05-06 NOTE — Assessment & Plan Note (Signed)
My first view of the pt was her Xray in the machine shortly after it was taken. Obvious fracture, below neck, proximal humerus, comminuted and mildly displaced. It was late in the afternoon, there is no need for pt to go to ED. Will immobilize with sling and start some pain medication. Pt can follow up with ortho in their office tomorrow. It may need surgery but they won't be doing it tonight, no need to take pt out of her comfort zone tonight.

## 2014-05-10 ENCOUNTER — Emergency Department (HOSPITAL_COMMUNITY)
Admission: EM | Admit: 2014-05-10 | Discharge: 2014-05-11 | Disposition: A | Discharge status: Home / Self Care | Payer: Medicare Other | Attending: Emergency Medicine | Admitting: Emergency Medicine

## 2014-05-10 ENCOUNTER — Encounter (HOSPITAL_COMMUNITY): Payer: Self-pay | Admitting: Emergency Medicine

## 2014-05-10 ENCOUNTER — Emergency Department (HOSPITAL_COMMUNITY): Payer: Medicare Other

## 2014-05-10 DIAGNOSIS — N189 Chronic kidney disease, unspecified: Secondary | ICD-10-CM | POA: Diagnosis not present

## 2014-05-10 DIAGNOSIS — I129 Hypertensive chronic kidney disease with stage 1 through stage 4 chronic kidney disease, or unspecified chronic kidney disease: Secondary | ICD-10-CM | POA: Diagnosis not present

## 2014-05-10 DIAGNOSIS — I4891 Unspecified atrial fibrillation: Secondary | ICD-10-CM | POA: Insufficient documentation

## 2014-05-10 DIAGNOSIS — Z79899 Other long term (current) drug therapy: Secondary | ICD-10-CM | POA: Insufficient documentation

## 2014-05-10 DIAGNOSIS — J449 Chronic obstructive pulmonary disease, unspecified: Secondary | ICD-10-CM | POA: Insufficient documentation

## 2014-05-10 DIAGNOSIS — Y999 Unspecified external cause status: Secondary | ICD-10-CM | POA: Diagnosis not present

## 2014-05-10 DIAGNOSIS — S40022A Contusion of left upper arm, initial encounter: Secondary | ICD-10-CM

## 2014-05-10 DIAGNOSIS — K219 Gastro-esophageal reflux disease without esophagitis: Secondary | ICD-10-CM | POA: Insufficient documentation

## 2014-05-10 DIAGNOSIS — Y929 Unspecified place or not applicable: Secondary | ICD-10-CM | POA: Insufficient documentation

## 2014-05-10 DIAGNOSIS — S4992XA Unspecified injury of left shoulder and upper arm, initial encounter: Secondary | ICD-10-CM | POA: Diagnosis present

## 2014-05-10 DIAGNOSIS — X58XXXA Exposure to other specified factors, initial encounter: Secondary | ICD-10-CM | POA: Diagnosis not present

## 2014-05-10 DIAGNOSIS — Z7982 Long term (current) use of aspirin: Secondary | ICD-10-CM | POA: Insufficient documentation

## 2014-05-10 DIAGNOSIS — Y939 Activity, unspecified: Secondary | ICD-10-CM | POA: Diagnosis not present

## 2014-05-10 LAB — BASIC METABOLIC PANEL
ANION GAP: 9 (ref 5–15)
BUN: 22 mg/dL (ref 6–23)
CHLORIDE: 101 mmol/L (ref 96–112)
CO2: 30 mmol/L (ref 19–32)
CREATININE: 0.79 mg/dL (ref 0.50–1.10)
Calcium: 8.7 mg/dL (ref 8.4–10.5)
GFR calc non Af Amer: 74 mL/min — ABNORMAL LOW (ref >90)
GFR, EST AFRICAN AMERICAN: 86 mL/min — AB (ref >90)
GLUCOSE: 138 mg/dL — AB (ref 70–99)
Potassium: 3.3 mmol/L — ABNORMAL LOW (ref 3.5–5.1)
Sodium: 140 mmol/L (ref 135–145)

## 2014-05-10 LAB — CBC WITH DIFFERENTIAL/PLATELET
Basophils Absolute: 0 10*3/uL (ref 0.0–0.1)
Basophils Relative: 0 % (ref 0–1)
Eosinophils Absolute: 0.4 10*3/uL (ref 0.0–0.7)
Eosinophils Relative: 4 % (ref 0–5)
HCT: 38.9 % (ref 36.0–46.0)
Hemoglobin: 12.4 g/dL (ref 12.0–15.0)
Lymphocytes Relative: 22 % (ref 12–46)
Lymphs Abs: 2.3 10*3/uL (ref 0.7–4.0)
MCH: 28.1 pg (ref 26.0–34.0)
MCHC: 31.9 g/dL (ref 30.0–36.0)
MCV: 88.2 fL (ref 78.0–100.0)
Monocytes Absolute: 0.7 10*3/uL (ref 0.1–1.0)
Monocytes Relative: 7 % (ref 3–12)
NEUTROS ABS: 6.8 10*3/uL (ref 1.7–7.7)
Neutrophils Relative %: 67 % (ref 43–77)
PLATELETS: 359 10*3/uL (ref 150–400)
RBC: 4.41 MIL/uL (ref 3.87–5.11)
RDW: 13.4 % (ref 11.5–15.5)
WBC: 10.3 10*3/uL (ref 4.0–10.5)

## 2014-05-10 LAB — BRAIN NATRIURETIC PEPTIDE: B NATRIURETIC PEPTIDE 5: 17.8 pg/mL (ref 0.0–100.0)

## 2014-05-10 LAB — PROTIME-INR
INR: 1.12 (ref 0.00–1.49)
PROTHROMBIN TIME: 14.6 s (ref 11.6–15.2)

## 2014-05-10 MED ORDER — OXYCODONE-ACETAMINOPHEN 5-325 MG PO TABS
1.0000 | ORAL_TABLET | Freq: Once | ORAL | Status: AC
  Administered 2014-05-10: 1 via ORAL
  Filled 2014-05-10: qty 1

## 2014-05-10 MED ORDER — ENOXAPARIN SODIUM 80 MG/0.8ML ~~LOC~~ SOLN
75.0000 mg | Freq: Once | SUBCUTANEOUS | Status: AC
  Administered 2014-05-10: 75 mg via SUBCUTANEOUS
  Filled 2014-05-10: qty 0.8

## 2014-05-10 NOTE — ED Notes (Signed)
Family at bedside. 

## 2014-05-10 NOTE — Discharge Instructions (Signed)
IMPORTANT PATIENT INSTRUCTIONS:  You have been scheduled for an Outpatient [facility] .  If tomorrow is a Saturday or Sunday, please go to the [facility] Registration Desk at 8:30 am tomorrow morning and tell them you are there for a vascular study.  If tomorrow is a weekday (Monday-Friday), [facility] at 8:30 am tomorrow morning  and  tell them you are there for a vascular study.  Humerus Fracture, Treated with Immobilization The humerus is the large bone in your upper arm. You have a broken (fractured) humerus. These fractures are easily diagnosed with X-rays. TREATMENT  Simple fractures which will heal without disability are treated with simple immobilization. Immobilization means you will wear a cast, splint, or sling. You have a fracture which will do well with immobilization. The fracture will heal well simply by being held in a good position until it is stable enough to begin range of motion exercises. Do not take part in activities which would further injure your arm.  HOME CARE INSTRUCTIONS   Put ice on the injured area.  Put ice in a plastic bag.  Place a towel between your skin and the bag.  Leave the ice on for 15-20 minutes, 03-04 times a day.  If you have a cast:  Do not scratch the skin under the cast using sharp or pointed objects.  Check the skin around the cast every day. You may put lotion on any red or sore areas.  Keep your cast dry and clean.  If you have a splint:  Wear the splint as directed.  Keep your splint dry and clean.  You may loosen the elastic around the splint if your fingers become numb, tingle, or turn cold or blue.  If you have a sling:  Wear the sling as directed.  Do not put pressure on any part of your cast or splint until it is fully hardened.  Your cast or splint can be protected during bathing with a plastic bag. Do not lower the cast or splint into  water.  Only take over-the-counter or prescription medicines for pain, discomfort, or fever as directed by your caregiver.  Do range of motion exercises as instructed by your caregiver.  Follow up as directed by your caregiver. This is very important in order to avoid permanent injury or disability and chronic pain. SEEK IMMEDIATE MEDICAL CARE IF:   Your skin or nails in the injured arm turn blue or gray.  Your arm feels cold or numb.  You develop severe pain in the injured arm.  You are having problems with the medicines you were given. MAKE SURE YOU:   Understand these instructions.  Will watch your condition.  Will get help right away if you are not doing well or get worse. Document Released: 04/25/2000 Document Revised: 04/11/2011 Document Reviewed: 03/03/2010 [facility] Patient Information 2015 [employer] [place]. This information is not intended to replace advice given to you by your health care provider. Make sure you discuss any questions you have with your health care provider.

## 2014-05-10 NOTE — ED Provider Notes (Signed)
CSN: [id number]     Arrival date & time 05/10/14  1955 History   First MD Initiated Contact with Patient 05/10/14 2008     Chief Complaint  Patient presents with  . Edema     (Consider location/radiation/quality/duration/timing/severity/associated sxs/prior Treatment) HPI Comments: Patient presents with left arm edema for at least one day after a fall on 05/05/2014 with subsequent diagnosed left humerus fracture.  Patient per report has not been complaining of increased pain, chest pain, shortness of breath.  She's not had swelling of other extremity she's not had fevers or chills   Past Medical History  Diagnosis Date  . Atrial fib/flutter, transient   . COPD (chronic obstructive pulmonary disease)   . GERD (gastroesophageal reflux disease)   . Pain   . Hypertension   . Chronic kidney disease    Past Surgical History  Procedure Laterality Date  . Nasal sinus surgery    . Cholecystectomy    . Joint replacement    . Hip arthroplasty Right 05/24/2013    Procedure: RIGHT HIP HEMIARTHROPLASTY;  Surgeon: Kathryne Hitchhristopher Y Blackman, MD;  Location: [facility] OR;  Service: Orthopedics;  Laterality: Right;   No family history on file. History  Substance Use Topics  . Smoking status: Never Smoker   . Smokeless tobacco: Never Used  . Alcohol Use: No   OB History    No data available     Review of Systems  Constitutional: Negative for fever, chills, diaphoresis, activity change, appetite change and fatigue.  HENT: Negative for congestion, facial swelling, rhinorrhea and sore throat.   Eyes: Negative for photophobia and discharge.  Respiratory: Negative for cough, chest tightness and shortness of breath.   Cardiovascular: Negative for chest pain, palpitations and leg swelling.  Gastrointestinal: Negative for nausea, vomiting, abdominal pain and diarrhea.  Endocrine: Negative for polydipsia and polyuria.  Genitourinary: Negative for dysuria, frequency, difficulty urinating and pelvic pain.    Musculoskeletal: Negative for back pain, arthralgias, neck pain and neck stiffness.  Skin: Negative for color change and wound.  Allergic/Immunologic: Negative for immunocompromised state.  Neurological: Negative for facial asymmetry, weakness, numbness and headaches.  Hematological: Does not bruise/bleed easily.  Psychiatric/Behavioral: Negative for confusion and agitation.      Allergies  Review of patient's allergies indicates no known allergies.  Home Medications   Prior to Admission medications   Medication Sig Start Date End Date Taking? Authorizing Provider  acetaminophen (TYLENOL) 325 MG tablet Take 650 mg by mouth every 4 (four) hours as needed for mild pain.    Historical Provider, MD  albuterol (PROVENTIL) (2.5 MG/3ML) 0.083% nebulizer solution Take 2.5 mg by nebulization every 6 (six) hours as needed for wheezing or shortness of breath.    Historical Provider, MD  arformoterol (BROVANA) 15 MCG/2ML NEBU Take 15 mcg by nebulization 2 (two) times daily.    Historical Provider, MD  aspirin EC 81 MG tablet Take 81 mg by mouth daily.    Historical Provider, MD  bisacodyl (DULCOLAX) 10 MG suppository Place 10 mg rectally as needed for moderate constipation.    Historical Provider, MD  diltiazem (DILACOR XR) 180 MG 24 hr capsule Take 1 capsule (180 mg total) by mouth daily. 06/13/11 05/27/13  Shanker Levora DredgeM Ghimire, MD  ergocalciferol (VITAMIN D2) 50000 UNITS capsule Take 50,000 Units by mouth every 30 (thirty) days.    Historical Provider, MD  ferrous sulfate 325 (65 FE) MG tablet Take 325 mg by mouth daily with breakfast.    Historical Provider, MD  fluticasone (FLONASE) 50 MCG/ACT nasal spray Place 2 sprays into the nose daily.    Historical Provider, MD  HYDROcodone-acetaminophen (NORCO/VICODIN) 5-325 MG per tablet Take 1 tablet by mouth every 6 (six) hours as needed for moderate pain. 03/24/14   Tiffany L Reed, DO  ipratropium-albuterol (DUONEB) 0.5-2.5 (3) MG/3ML SOLN Take 3 mLs by  nebulization every 4 (four) hours as needed (wheezing).    Historical Provider, MD  magnesium hydroxide (MILK OF MAGNESIA) 400 MG/5ML suspension Take 30 mLs by mouth daily as needed for mild constipation.    Historical Provider, MD  memantine (NAMENDA) 10 MG tablet Take 10 mg by mouth 2 (two) times daily.    Historical Provider, MD  ondansetron (ZOFRAN) 4 MG tablet Take 1 tablet (4 mg total) by mouth every 6 (six) hours as needed for nausea. 05/27/13   Tora Kindred York, PA-C  PARoxetine (PAXIL) 10 MG tablet Take 10 mg by mouth daily.    Historical Provider, MD  promethazine (PHENERGAN) 25 MG suppository Place 25 mg rectally every 6 (six) hours as needed for nausea or vomiting.    Historical Provider, MD  psyllium (METAMUCIL) 58.6 % packet Take 1 packet by mouth daily.    Historical Provider, MD  ranitidine (ZANTAC) 150 MG tablet Take 150 mg by mouth daily as needed. Acid reflux    Historical Provider, MD   BP 109/67 mmHg  Pulse 84  Temp(Src) 98.1 F (36.7 C) (Oral)  Resp 18  Ht  (1.676 m)  SpO2 94% Physical Exam  Musculoskeletal:       Right forearm: She exhibits edema.       Arms: 2+ right radial pulse 2+ left radial pulse pitting edema of left arm    ED Course  Procedures (including critical care time) Labs Review Labs Reviewed  BASIC METABOLIC PANEL - Abnormal; Notable for the following:    Potassium 3.3 (*)    Glucose, Bld 138 (*)    GFR calc non Af Amer 74 (*)    GFR calc Af Amer 86 (*)    All other components within normal limits  CBC WITH DIFFERENTIAL/PLATELET  PROTIME-INR  BRAIN NATRIURETIC PEPTIDE    Imaging Review Dg Forearm Left  05/10/2014   CLINICAL DATA:  Fall.  Humerus fracture.  Hematoma.  EXAM: LEFT FOREARM - 2 VIEW  COMPARISON:  None.  FINDINGS: Extensive soft tissue swelling is present over the proximal forearm. There is no underlying fracture. The elbow and wrist joints are intact.  IMPRESSION: Extensive soft tissue swelling or edema within the proximal  forearm.  No acute fracture.   Electronically Signed   By: Marin Roberts M.D.   On: 05/10/2014 23:07   Dg Humerus Left  05/10/2014   CLINICAL DATA:  Fall last week. Hematoma of the left humerus and proximal forearm.  EXAM: LEFT HUMERUS - 2+ VIEW  COMPARISON:  None available.  FINDINGS: An oblique fracture in the proximal left humerus is displaced laterally 1 shaft width. The shoulder joint is located. The elbow is intact.  IMPRESSION: 1. Oblique fracture of the proximal left humerus is displaced laterally 1 shaft width.   Electronically Signed   By: Marin Roberts M.D.   On: 05/10/2014 23:06     EKG Interpretation None      MDM   Final diagnoses:  Arm contusion, left, initial encounter    Pt is a 81 y.o. female with Pmhx as above who presents with L arm edema, after reported humerus fx from fall  on 4/4.  Per report, she has seen an orthopedist through [facility], and presents in a sugar tong splint with Ace wrap,around, upper arm.  Patient has no acute complaints.  Denies increased pain, shortness of breath, chest pain, fevers, chills, leg swelling, numbness or weakness.  Patient has pitting edema to the entirety of the right arm, with ecchymosis which is worse at the mid humerus. She denies numbness or paresthesias, compartments are soft.  She is decreased range of motion at the elbow and shoulder.  Lungs are rhonchorous (per chart history of COPD).  No leg swelling.  Heart rate is normal.  O2 sats are 99% on room air.   XR with humeral fx, but no forearm fx. CBC, BMP grossly unremarkable. BNL not elevated. I suspect PE findings are post-traumatic, but will have her return tomorrow for vascuclar study to r/o DVT. Will given dose of Lovenox in ED. Splint reapplied, pt can f/u with Dewaine Conger as scheduled in 1 week. Arm needs to be elevated and iced at facility.     [facility] evaluation in the Emergency Department is complete. It has been determined that no acute  conditions requiring further emergency intervention are present at this time. The patient/guardian have been advised of the diagnosis and plan. We have discussed signs and symptoms that warrant return to the ED, such as changes or worsening in symptoms, worsening pain, swelling.       Toy Cookey, MD 05/11/14 [phone]

## 2014-05-10 NOTE — ED Notes (Signed)
Felix Pacinienata Bestress, RN at [facility] states that unsure of when pt fell, unsure of follow up appointment but reports swelling to left arm has increasingly gotten worse today. Dr. Micheline Mazeocherty made aware.

## 2014-05-10 NOTE — ED Notes (Signed)
Per EMS: pt from [facility] for eval of increase in swelling to left arm, pt has hard cast placed due to arm fracture from fall on 3/31. EMS noted pitting edema to left arm and faint pulse noted to be palpated. EMS also noted warm to touch, pt denies any pain at this time, axo to baseline per facility.

## 2014-05-10 NOTE — ED Notes (Signed)
Patient transported to X-ray 

## 2014-05-11 ENCOUNTER — Encounter: Payer: Self-pay | Admitting: Internal Medicine

## 2014-05-11 NOTE — Assessment & Plan Note (Signed)
Pt continues at risk for falls and elopement 2/2 poor retention with conversation/warnings;Plan- continue namenda 10 mg BID and continue to monitor

## 2014-05-11 NOTE — Assessment & Plan Note (Signed)
Continues good rate control with diltiazem alone;not on ASA or anti coag, has IVC filter;Plan - continue diltiazem

## 2014-05-11 NOTE — Assessment & Plan Note (Signed)
Chronic and stable with resident on O2 2L lPlan- continue Brovana and prn nebs

## 2014-05-11 NOTE — Assessment & Plan Note (Signed)
HR and BP stable since d/c of bblocker;plan- continue diltiazem 180 mg daily

## 2014-05-11 NOTE — Assessment & Plan Note (Signed)
Pt was on paxil prior, it was stipped due to pharmacy recs;family finds pt less responsive; Plan- paxil restarted to improve mood

## 2014-05-11 NOTE — Assessment & Plan Note (Signed)
Chronic and stable on mechanical soft, without aspiration;Plan- continue present diet and monitoring

## 2014-05-13 ENCOUNTER — Encounter: Payer: Self-pay | Admitting: Internal Medicine

## 2014-05-13 ENCOUNTER — Non-Acute Institutional Stay (SKILLED_NURSING_FACILITY): Payer: Medicare Other | Admitting: Internal Medicine

## 2014-05-13 DIAGNOSIS — S42202D Unspecified fracture of upper end of left humerus, subsequent encounter for fracture with routine healing: Secondary | ICD-10-CM
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
## 2014-05-13 NOTE — Assessment & Plan Note (Signed)
Pt is 7 days out of fracture. Pt was sent next day, 4/5,  to [facility] where she was seen by Mr.Barry DienesOwens who it was reported would not see her because she had no family with her. Her son was ill and her fracture was an unplanned event. Mr. Barry DienesOwens spoke to the [facility] nurse and to the DON, both of who knew already she had a proximal humerus fracture and knew the pt as well as family, but did not see pt, or look at her Xray and told her to go to ED. Keeping pt out of ED had been the point all along and she came back to SNF, was tx with her sling and pain meds and was seen by Dr Madelon Lipsaffrey at [facility] on 4/6 where she was dx with proximal 1/3 humerus fx and where it was decided on conservative tx initially 2/2 co-morbidites. Pt was placed in  a coaptation splint. She was sent to ED on 4/9 2/2 swelling L arm, I don't know what happened to cause her to be sent. There was some consideration that there could be a clot in arm apparently but pt without other signs or sx of clot. Pt continues today without signs or sx of UE clot or PE.Pt is to f/u Dr Madelon Lipsaffrey 10 days from original visit.

## 2014-08-24 ENCOUNTER — Encounter: Payer: Self-pay | Admitting: Internal Medicine

## 2014-08-24 ENCOUNTER — Non-Acute Institutional Stay (SKILLED_NURSING_FACILITY): Payer: Medicare Other | Admitting: Internal Medicine

## 2014-08-24 DIAGNOSIS — I1310 Hypertensive heart and chronic kidney disease without heart failure, with stage 1 through stage 4 chronic kidney disease, or unspecified chronic kidney disease: Secondary | ICD-10-CM | POA: Diagnosis not present

## 2014-08-24 DIAGNOSIS — D509 Iron deficiency anemia, unspecified: Secondary | ICD-10-CM | POA: Diagnosis not present

## 2014-08-24 DIAGNOSIS — N182 Chronic kidney disease, stage 2 (mild): Secondary | ICD-10-CM | POA: Diagnosis not present
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
## 2014-08-24 NOTE — Assessment & Plan Note (Signed)
Controlled on current regimen; Plan - continue Cardizem CD 180 mg

## 2014-08-24 NOTE — Assessment & Plan Note (Addendum)
On 06/2014  BUN 16/CR 0.62 with CrCl - 72 which is an improvement but more important not a decline; Plan - cont to monitor

## 2014-08-24 NOTE — Assessment & Plan Note (Signed)
Latest Hb 12.6/HCT 38.4, slt drop from prior;Plan - cont FeSO4 325 mg daily and cont monitor daily

## 2014-12-11 NOTE — Progress Notes (Signed)
This encounter was created in error - please disregard.

## 2014-12-22 ENCOUNTER — Encounter: Payer: Self-pay | Admitting: Internal Medicine

## 2014-12-22 ENCOUNTER — Non-Acute Institutional Stay (SKILLED_NURSING_FACILITY): Payer: Medicare Other | Admitting: Internal Medicine

## 2014-12-22 DIAGNOSIS — I10 Essential (primary) hypertension: Secondary | ICD-10-CM | POA: Diagnosis not present

## 2014-12-22 DIAGNOSIS — D509 Iron deficiency anemia, unspecified: Secondary | ICD-10-CM

## 2014-12-22 DIAGNOSIS — I482 Chronic atrial fibrillation, unspecified: Secondary | ICD-10-CM

## 2014-12-22 DIAGNOSIS — K219 Gastro-esophageal reflux disease without esophagitis: Secondary | ICD-10-CM | POA: Diagnosis not present

## 2014-12-22 DIAGNOSIS — J439 Emphysema, unspecified: Secondary | ICD-10-CM | POA: Diagnosis not present

## 2014-12-22 DIAGNOSIS — F0390 Unspecified dementia without behavioral disturbance: Secondary | ICD-10-CM | POA: Diagnosis not present

## 2014-12-22 NOTE — Progress Notes (Signed)
Patient ID: [name] [name], female   DOB: 04/05/1926, 81 y.o.   MRN: [id number]    DATE: 12/22/14  Location:  [facility]    Place of Service: SNF (31)   Extended Emergency Contact Information Primary Emergency Contact: Wakeland,William Address: [place], [place] [address] [place] Phone: [phone] Mobile Phone: [phone] Relation: Son Secondary Emergency Contact: Cox,June  [place] Phone: [phone] Relation: Relative  Advanced Directive information  DNR  Chief Complaint  Patient presents with  . Medical Management of Chronic Issues    HPI:  81 yo female long term resident seen today for f/u. She has no c/o. She is a poor historian due to dementia. Hx obtained from chart. No nursing concerns.  Afib/aflutter - rate controlled on diltiazem. Takes ASA daily  COPD - stable on duoneb, flonase, brovana  GERD - stable on protonix  HTN - BP stable on diltiazem  CKD - stable   Dementia - stable on namenda. Takes paxil and remeron  Hx anemia - stable on iron supplement and B12 injection   Past Medical History  Diagnosis Date  . Atrial fib/flutter, transient   . COPD (chronic obstructive pulmonary disease) ([place])   . GERD (gastroesophageal reflux disease)   . Pain   . Hypertension   . Chronic kidney disease     Past Surgical History  Procedure Laterality Date  . Nasal sinus surgery    . Cholecystectomy    . Joint replacement    . Hip arthroplasty Right 05/24/2013    Procedure: RIGHT HIP HEMIARTHROPLASTY;  Surgeon: Mcarthur Rossetti, MD;  Location: [place];  Service: Orthopedics;  Laterality: Right;    Patient Care Team: Hennie Duos, MD as PCP - General (Internal Medicine) Lauree Chandler, NP as Nurse Practitioner (Nurse Practitioner)  Social History   Social History  . Marital Status: Widowed    Spouse Name: N/A  . Number of Children: N/A  . Years of Education:  N/A   Occupational History  . Not on file.   Social History Main Topics  . Smoking status: Never Smoker   . Smokeless tobacco: Never Used  . Alcohol Use: No  . Drug Use: No  . Sexual Activity: No   Other Topics Concern  . Not on file   Social History Narrative     reports that she has never smoked. She has never used smokeless tobacco. She reports that she does not drink alcohol or use illicit drugs.  Immunization History  Administered Date(s) Administered  . Influenza-Unspecified 11/01/2011, 11/06/2013  . Pneumococcal-Unspecified 08/17/2008  . Tdap 03/24/2011    No Known Allergies  Medications: Patient's Medications  New Prescriptions   No medications on file  Previous Medications   ACETAMINOPHEN (TYLENOL) 325 MG TABLET    Take 650 mg by mouth every 4 (four) hours as needed for mild pain.   ALBUTEROL (PROVENTIL) (2.5 MG/3ML) 0.083% NEBULIZER SOLUTION    Take 2.5 mg by nebulization every 6 (six) hours as needed for wheezing or shortness of breath.   ARFORMOTEROL (BROVANA) 15 MCG/2ML NEBU    Take 15 mcg by nebulization 2 (two) times daily.   ASPIRIN EC 81 MG TABLET    Take 81 mg by mouth daily.   BISACODYL (DULCOLAX) 10 MG SUPPOSITORY    Place 10 mg rectally as needed for moderate constipation.   DILTIAZEM (DILACOR XR) 180 MG  24 HR CAPSULE    Take 1 capsule (180 mg total) by mouth daily.   ERGOCALCIFEROL (VITAMIN D2) 50000 UNITS CAPSULE    Take 50,000 Units by mouth every 30 (thirty) days.   FERROUS SULFATE 325 (65 FE) MG TABLET    Take 325 mg by mouth daily with breakfast.   FLUTICASONE (FLONASE) 50 MCG/ACT NASAL SPRAY    Place 2 sprays into the nose daily.   HYDROCODONE-ACETAMINOPHEN (NORCO/VICODIN) 5-325 MG PER TABLET    Take 1 tablet by mouth every 6 (six) hours as needed for moderate pain.   IPRATROPIUM-ALBUTEROL (DUONEB) 0.5-2.5 (3) MG/3ML SOLN    Take 3 mLs by nebulization every 4 (four) hours as needed (wheezing).   MAGNESIUM HYDROXIDE (MILK OF MAGNESIA) 400 MG/5ML  SUSPENSION    Take 30 mLs by mouth daily as needed for mild constipation.   MEMANTINE (NAMENDA) 10 MG TABLET    Take 10 mg by mouth 2 (two) times daily.   ONDANSETRON (ZOFRAN) 4 MG TABLET    Take 1 tablet (4 mg total) by mouth every 6 (six) hours as needed for nausea.   PAROXETINE (PAXIL) 10 MG TABLET    Take 10 mg by mouth daily.   PROMETHAZINE (PHENERGAN) 25 MG SUPPOSITORY    Place 25 mg rectally every 6 (six) hours as needed for nausea or vomiting.   PSYLLIUM (METAMUCIL) 58.6 % PACKET    Take 1 packet by mouth daily.   RANITIDINE (ZANTAC) 150 MG TABLET    Take 150 mg by mouth daily as needed. Acid reflux  Modified Medications   No medications on file  Discontinued Medications   No medications on file    Review of Systems  Unable to perform ROS: Dementia    Filed Vitals:   12/22/14 1649  BP: 110/71  Pulse: 64  Temp: 97.9 F (36.6 C)  Weight: 160 lb 3.2 oz (72.666 kg)   Body mass index is 25.87 kg/(m^2).  Physical Exam  Constitutional: She appears well-developed and well-nourished.  HENT:  Mouth/Throat: Oropharynx is clear and moist. No oropharyngeal exudate.  Eyes: Pupils are equal, round, and reactive to light. No scleral icterus.  Neck: Neck supple. Carotid bruit is not present. No tracheal deviation present. No thyromegaly present.  Cardiovascular: Normal rate, regular rhythm, normal heart sounds and intact distal pulses.  Exam reveals no gallop and no friction rub.   No murmur heard. No LE edema b/l. no calf TTP.   Pulmonary/Chest: Effort normal. No stridor. No respiratory distress. She has wheezes (end expiratory). She has no rales.  Abdominal: Soft. Bowel sounds are normal. She exhibits no distension and no mass. There is no hepatomegaly. There is no tenderness. There is no rebound and no guarding.  Lymphadenopathy:    She has no cervical adenopathy.  Neurological: She is alert.  Skin: Skin is warm and dry. No rash noted.  Psychiatric: She has a normal mood and  affect. Her behavior is normal.     Labs reviewed: No visits with results within 3 Month(s) from this visit. Latest known visit with results is:  Admission on 05/10/2014, Discharged on 05/11/2014  Component Date Value Ref Range Status  . WBC 05/10/2014 10.3  4.0 - 10.5 K/uL Final  . RBC 05/10/2014 4.41  3.87 - 5.11 MIL/uL Final  . Hemoglobin 05/10/2014 12.4  12.0 - 15.0 g/dL Final  . HCT 05/10/2014 38.9  36.0 - 46.0 % Final  . MCV 05/10/2014 88.2  78.0 - 100.0 fL Final  . MCH  05/10/2014 28.1  26.0 - 34.0 pg Final  . MCHC 05/10/2014 31.9  30.0 - 36.0 g/dL Final  . RDW 05/10/2014 13.4  11.5 - 15.5 % Final  . Platelets 05/10/2014 359  150 - 400 K/uL Final  . Neutrophils Relative % 05/10/2014 67  43 - 77 % Final  . Neutro Abs 05/10/2014 6.8  1.7 - 7.7 K/uL Final  . Lymphocytes Relative 05/10/2014 22  12 - 46 % Final  . Lymphs Abs 05/10/2014 2.3  0.7 - 4.0 K/uL Final  . Monocytes Relative 05/10/2014 7  3 - 12 % Final  . Monocytes Absolute 05/10/2014 0.7  0.1 - 1.0 K/uL Final  . Eosinophils Relative 05/10/2014 4  0 - 5 % Final  . Eosinophils Absolute 05/10/2014 0.4  0.0 - 0.7 K/uL Final  . Basophils Relative 05/10/2014 0  0 - 1 % Final  . Basophils Absolute 05/10/2014 0.0  0.0 - 0.1 K/uL Final  . Sodium 05/10/2014 140  135 - 145 mmol/L Final  . Potassium 05/10/2014 3.3* 3.5 - 5.1 mmol/L Final  . Chloride 05/10/2014 101  96 - 112 mmol/L Final  . CO2 05/10/2014 30  19 - 32 mmol/L Final  . Glucose, Bld 05/10/2014 138* 70 - 99 mg/dL Final  . BUN 05/10/2014 22  6 - 23 mg/dL Final  . Creatinine, Ser 05/10/2014 0.79  0.50 - 1.10 mg/dL Final  . Calcium 05/10/2014 8.7  8.4 - 10.5 mg/dL Final  . GFR calc non Af Amer 05/10/2014 74* >90 mL/min Final  . GFR calc Af Amer 05/10/2014 86* >90 mL/min Final   Comment: (NOTE) The eGFR has been calculated using the CKD EPI equation. This calculation has not been validated in all clinical situations. eGFR's persistently <90 mL/min signify possible  Chronic Kidney Disease.   . Anion gap 05/10/2014 9  5 - 15 Final  . Prothrombin Time 05/10/2014 14.6  11.6 - 15.2 seconds Final  . INR 05/10/2014 1.12  0.00 - 1.49 Final  . B Natriuretic Peptide 05/10/2014 17.8  0.0 - 100.0 pg/mL Final    No results found.   Assessment/Plan   ICD-9-CM ICD-10-CM   1. Dementia without behavioral disturbance 294.20 F03.90   2. Essential hypertension 401.9 I10   3. Atrial fibrillation, chronic (HCC) 427.31 I48.2   4. Pulmonary emphysema, unspecified emphysema type ([place]) 492.8 J43.9   5. Gastroesophageal reflux disease without esophagitis 530.81 K21.9   6. Anemia, iron deficiency 280.9 D50.9    [facility] provider to follow  Pt is medically stable on current tx plan. Continue current medications as ordered. PT/OT/ST as indicated. Will follow  Javier Gell S. Perlie Gold  [facility] [address] [place], [place] [address] [phone] Cell (Monday-Friday 8 AM - 5 PM) [phone] After 5 PM and follow prompts

## 2015-02-20 LAB — BASIC METABOLIC PANEL
BUN: 15 mg/dL (ref 4–21)
CREATININE: 0.7 mg/dL (ref 0.5–1.1)
GLUCOSE: 169 mg/dL
POTASSIUM: 3.6 mmol/L (ref 3.4–5.3)
Sodium: 141 mmol/L (ref 137–147)

## 2015-02-20 LAB — CBC AND DIFFERENTIAL
HCT: 43 % (ref 36–46)
Hemoglobin: 14.2 g/dL (ref 12.0–16.0)
Platelets: 286 10*3/uL (ref 150–399)
WBC: 5.8 10^3/mL

## 2015-02-20 LAB — HEPATIC FUNCTION PANEL
ALT: 11 U/L (ref 7–35)
AST: 21 U/L (ref 13–35)
Alkaline Phosphatase: 87 U/L (ref 25–125)
Bilirubin, Total: 0.5 mg/dL

## 2015-03-16 ENCOUNTER — Non-Acute Institutional Stay (SKILLED_NURSING_FACILITY): Payer: Medicare Other | Admitting: Internal Medicine

## 2015-03-16 ENCOUNTER — Encounter: Payer: Self-pay | Admitting: Internal Medicine

## 2015-03-16 DIAGNOSIS — J439 Emphysema, unspecified: Secondary | ICD-10-CM | POA: Diagnosis not present

## 2015-03-16 DIAGNOSIS — J9611 Chronic respiratory failure with hypoxia: Secondary | ICD-10-CM

## 2015-03-16 DIAGNOSIS — I482 Chronic atrial fibrillation, unspecified: Secondary | ICD-10-CM

## 2015-03-16 DIAGNOSIS — F330 Major depressive disorder, recurrent, mild: Secondary | ICD-10-CM

## 2015-03-16 DIAGNOSIS — I10 Essential (primary) hypertension: Secondary | ICD-10-CM

## 2015-03-16 DIAGNOSIS — M2550 Pain in unspecified joint: Secondary | ICD-10-CM | POA: Diagnosis not present

## 2015-03-16 DIAGNOSIS — F0390 Unspecified dementia without behavioral disturbance: Secondary | ICD-10-CM

## 2015-03-16 DIAGNOSIS — K219 Gastro-esophageal reflux disease without esophagitis: Secondary | ICD-10-CM

## 2015-03-16 DIAGNOSIS — D509 Iron deficiency anemia, unspecified: Secondary | ICD-10-CM

## 2015-03-16 NOTE — Progress Notes (Signed)
Patient ID: [name] [name], female   DOB: 1926-02-22, 82 y.o.   MRN: [id number]    DATE: 03/16/15  Location:  [facility]  Nursing Home Room Number: 126 A Place of Service: SNF (31)   Extended Emergency Contact Information Primary Emergency Contact: Feltz,William Address: [address]          [place], [place] [address] [place] Home Phone: [phone] Mobile Phone: [phone] Relation: Son Secondary Emergency Contact: Cox,June  [place] Home Phone: [phone] Relation: Relative  Advanced Directive information Does patient have an advance directive?: Yes, Type of Advance Directive: Out of facility DNR (pink MOST or yellow form), Does patient want to make changes to advanced directive?: No - Patient declined  Chief Complaint  Patient presents with  . Medical Management of Chronic Issues    Routine Visit; [facility]    HPI:  82 yo female long term resident seen today for f/u. She c/o "my pants feel wet". She has urinary incontinence and wears depends. She has no other concerns. No nursing issues. No falls. She is a poor historian due to dementia. Hx obtained from chart  Afib/aflutter - rate controlled on diltiazem. She takes ASA daily  COPD - stable on brovana and duonebs. She has flonase for seasonal allergy  GERD - stable on protonix  HTN - BP stable on diltiazem. She takes an ASA daily  CKD - Cr stable  Dementia/MDD - stable on namenda. She takes remeron and paxil for mood and appetite stimulant  Chronic pain - stable on norco and tylenol  Anemia - stable on iron supplements and B12 injections  She also takes vitamin/mineral supplements  Past Medical History  Diagnosis Date  . Atrial fib/flutter, transient   . COPD (chronic obstructive pulmonary disease) (HCC)   . GERD (gastroesophageal reflux disease)   . Pain   . Hypertension   . Chronic kidney disease     Past Surgical History  Procedure Laterality Date  .  Nasal sinus surgery    . Cholecystectomy    . Joint replacement    . Hip arthroplasty Right 05/24/2013    Procedure: RIGHT HIP HEMIARTHROPLASTY;  Surgeon: Kathryne Hitch, MD;  Location: [facility] OR;  Service: Orthopedics;  Laterality: Right;    Patient Care Team: Margit Hanks, MD as PCP - General (Internal Medicine) Sharon Seller, NP as Nurse Practitioner (Nurse Practitioner)  Social History   Social History  . Marital Status: Widowed    Spouse Name: N/A  . Number of Children: N/A  . Years of Education: N/A   Occupational History  . Not on file.   Social History Main Topics  . Smoking status: Never Smoker   . Smokeless tobacco: Never Used  . Alcohol Use: No  . Drug Use: No  . Sexual Activity: No   Other Topics Concern  . Not on file   Social History Narrative     reports that she has never smoked. She has never used smokeless tobacco. She reports that she does not drink alcohol or use illicit drugs.  Immunization History  Administered Date(s) Administered  . Influenza-Unspecified 11/01/2011, 11/06/2013, 11/12/2014  . Pneumococcal-Unspecified 08/17/2008  . Tdap 03/24/2011    No Known Allergies  Medications: Patient's Medications  New Prescriptions   No medications on file  Previous Medications   ACETAMINOPHEN (TYLENOL) 325 MG TABLET    Take 650 mg by mouth every 4 (four) hours as needed for mild pain.  ARFORMOTEROL (BROVANA) 15 MCG/2ML NEBU    Take 15 mcg by nebulization 2 (two) times daily.   ASCORBIC ACID (VITAMIN C) 500 MG TABLET    Take 500 mg by mouth 2 (two) times daily.   ASPIRIN EC 81 MG TABLET    Take 81 mg by mouth daily.   BISACODYL (DULCOLAX) 10 MG SUPPOSITORY    Place 10 mg rectally as needed for moderate constipation.   CYANOCOBALAMIN (,VITAMIN B-12,) 1000 MCG/ML INJECTION    Inject 1,000 mcg into the muscle every 30 (thirty) days.   DILTIAZEM (DILACOR XR) 180 MG 24 HR CAPSULE    Take 180 mg by mouth daily.   ERGOCALCIFEROL (VITAMIN D2)  50000 UNITS CAPSULE    Take 50,000 Units by mouth every 30 (thirty) days.   FERROUS SULFATE 325 (65 FE) MG TABLET    Take 325 mg by mouth daily with breakfast.   FLUTICASONE (FLONASE) 50 MCG/ACT NASAL SPRAY    Place 2 sprays into the nose daily.   HYDROCODONE-ACETAMINOPHEN (NORCO/VICODIN) 5-325 MG PER TABLET    Take 1 tablet by mouth every 6 (six) hours as needed for moderate pain.   IPRATROPIUM-ALBUTEROL (DUONEB) 0.5-2.5 (3) MG/3ML SOLN    Take 3 mLs by nebulization every 4 (four) hours as needed (wheezing).   MAGNESIUM HYDROXIDE (MILK OF MAGNESIA) 400 MG/5ML SUSPENSION    Take 30 mLs by mouth daily as needed for mild constipation.   MEMANTINE (NAMENDA) 10 MG TABLET    Take 10 mg by mouth 2 (two) times daily.   MIRTAZAPINE (REMERON) 7.5 MG TABLET    Take 7.5 mg by mouth at bedtime.   PANTOPRAZOLE (PROTONIX) 40 MG TABLET    Take 40 mg by mouth daily.   PAROXETINE (PAXIL) 10 MG TABLET    Take 10 mg by mouth daily.   PROMETHAZINE (PHENERGAN) 25 MG SUPPOSITORY    Place 25 mg rectally every 6 (six) hours as needed for nausea or vomiting.   PSYLLIUM (METAMUCIL) 58.6 % PACKET    Take 1 packet by mouth daily.  Modified Medications   No medications on file  Discontinued Medications   ALBUTEROL (PROVENTIL) (2.5 MG/3ML) 0.083% NEBULIZER SOLUTION    Take 2.5 mg by nebulization every 6 (six) hours as needed for wheezing or shortness of breath.   DILTIAZEM (DILACOR XR) 180 MG 24 HR CAPSULE    Take 1 capsule (180 mg total) by mouth daily.   ONDANSETRON (ZOFRAN) 4 MG TABLET    Take 1 tablet (4 mg total) by mouth every 6 (six) hours as needed for nausea.   RANITIDINE (ZANTAC) 150 MG TABLET    Take 150 mg by mouth daily as needed. Acid reflux    Review of Systems  Unable to perform ROS: Dementia    Filed Vitals:   03/16/15 1640  BP: 126/74  Pulse: 64  Temp: 97.9 F (36.6 C)  TempSrc: Oral  Resp: 18  Height:  (1.676 m)  Weight: 164 lb (74.39 kg)   Body mass index is 26.48 kg/(m^2).  Physical  Exam  Constitutional: She appears well-developed.  Sitting in w/c in NAD, frail appearing   HENT:  Mouth/Throat: Oropharynx is clear and moist. No oropharyngeal exudate.  MMM  Eyes: Pupils are equal, round, and reactive to light. No scleral icterus.  Neck: Neck supple. Carotid bruit is not present. No tracheal deviation present.  Cardiovascular: Normal rate and intact distal pulses.  An irregularly irregular rhythm present. Exam reveals no gallop and no friction rub.  Murmur heard.  Systolic murmur is present with a grade of 1/6  No LE edema b/l. no calf TTP.   Pulmonary/Chest: Effort normal. No stridor. No respiratory distress. She has wheezes (b/l end expiratory wheezing). She has no rales.  Abdominal: Soft. Bowel sounds are normal. She exhibits no distension and no mass. There is no hepatomegaly. There is no tenderness. There is no rebound and no guarding.  Musculoskeletal: She exhibits edema and tenderness.  Lymphadenopathy:    She has no cervical adenopathy.  Neurological: She is alert.  Skin: Skin is warm and dry. No rash noted.  Psychiatric: She has a normal mood and affect. Her behavior is normal.     Labs reviewed: Nursing Home on 03/16/2015  Component Date Value Ref Range Status  . Hemoglobin 02/20/2015 14.2  12.0 - 16.0 g/dL Final  . HCT 40/98/1191 43  36 - 46 % Final  . Platelets 02/20/2015 286  150 - 399 K/L Final  . WBC 02/20/2015 5.8   Final  . Glucose 02/20/2015 169   Final  . BUN 02/20/2015 15  4 - 21 mg/dL Final  . Creatinine 47/82/9562 0.7  0.5 - 1.1 mg/dL Final  . Potassium 13/08/6576 3.6  3.4 - 5.3 mmol/L Final  . Sodium 02/20/2015 141  137 - 147 mmol/L Final  . Alkaline Phosphatase 02/20/2015 87  25 - 125 U/L Final  . ALT 02/20/2015 11  7 - 35 U/L Final  . AST 02/20/2015 21  13 - 35 U/L Final  . Bilirubin, Total 02/20/2015 0.5   Final    No results found.   Assessment/Plan   ICD-9-CM ICD-10-CM   1. Dementia without behavioral disturbance 294.20  F03.90   2. Pulmonary emphysema, unspecified emphysema type (HCC) 492.8 J43.9   3. Major depressive disorder, recurrent episode, mild (HCC) 296.31 F33.0   4. Gastroesophageal reflux disease without esophagitis 530.81 K21.9   5. Atrial fibrillation, chronic (HCC) 427.31 I48.2   6. Essential hypertension 401.9 I10   7. Anemia, iron deficiency 280.9 D50.9   8. Chronic respiratory failure with hypoxia (HCC) 518.83 J96.11    799.02    9. Pain, joint, multiple sites 719.49 M25.50     Pt is medically stable on current tx plan. Continue current medications as ordered. PT/OT/ST as indicated. Will follow  [facility] NP to follow  [facility] S. Ancil Linsey  [facility] and Adult Medicine [address] [place], [place] [address] [phone] Cell (Monday-Friday 8 AM - 5 PM) [phone] After 5 PM and follow prompts

## 2015-05-07 ENCOUNTER — Encounter: Payer: Self-pay | Admitting: Internal Medicine

## 2015-05-07 ENCOUNTER — Non-Acute Institutional Stay (SKILLED_NURSING_FACILITY): Payer: Medicare Other | Admitting: Internal Medicine

## 2015-05-07 DIAGNOSIS — F330 Major depressive disorder, recurrent, mild: Secondary | ICD-10-CM | POA: Diagnosis not present

## 2015-05-07 DIAGNOSIS — M2550 Pain in unspecified joint: Secondary | ICD-10-CM

## 2015-05-07 DIAGNOSIS — F0390 Unspecified dementia without behavioral disturbance: Secondary | ICD-10-CM | POA: Diagnosis not present

## 2015-05-07 DIAGNOSIS — D509 Iron deficiency anemia, unspecified: Secondary | ICD-10-CM

## 2015-05-07 DIAGNOSIS — J439 Emphysema, unspecified: Secondary | ICD-10-CM

## 2015-05-07 DIAGNOSIS — J9611 Chronic respiratory failure with hypoxia: Secondary | ICD-10-CM

## 2015-05-07 DIAGNOSIS — I482 Chronic atrial fibrillation, unspecified: Secondary | ICD-10-CM

## 2015-05-07 DIAGNOSIS — I10 Essential (primary) hypertension: Secondary | ICD-10-CM | POA: Diagnosis not present

## 2015-05-07 NOTE — Progress Notes (Signed)
Patient ID: [name] [name], female   DOB: [birth year], 82 y.o.   MRN: [id number]    DATE: 05/07/15  Location:  [facility]  Nursing Home Room Number: 126 A Place of Service: SNF (31)   Extended Emergency Contact Information Primary Emergency Contact: Stapel,William Address: [address]          [place], [place] [address] [place] Home Phone: [phone] Mobile Phone: [phone] Relation: Son Secondary Emergency Contact: Cox,June  [place] Home Phone: [phone] Relation: Relative  Advanced Directive information Does patient have an advance directive?: Yes, Type of Advance Directive: Out of facility DNR (pink MOST or yellow form), Does patient want to make changes to advanced directive?: No - Patient declined  Chief Complaint  Patient presents with  . Medical Management of Chronic Issues    HPI:  82 yo female long term resident seen today for f/u. She has no c/o. She is eating and sleeping well. No falls. No nursing issues. She is a poor historian due to dementia. Hx obtained from chart.  afib/aflutter - rate controlled on diltiazem. She takes ASA daily  COPD - stable on brovana and duonebs. She has flonase for seasonal allergy  GERD - stable on protonix  HTN - BP stable on diltiazem. She takes an ASA daily  CKD - Cr stable  Dementia/MDD - stable on namenda. She takes remeron and paxil for mood and appetite stimulant  Chronic pain - stable on norco and tylenol  Anemia - stable on iron supplements and B12 injections  She also takes vitamin/mineral supplements   Past Medical History  Diagnosis Date  . Atrial fib/flutter, transient   . COPD (chronic obstructive pulmonary disease) (HCC)   . GERD (gastroesophageal reflux disease)   . Pain   . Hypertension   . Chronic kidney disease     Past Surgical History  Procedure Laterality Date  . Nasal sinus surgery    . Cholecystectomy    . Joint replacement    . Hip  arthroplasty Right 05/24/2013    Procedure: RIGHT HIP HEMIARTHROPLASTY;  Surgeon: Kathryne Hitch, MD;  Location: [facility] OR;  Service: Orthopedics;  Laterality: Right;    Patient Care Team: Margit Hanks, MD as PCP - General (Internal Medicine) Sharon Seller, NP as Nurse Practitioner (Nurse Practitioner)  Social History   Social History  . Marital Status: Widowed    Spouse Name: N/A  . Number of Children: N/A  . Years of Education: N/A   Occupational History  . Not on file.   Social History Main Topics  . Smoking status: Never Smoker   . Smokeless tobacco: Never Used  . Alcohol Use: No  . Drug Use: No  . Sexual Activity: No   Other Topics Concern  . Not on file   Social History Narrative     reports that she has never smoked. She has never used smokeless tobacco. She reports that she does not drink alcohol or use illicit drugs.  Immunization History  Administered Date(s) Administered  . Influenza-Unspecified 11/01/2011, 11/06/2013, 11/12/2014  . Pneumococcal-Unspecified 08/17/2008  . Tdap 03/24/2011    No Known Allergies  Medications: Patient's Medications  New Prescriptions   No medications on file  Previous Medications   ACETAMINOPHEN (TYLENOL) 325 MG TABLET    Take 650 mg by mouth every 4 (four) hours as needed for mild pain.   AMBULATORY NON FORMULARY MEDICATION    NSA Med Pass: Give 120  ml twice daily for supplement.   AMBULATORY NON FORMULARY MEDICATION    Magic cup each day.   ARFORMOTEROL (BROVANA) 15 MCG/2ML NEBU    Take 15 mcg by nebulization 2 (two) times daily.   ASCORBIC ACID (VITAMIN C) 500 MG TABLET    Take 500 mg by mouth 2 (two) times daily.   BISACODYL (DULCOLAX) 10 MG SUPPOSITORY    Place 10 mg rectally as needed for moderate constipation.   CHOLECALCIFEROL 50000 UNITS CAPSULE    Give 1 capsule on the 12th of every month for supplement.   CYANOCOBALAMIN (,VITAMIN B-12,) 1000 MCG/ML INJECTION    Inject 1,000 mcg into the muscle every  30 (thirty) days.   DILTIAZEM (DILACOR XR) 180 MG 24 HR CAPSULE    Take 180 mg by mouth daily.   FERROUS SULFATE 325 (65 FE) MG TABLET    Take 325 mg by mouth daily with breakfast.   FLUTICASONE (FLONASE) 50 MCG/ACT NASAL SPRAY    Place 1 spray into the nose daily.    HYDROCODONE-ACETAMINOPHEN (NORCO/VICODIN) 5-325 MG PER TABLET    Take 1 tablet by mouth every 6 (six) hours as needed for moderate pain.   IPRATROPIUM-ALBUTEROL (DUONEB) 0.5-2.5 (3) MG/3ML SOLN    Take 3 mLs by nebulization every 4 (four) hours as needed (wheezing).   MAGNESIUM HYDROXIDE (MILK OF MAGNESIA) 400 MG/5ML SUSPENSION    Take 30 mLs by mouth daily as needed for mild constipation.   MEMANTINE (NAMENDA) 10 MG TABLET    Take 10 mg by mouth 2 (two) times daily.   MIRTAZAPINE (REMERON) 7.5 MG TABLET    Take 7.5 mg by mouth at bedtime.   PANTOPRAZOLE (PROTONIX) 40 MG TABLET    Take 40 mg by mouth daily.   PAROXETINE (PAXIL) 10 MG TABLET    Take 10 mg by mouth daily.   PSYLLIUM (METAMUCIL) 58.6 % PACKET    Take 1 packet by mouth daily.  Modified Medications   No medications on file  Discontinued Medications   ASPIRIN EC 81 MG TABLET    Take 81 mg by mouth daily.   ERGOCALCIFEROL (VITAMIN D2) 50000 UNITS CAPSULE    Take 50,000 Units by mouth every 30 (thirty) days.   PROMETHAZINE (PHENERGAN) 25 MG SUPPOSITORY    Place 25 mg rectally every 6 (six) hours as needed for nausea or vomiting.    Review of Systems  Unable to perform ROS: Dementia    Filed Vitals:   05/07/15 1105  BP: 110/71  Pulse: 70  Temp: 97.2 F (36.2 C)  TempSrc: Oral  Resp: 18  Height: 5\' 6"  (1.676 m)  Weight: 163 lb 12.8 oz (74.299 kg)   Body mass index is 26.45 kg/(m^2).  Physical Exam  Constitutional: She appears well-developed.  Sitting on toilet in NAD, frail appearing. No conversational dyspnea   HENT:  Mouth/Throat: Oropharynx is clear and moist. No oropharyngeal exudate.  MMM  Eyes: Pupils are equal, round, and reactive to light. No  scleral icterus.  Neck: Neck supple. Carotid bruit is not present. No tracheal deviation present.  Cardiovascular: Normal rate, regular rhythm and intact distal pulses.  Exam reveals no gallop and no friction rub.   Murmur heard.  Systolic murmur is present with a grade of 1/6  No LE edema b/l. no calf TTP.   Pulmonary/Chest: Effort normal. No stridor. No respiratory distress. She has wheezes (L>R end expiratory wheezing at base). She has no rales.  Abdominal: Soft. Bowel sounds are normal. She exhibits  no distension and no mass. There is no hepatomegaly. There is no tenderness. There is no rebound and no guarding.  Musculoskeletal: She exhibits edema and tenderness.  Lymphadenopathy:    She has no cervical adenopathy.  Neurological: She is alert.  Skin: Skin is warm and dry. No rash noted.  Psychiatric: She has a normal mood and affect. Her behavior is normal.     Labs reviewed: Nursing Home on 03/16/2015  Component Date Value Ref Range Status  . Hemoglobin 02/20/2015 14.2  12.0 - 16.0 g/dL Final  . HCT 16/11/9602 43  36 - 46 % Final  . Platelets 02/20/2015 286  150 - 399 K/L Final  . WBC 02/20/2015 5.8   Final  . Glucose 02/20/2015 169   Final  . BUN 02/20/2015 15  4 - 21 mg/dL Final  . Creatinine 54/10/8117 0.7  0.5 - 1.1 mg/dL Final  . Potassium 14/78/2956 3.6  3.4 - 5.3 mmol/L Final  . Sodium 02/20/2015 141  137 - 147 mmol/L Final  . Alkaline Phosphatase 02/20/2015 87  25 - 125 U/L Final  . ALT 02/20/2015 11  7 - 35 U/L Final  . AST 02/20/2015 21  13 - 35 U/L Final  . Bilirubin, Total 02/20/2015 0.5   Final    No results found.   Assessment/Plan   ICD-9-CM ICD-10-CM   1. Pulmonary emphysema, unspecified emphysema type (HCC) 492.8 J43.9   2. Atrial fibrillation, chronic (HCC) 427.31 I48.2   3. Pain, joint, multiple sites 719.49 M25.50   4. Major depressive disorder, recurrent episode, mild (HCC) 296.31 F33.0   5. Dementia without behavioral disturbance 294.20 F03.90     6. Anemia, iron deficiency 280.9 D50.9   7. Essential hypertension 401.9 I10   8. Chronic respiratory failure with hypoxia (HCC) 518.83 J96.11    799.02      [facility] NP to follow  Pt is medically stable on current tx plan. Continue current medications as ordered. PT/OT/ST as indicated. Will follow  Brin Ruggerio S. Ancil Linsey  [facility] and Adult Medicine [address] [place], [place] [address] [phone] Cell (Monday-Friday 8 AM - 5 PM) [phone] After 5 PM and follow prompts

## 2015-06-11 ENCOUNTER — Non-Acute Institutional Stay (SKILLED_NURSING_FACILITY): Payer: Medicare Other | Admitting: Internal Medicine

## 2015-06-11 ENCOUNTER — Encounter: Payer: Self-pay | Admitting: Internal Medicine

## 2015-06-11 DIAGNOSIS — M2550 Pain in unspecified joint: Secondary | ICD-10-CM

## 2015-06-11 DIAGNOSIS — D509 Iron deficiency anemia, unspecified: Secondary | ICD-10-CM | POA: Diagnosis not present

## 2015-06-11 DIAGNOSIS — I10 Essential (primary) hypertension: Secondary | ICD-10-CM | POA: Insufficient documentation

## 2015-06-11 DIAGNOSIS — F0390 Unspecified dementia without behavioral disturbance: Secondary | ICD-10-CM

## 2015-06-11 DIAGNOSIS — F330 Major depressive disorder, recurrent, mild: Secondary | ICD-10-CM | POA: Diagnosis not present

## 2015-06-11 DIAGNOSIS — I482 Chronic atrial fibrillation, unspecified: Secondary | ICD-10-CM

## 2015-06-11 DIAGNOSIS — J439 Emphysema, unspecified: Secondary | ICD-10-CM

## 2015-06-11 NOTE — Progress Notes (Signed)
DATE: Jun 11, 2015  Location:  [facility]  Nursing Home Room Number: 126-A Place of Service: SNF (31)   Extended Emergency Contact Information Primary Emergency Contact: Wisehart,William Address: [address]          [place], [place] [address] [place] Home Phone: [phone] Mobile Phone: [phone] Relation: Son Secondary Emergency Contact: Cox,June  [place] Home Phone: [phone] Relation: Relative  Advanced Directive information  DNR  Chief Complaint  Patient presents with  . Medical Management of Chronic Issues    Routine Visit; [facility]    HPI:  82 yo female long term resident seen today for f/u. She has no c/o. She is eating and sleeping well. No falls. No nursing issues. She is a poor historian due to dementia. Hx obtained from chart.  afib/aflutter - rate controlled on diltiazem. She takes ASA daily  COPD - stable on brovana and duonebs. She has flonase for seasonal allergy  GERD - stable on protonix  HTN - BP stable on diltiazem. She takes an ASA daily  CKD - Cr stable  Dementia/MDD - stable on namenda. She takes remeron and paxil for mood and appetite stimulant  Chronic pain - stable on norco and tylenol  Anemia - stable on iron supplements and B12 injections  She also takes vitamin/mineral supplements   Past Medical History  Diagnosis Date  . Atrial fib/flutter, transient   . COPD (chronic obstructive pulmonary disease) (HCC)   . GERD (gastroesophageal reflux disease)   . Pain   . Hypertension   . Chronic kidney disease     Past Surgical History  Procedure Laterality Date  . Nasal sinus surgery    . Cholecystectomy    . Joint replacement    . Hip arthroplasty Right 05/24/2013    Procedure: RIGHT HIP HEMIARTHROPLASTY;  Surgeon: Kathryne Hitchhristopher Y Blackman, MD;  [facility];  Service: Orthopedics;  Laterality: Right;    Patient Care Team: Margit HanksAnne D Alexander, MD as PCP - General (Internal  Medicine) Sharon SellerJessica K Eubanks, NP as Nurse Practitioner (Nurse Practitioner)  Social History   Social History  . Marital Status: Widowed    Spouse Name: N/A  . Number of Children: N/A  . Years of Education: N/A   Occupational History  . Not on file.   Social History Main Topics  . Smoking status: Never Smoker   . Smokeless tobacco: Never Used  . Alcohol Use: No  . Drug Use: No  . Sexual Activity: No   Other Topics Concern  . Not on file   Social History Narrative     reports that she has never smoked. She has never used smokeless tobacco. She reports that she does not drink alcohol or use illicit drugs.  Immunization History  Administered Date(s) Administered  . Influenza-Unspecified 11/01/2011, 11/06/2013, 11/12/2014  . Pneumococcal-Unspecified 08/17/2008  . Tdap 03/24/2011    No Known Allergies  Medications: Patient's Medications  New Prescriptions   No medications on file  Previous Medications   ACETAMINOPHEN (TYLENOL) 325 MG TABLET    Take 650 mg by mouth every 4 (four) hours as needed for mild pain.   AMBULATORY NON FORMULARY MEDICATION    NSA Med Pass: Give 120 ml twice daily for supplement.   AMBULATORY NON FORMULARY MEDICATION    Magic cup each day.   ARFORMOTEROL (BROVANA) 15 MCG/2ML NEBU    Take 15 mcg by nebulization 2 (two) times daily.   ASCORBIC ACID (  VITAMIN C) 500 MG TABLET    Take 500 mg by mouth 2 (two) times daily.   BISACODYL (DULCOLAX) 10 MG SUPPOSITORY    Place 10 mg rectally as needed for moderate constipation.   CHOLECALCIFEROL 50000 UNITS CAPSULE    Give 1 capsule on the 12th of every month for supplement.   CYANOCOBALAMIN (,VITAMIN B-12,) 1000 MCG/ML INJECTION    Inject 1,000 mcg into the muscle every 30 (thirty) days.   DILTIAZEM (DILACOR XR) 180 MG 24 HR CAPSULE    Take 180 mg by mouth daily.   FERROUS SULFATE 325 (65 FE) MG TABLET    Take 325 mg by mouth 2 (two) times daily.    FLUTICASONE (FLONASE) 50 MCG/ACT NASAL SPRAY    Place 1  spray into the nose daily.    HYDROCODONE-ACETAMINOPHEN (NORCO/VICODIN) 5-325 MG PER TABLET    Take 1 tablet by mouth every 6 (six) hours as needed for moderate pain.   IPRATROPIUM-ALBUTEROL (DUONEB) 0.5-2.5 (3) MG/3ML SOLN    Take 3 mLs by nebulization every 4 (four) hours as needed (wheezing).   MAGNESIUM HYDROXIDE (MILK OF MAGNESIA) 400 MG/5ML SUSPENSION    Take 30 mLs by mouth daily as needed for mild constipation.   MEMANTINE (NAMENDA) 10 MG TABLET    Take 10 mg by mouth 2 (two) times daily.   MIRTAZAPINE (REMERON) 7.5 MG TABLET    Take 7.5 mg by mouth at bedtime.   PANTOPRAZOLE (PROTONIX) 40 MG TABLET    Take 40 mg by mouth daily.   PAROXETINE (PAXIL) 10 MG TABLET    Take 10 mg by mouth daily.   PSYLLIUM (METAMUCIL) 58.6 % PACKET    Take 1 packet by mouth daily.  Modified Medications   No medications on file  Discontinued Medications   No medications on file    Review of Systems  Unable to perform ROS: Dementia    Filed Vitals:   06/11/15 1436  BP: 106/71  Pulse: 64  Temp: 97 F (36.1 C)  TempSrc: Oral  Resp: 20  Height: 5\' 6"  (1.676 m)  Weight: 163 lb 6.4 oz (74.118 kg)   Body mass index is 26.39 kg/(m^2).  Physical Exam  Constitutional: She appears well-developed.  Sitting on toilet in NAD, frail appearing. No conversational dyspnea   HENT:  Mouth/Throat: Oropharynx is clear and moist. No oropharyngeal exudate.  MMM  Eyes: Pupils are equal, round, and reactive to light. No scleral icterus.  Neck: Neck supple. Carotid bruit is not present. No tracheal deviation present.  Cardiovascular: Normal rate, regular rhythm and intact distal pulses.  Exam reveals no gallop and no friction rub.   Murmur heard.  Systolic murmur is present with a grade of 1/6  No LE edema b/l. no calf TTP.   Pulmonary/Chest: Effort normal. No stridor. No respiratory distress. She has decreased breath sounds. She has no wheezes. She has no rales.  Abdominal: Soft. Bowel sounds are normal. She  exhibits no distension and no mass. There is no hepatomegaly. There is no tenderness. There is no rebound and no guarding.  Musculoskeletal: She exhibits edema and tenderness.  Lymphadenopathy:    She has no cervical adenopathy.  Neurological: She is alert.  Skin: Skin is warm and dry. No rash noted.  Psychiatric: She has a normal mood and affect. Her behavior is normal.     Labs reviewed: Nursing Home on 03/16/2015  Component Date Value Ref Range Status  . Hemoglobin 02/20/2015 14.2  12.0 - 16.0 g/dL Final  .  HCT 02/20/2015 43  36 - 46 % Final  . Platelets 02/20/2015 286  150 - 399 K/L Final  . WBC 02/20/2015 5.8   Final  . Glucose 02/20/2015 169   Final  . BUN 02/20/2015 15  4 - 21 mg/dL Final  . Creatinine 16/11/9602 0.7  0.5 - 1.1 mg/dL Final  . Potassium 54/10/8117 3.6  3.4 - 5.3 mmol/L Final  . Sodium 02/20/2015 141  137 - 147 mmol/L Final  . Alkaline Phosphatase 02/20/2015 87  25 - 125 U/L Final  . ALT 02/20/2015 11  7 - 35 U/L Final  . AST 02/20/2015 21  13 - 35 U/L Final  . Bilirubin, Total 02/20/2015 0.5   Final    No results found.   Assessment/Plan    ICD-9-CM ICD-10-CM   1. Dementia without behavioral disturbance 294.20 F03.90   2. Essential hypertension 401.9 I10   3. Pain, joint, multiple sites 719.49 M25.50   4. Pulmonary emphysema, unspecified emphysema type (HCC) 492.8 J43.9   5. Atrial fibrillation, chronic (HCC) 427.31 I48.2   6. Major depressive disorder, recurrent episode, mild (HCC) 296.31 F33.0   7. Anemia, iron deficiency 280.9 D50.9    [facility] NP to follow  Pt is medically stable on current tx plan. Continue current medications as ordered. PT/OT/ST as indicated. Will follow  Inaara Tye S. Ancil Linsey  [facility] [address] [place], [place] [address] [phone] Cell (Monday-Friday 8 AM - 5 PM) [phone] After 5 PM and follow prompts

## 2015-07-09 ENCOUNTER — Encounter: Payer: Self-pay | Admitting: Internal Medicine

## 2015-07-09 ENCOUNTER — Non-Acute Institutional Stay (SKILLED_NURSING_FACILITY): Payer: Medicare Other | Admitting: Internal Medicine

## 2015-07-09 DIAGNOSIS — I482 Chronic atrial fibrillation, unspecified: Secondary | ICD-10-CM

## 2015-07-09 DIAGNOSIS — K219 Gastro-esophageal reflux disease without esophagitis: Secondary | ICD-10-CM | POA: Diagnosis not present

## 2015-07-09 DIAGNOSIS — F0390 Unspecified dementia without behavioral disturbance: Secondary | ICD-10-CM | POA: Diagnosis not present

## 2015-07-09 DIAGNOSIS — F330 Major depressive disorder, recurrent, mild: Secondary | ICD-10-CM

## 2015-07-09 DIAGNOSIS — I10 Essential (primary) hypertension: Secondary | ICD-10-CM

## 2015-07-09 DIAGNOSIS — J9611 Chronic respiratory failure with hypoxia: Secondary | ICD-10-CM

## 2015-07-09 DIAGNOSIS — M2550 Pain in unspecified joint: Secondary | ICD-10-CM

## 2015-07-09 DIAGNOSIS — J439 Emphysema, unspecified: Secondary | ICD-10-CM | POA: Diagnosis not present

## 2015-07-09 NOTE — Progress Notes (Signed)
DATE: 07/09/15  Location:  [facility]  Nursing Home Room Number: 221 B Place of Service: SNF (31)   Extended Emergency Contact Information Primary Emergency Contact: Stith,William Address: [address]          [place], [place] [address] [place] Home Phone: [phone] Mobile Phone: [phone] Relation: Son Secondary Emergency Contact: Cox,June  [place] Home Phone: [phone] Relation: Relative  Advanced Directive information Does patient have an advance directive?: Yes, Type of Advance Directive: Out of facility DNR (pink MOST or yellow form), Does patient want to make changes to advanced directive?: No - Patient declined  Chief Complaint  Patient presents with  . Medical Management of Chronic Issues    Routine Visit/ [facility]    HPI:  82 yo female long term resident seen today for f/u. She has no c/o today. No nursing issues. No falls. She is a poor historian due to dementia. Hx obtained from chart.  afib/aflutter - rate controlled on diltiazem. She takes ASA daily  COPD - stable on brovana and duonebs. She has flonase for seasonal allergy  GERD - stable on protonix  HTN - BP stable on diltiazem. She takes an ASA daily  CKD - Cr stable  Dementia/MDD - stable on namenda. She takes remeron and paxil for mood and appetite stimulant  Chronic pain - stable on norco and tylenol  Anemia - stable on iron supplements and B12 injections  She also takes vitamin/mineral supplements    Past Medical History  Diagnosis Date  . Atrial fib/flutter, transient   . COPD (chronic obstructive pulmonary disease) (HCC)   . GERD (gastroesophageal reflux disease)   . Pain   . Hypertension   . Chronic kidney disease     Past Surgical History  Procedure Laterality Date  . Nasal sinus surgery    . Cholecystectomy    . Joint replacement    . Hip arthroplasty Right 05/24/2013    Procedure: RIGHT HIP HEMIARTHROPLASTY;   Surgeon: Kathryne Hitchhristopher Y Blackman, MD;  [facility];  Service: Orthopedics;  Laterality: Right;    Patient Care Team: Margit HanksAnne D Alexander, MD as PCP - General (Internal Medicine) Sharon SellerJessica K Eubanks, NP as Nurse Practitioner (Nurse Practitioner)  Social History   Social History  . Marital Status: Widowed    Spouse Name: N/A  . Number of Children: N/A  . Years of Education: N/A   Occupational History  . Not on file.   Social History Main Topics  . Smoking status: Never Smoker   . Smokeless tobacco: Never Used  . Alcohol Use: No  . Drug Use: No  . Sexual Activity: No   Other Topics Concern  . Not on file   Social History Narrative     reports that she has never smoked. She has never used smokeless tobacco. She reports that she does not drink alcohol or use illicit drugs.  History reviewed. No pertinent family history. No family status information on file.    Immunization History  Administered Date(s) Administered  . Influenza-Unspecified 11/01/2011, 11/06/2013, 11/12/2014  . Pneumococcal-Unspecified 08/17/2008  . Tdap 03/24/2011    No Known Allergies  Medications: Patient's Medications  New Prescriptions   No medications on file  Previous Medications   ACETAMINOPHEN (TYLENOL) 325 MG TABLET    Take 650 mg by mouth every 4 (four) hours as needed for mild pain.   AMBULATORY NON FORMULARY MEDICATION    NSA Med Pass: Give 120  ml twice daily for supplement.   AMBULATORY NON FORMULARY MEDICATION    Magic cup each day.   ARFORMOTEROL (BROVANA) 15 MCG/2ML NEBU    Take 15 mcg by nebulization 2 (two) times daily.   ASCORBIC ACID (VITAMIN C) 500 MG TABLET    Take 500 mg by mouth 2 (two) times daily.   CHOLECALCIFEROL 50000 UNITS CAPSULE    Give 1 capsule on the 12th and the 28th  of every month for supplement.   CYANOCOBALAMIN (,VITAMIN B-12,) 1000 MCG/ML INJECTION    Inject 1,000 mcg into the muscle every 30 (thirty) days.   DILTIAZEM (DILACOR XR) 180 MG 24 HR CAPSULE     Take 180 mg by mouth daily.   FERROUS SULFATE 325 (65 FE) MG TABLET    Take 325 mg by mouth 2 (two) times daily.    FLUTICASONE (FLONASE) 50 MCG/ACT NASAL SPRAY    Place 1 spray into the nose daily.    HYDROCODONE-ACETAMINOPHEN (NORCO/VICODIN) 5-325 MG PER TABLET    Take 1 tablet by mouth every 6 (six) hours as needed for moderate pain.   MAGNESIUM HYDROXIDE (MILK OF MAGNESIA) 400 MG/5ML SUSPENSION    Take 30 mLs by mouth daily as needed for mild constipation.   MEMANTINE (NAMENDA) 10 MG TABLET    Take 10 mg by mouth 2 (two) times daily.   MIRTAZAPINE (REMERON) 7.5 MG TABLET    Take 7.5 mg by mouth at bedtime.   PANTOPRAZOLE (PROTONIX) 40 MG TABLET    Take 40 mg by mouth daily. Take at 6:30 am on empty stomach   PAROXETINE (PAXIL) 10 MG TABLET    Take 10 mg by mouth daily.   PSYLLIUM (METAMUCIL) 58.6 % PACKET    Take 1 packet by mouth daily.  Modified Medications   No medications on file  Discontinued Medications   BISACODYL (DULCOLAX) 10 MG SUPPOSITORY    Place 10 mg rectally as needed for moderate constipation. Reported on 07/09/2015   IPRATROPIUM-ALBUTEROL (DUONEB) 0.5-2.5 (3) MG/3ML SOLN    Take 3 mLs by nebulization every 4 (four) hours as needed (wheezing). Reported on 07/09/2015    Review of Systems  Unable to perform ROS: Dementia    Filed Vitals:   07/09/15 0921  BP: 129/67  Pulse: 69  Temp: 98.2 F (36.8 C)  TempSrc: Oral  Resp: 20  Height:  (1.676 m)  Weight: 167 lb 3.2 oz (75.841 kg)   Body mass index is 27 kg/(m^2).  Physical Exam  Constitutional: She appears well-developed.  Sitting in w/c in NAD, frail appearing. No conversational dyspnea   HENT:  Mouth/Throat: Oropharynx is clear and moist. No oropharyngeal exudate.  MMM  Eyes: Pupils are equal, round, and reactive to light. No scleral icterus.  Neck: Neck supple. Carotid bruit is not present. No tracheal deviation present.  Cardiovascular: Normal rate, regular rhythm and intact distal pulses.  Exam reveals  no gallop and no friction rub.   No murmur heard. No LE edema b/l. no calf TTP.   Pulmonary/Chest: Effort normal. No stridor. No respiratory distress. She has decreased breath sounds. She has no wheezes. She has no rales.  Abdominal: Soft. Bowel sounds are normal. She exhibits no distension and no mass. There is no hepatomegaly. There is no tenderness. There is no rebound and no guarding.  Musculoskeletal: She exhibits edema and tenderness.  Lymphadenopathy:    She has no cervical adenopathy.  Neurological: She is alert.  Skin: Skin is warm and dry. No rash  noted.  Psychiatric: She has a normal mood and affect. Her behavior is normal.     Labs reviewed: No visits with results within 3 Month(s) from this visit. Latest known visit with results is:  Nursing Home on 03/16/2015  Component Date Value Ref Range Status  . Hemoglobin 02/20/2015 14.2  12.0 - 16.0 g/dL Final  . HCT 16/11/9602 43  36 - 46 % Final  . Platelets 02/20/2015 286  150 - 399 K/L Final  . WBC 02/20/2015 5.8   Final  . Glucose 02/20/2015 169   Final  . BUN 02/20/2015 15  4 - 21 mg/dL Final  . Creatinine 54/10/8117 0.7  0.5 - 1.1 mg/dL Final  . Potassium 14/78/2956 3.6  3.4 - 5.3 mmol/L Final  . Sodium 02/20/2015 141  137 - 147 mmol/L Final  . Alkaline Phosphatase 02/20/2015 87  25 - 125 U/L Final  . ALT 02/20/2015 11  7 - 35 U/L Final  . AST 02/20/2015 21  13 - 35 U/L Final  . Bilirubin, Total 02/20/2015 0.5   Final    No results found.   Assessment/Plan   ICD-9-CM ICD-10-CM   1. Pain, joint, multiple sites 719.49 M25.50   2. Dementia without behavioral disturbance 294.20 F03.90   3. Pulmonary emphysema, unspecified emphysema type (HCC) 492.8 J43.9   4. Essential hypertension 401.9 I10   5. Atrial fibrillation, chronic (HCC) 427.31 I48.2   6. Major depressive disorder, recurrent episode, mild (HCC) 296.31 F33.0   7. Chronic respiratory failure with hypoxia (HCC) 518.83 J96.11    799.02    8.  Gastroesophageal reflux disease without esophagitis 530.81 K21.9     [facility] NP to follow  Check CMP, B12 level, ferrtin, iron/TIBC level next month  Pt is medically stable on current tx plan. Continue current medications as ordered. PT/OT/ST as indicated. Will follow   Tyrece Vanterpool S. Ancil Linsey  [facility] [address] [place], [place] [address] [phone] Cell (Monday-Friday 8 AM - 5 PM) [phone] After 5 PM and follow prompts

## 2015-07-24 LAB — CBC AND DIFFERENTIAL
HCT: 38 % (ref 36–46)
Hemoglobin: 12.5 g/dL (ref 12.0–16.0)
Platelets: 270 10*3/uL (ref 150–399)
WBC: 6.5 10^3/mL

## 2015-07-24 LAB — BASIC METABOLIC PANEL
BUN: 10 mg/dL (ref 4–21)
Creatinine: 0.6 mg/dL (ref 0.5–1.1)
Glucose: 96 mg/dL
POTASSIUM: 3.8 mmol/L (ref 3.4–5.3)
Sodium: 143 mmol/L (ref 137–147)

## 2015-08-17 ENCOUNTER — Encounter: Payer: Self-pay | Admitting: Internal Medicine

## 2015-08-17 ENCOUNTER — Non-Acute Institutional Stay (SKILLED_NURSING_FACILITY): Payer: Medicare Other | Admitting: Internal Medicine

## 2015-08-17 DIAGNOSIS — Z79899 Other long term (current) drug therapy: Secondary | ICD-10-CM

## 2015-08-17 DIAGNOSIS — E538 Deficiency of other specified B group vitamins: Secondary | ICD-10-CM

## 2015-08-17 DIAGNOSIS — I10 Essential (primary) hypertension: Secondary | ICD-10-CM

## 2015-08-17 DIAGNOSIS — F330 Major depressive disorder, recurrent, mild: Secondary | ICD-10-CM | POA: Diagnosis not present

## 2015-08-17 DIAGNOSIS — I482 Chronic atrial fibrillation, unspecified: Secondary | ICD-10-CM

## 2015-08-17 DIAGNOSIS — D509 Iron deficiency anemia, unspecified: Secondary | ICD-10-CM | POA: Diagnosis not present

## 2015-08-17 DIAGNOSIS — J439 Emphysema, unspecified: Secondary | ICD-10-CM | POA: Diagnosis not present

## 2015-08-17 DIAGNOSIS — F0390 Unspecified dementia without behavioral disturbance: Secondary | ICD-10-CM

## 2015-08-17 NOTE — Progress Notes (Signed)
Patient ID: [name] [name], female   DOB: 10/13/1926, 82 y.o.   MRN: [id number]     DATE: 08/17/15  Location:  Nursing Home Location: [facility]  Nursing Home Room Number: 221 B Place of Service: SNF (31)   Extended Emergency Contact Information Primary Emergency Contact: Azbell,William Address: [address]          [place], [place] [address] [place] Home Phone: [phone] Mobile Phone: [phone] Relation: Son Secondary Emergency Contact: Cox,June  [place] Home Phone: [phone] Relation: Relative  Advanced Directive information Does patient have an advance directive?: Yes, Type of Advance Directive: Out of facility DNR (pink MOST or yellow form), Does patient want to make changes to advanced directive?: No - Patient declined  Chief Complaint  Patient presents with  . Medical Management of Chronic Issues    Routine Visit/ [facility]    HPI:  82 yo female long term resident seen today for f/u. She has no c/o today. No nursing issues. No falls. She is a poor historian due to dementia. Hx obtained from chart.  afib/aflutter - rate controlled on diltiazem. She takes ASA daily  COPD - stable on brovana and duonebs. She has flonase for seasonal allergy  GERD - stable on protonix  HTN - BP stable on diltiazem. She takes an ASA daily  CKD - Cr stable  Dementia/MDD - stable on namenda. She takes paxil for mood. No longer needs remeron as her appetite is excellent. Weight is 166 lbs. She still gets nutritional supplements. No recent albumin  Chronic pain - stable on norco and tylenol  Anemia - stable on iron supplements and B12 injections  She also takes vitamin/mineral supplements  Past Medical History  Diagnosis Date  . Atrial fib/flutter, transient   . COPD (chronic obstructive pulmonary disease) (HCC)   . GERD (gastroesophageal reflux disease)   . Pain   . Hypertension   . Chronic kidney disease     Past Surgical History   Procedure Laterality Date  . Nasal sinus surgery    . Cholecystectomy    . Joint replacement    . Hip arthroplasty Right 05/24/2013    Procedure: RIGHT HIP HEMIARTHROPLASTY;  Surgeon: Kathryne Hitch, MD;  Location: [facility] OR;  Service: Orthopedics;  Laterality: Right;    Patient Care Team: Margit Hanks, MD as PCP - General (Internal Medicine) Sharon Seller, NP as Nurse Practitioner (Nurse Practitioner)  Social History   Social History  . Marital Status: Widowed    Spouse Name: N/A  . Number of Children: N/A  . Years of Education: N/A   Occupational History  . Not on file.   Social History Main Topics  . Smoking status: Never Smoker   . Smokeless tobacco: Never Used  . Alcohol Use: No  . Drug Use: No  . Sexual Activity: No   Other Topics Concern  . Not on file   Social History Narrative     reports that she has never smoked. She has never used smokeless tobacco. She reports that she does not drink alcohol or use illicit drugs.  History reviewed. No pertinent family history. Unable to obtain due to dementia No family status information on file.    Immunization History  Administered Date(s) Administered  . Influenza-Unspecified 11/01/2011, 11/06/2013, 11/12/2014  . Pneumococcal-Unspecified 08/17/2008  . Tdap 03/24/2011    No Known Allergies  Medications: Patient's Medications  New Prescriptions   No medications on file  Previous Medications   ACETAMINOPHEN (TYLENOL) 325 MG TABLET    Take 650 mg by mouth every 4 (four) hours as needed for mild pain.   AMBULATORY NON FORMULARY MEDICATION    NSA Med Pass: Give 120 ml twice daily for supplement.   AMBULATORY NON FORMULARY MEDICATION    Magic cup each day.   ARFORMOTEROL (BROVANA) 15 MCG/2ML NEBU    Take 15 mcg by nebulization 2 (two) times daily.   ASCORBIC ACID (VITAMIN C) 500 MG TABLET    Take 500 mg by mouth 2 (two) times daily.   CHOLECALCIFEROL 50000 UNITS CAPSULE    Give 1 capsule on the 12th  and the 28th  of every month for supplement.   CYANOCOBALAMIN (,VITAMIN B-12,) 1000 MCG/ML INJECTION    Inject 1,000 mcg into the muscle every 30 (thirty) days.   DILTIAZEM (DILACOR XR) 180 MG 24 HR CAPSULE    Take 180 mg by mouth daily. Check BP and pulse and hold if HR<55 and/or SBP<100   FERROUS SULFATE 325 (65 FE) MG TABLET    Take 325 mg by mouth 2 (two) times daily.    FLUTICASONE (FLONASE) 50 MCG/ACT NASAL SPRAY    Place 1 spray into the nose daily.    HYDROCODONE-ACETAMINOPHEN (NORCO/VICODIN) 5-325 MG PER TABLET    Take 1 tablet by mouth every 6 (six) hours as needed for moderate pain.   MAGNESIUM HYDROXIDE (MILK OF MAGNESIA) 400 MG/5ML SUSPENSION    Take 30 mLs by mouth daily as needed for mild constipation.   MEMANTINE (NAMENDA) 10 MG TABLET    Take 10 mg by mouth 2 (two) times daily.   MIRTAZAPINE (REMERON) 7.5 MG TABLET    Take 7.5 mg by mouth at bedtime.   PANTOPRAZOLE (PROTONIX) 40 MG TABLET    Take 40 mg by mouth daily. Take at 6:30 am on empty stomach   PAROXETINE (PAXIL) 10 MG TABLET    Take 10 mg by mouth daily.   PSYLLIUM (METAMUCIL) 58.6 % PACKET    Take 1 packet by mouth daily.  Modified Medications   No medications on file  Discontinued Medications   No medications on file    Review of Systems  Unable to perform ROS: Dementia    Filed Vitals:   08/17/15 1053  BP: 129/67  Pulse: 74  Temp: 98 F (36.7 C)  TempSrc: Oral  Resp: 20  Height: 5\' 6"  (1.676 m)  Weight: 166 lb 9.6 oz (75.569 kg)   Body mass index is 26.9 kg/(m^2).  Physical Exam  Constitutional: She appears well-developed.  Sitting in w/c in NAD, frail appearing. No conversational dyspnea   HENT:  Mouth/Throat: Oropharynx is clear and moist. No oropharyngeal exudate.  MMM  Eyes: Pupils are equal, round, and reactive to light. No scleral icterus.  Neck: Neck supple. Carotid bruit is not present. No tracheal deviation present.  Cardiovascular: Normal rate and intact distal pulses.  An irregularly  irregular rhythm present. Exam reveals no gallop and no friction rub.   Murmur heard.  Systolic murmur is present with a grade of 1/6  No LE edema b/l. no calf TTP.   Pulmonary/Chest: Effort normal. No stridor. No respiratory distress. She has decreased breath sounds. She has wheezes (end expiratory b/l). She has no rales.  Abdominal: Soft. Bowel sounds are normal. She exhibits no distension and no mass. There is no hepatomegaly. There is no tenderness. There is no rebound and no guarding.  Musculoskeletal: She exhibits edema and tenderness.  Lymphadenopathy:    She has no cervical adenopathy.  Neurological: She is alert.  Skin: Skin is warm and dry. No rash noted.  Psychiatric: She has a normal mood and affect. Her behavior is normal.     Labs reviewed: Nursing Home on 08/17/2015  Component Date Value Ref Range Status  . Hemoglobin 07/24/2015 12.5  12.0 - 16.0 g/dL Final  . HCT 16/11/9602 38  36 - 46 % Final  . Platelets 07/24/2015 270  150 - 399 K/L Final  . WBC 07/24/2015 6.5   Final  . Glucose 07/24/2015 96   Final  . BUN 07/24/2015 10  4 - 21 mg/dL Final  . Creatinine 54/10/8117 0.6  0.5 - 1.1 mg/dL Final  . Potassium 14/78/2956 3.8  3.4 - 5.3 mmol/L Final  . Sodium 07/24/2015 143  137 - 147 mmol/L Final    No results found.   Assessment/Plan   ICD-9-CM ICD-10-CM   1. Dementia without behavioral disturbance 294.20 F03.90   2. Essential hypertension 401.9 I10   3. Atrial fibrillation, chronic (HCC) 427.31 I48.2   4. Pulmonary emphysema, unspecified emphysema type (HCC) 492.8 J43.9   5. Major depressive disorder, recurrent episode, mild (HCC) 296.31 F33.0   6. Anemia, iron deficiency 280.9 D50.9   7. B12 deficiency 266.2 E53.8   8. Encounter for long-term (current) use of high-risk medication V58.69 Z79.899    D/c remeron as her appetite is improved.   Check CMP, Vitamin B12 level, ferritin, iron/TIBC level  Cont current meds as ordered  PT/OT/St as  indicated  Nutritional supplements as ordered  Plan of care d/w [facility] NP by phone  Will follow  [facility] S. Ancil Linsey  [facility] [address] [place], [place] [address] [phone] Cell (Monday-Friday 8 AM - 5 PM) [phone] After 5 PM and follow prompts

## 2015-08-18 LAB — HEPATIC FUNCTION PANEL
ALT: 6 U/L — AB (ref 7–35)
AST: 11 U/L — AB (ref 13–35)
Alkaline Phosphatase: 65 U/L (ref 25–125)
Bilirubin, Total: 0.4 mg/dL

## 2015-08-18 LAB — BASIC METABOLIC PANEL
BUN: 12 mg/dL (ref 4–21)
Creatinine: 0.7 mg/dL (ref 0.5–1.1)
Glucose: 94 mg/dL
POTASSIUM: 3.9 mmol/L (ref 3.4–5.3)
SODIUM: 141 mmol/L (ref 137–147)

## 2015-10-08 ENCOUNTER — Non-Acute Institutional Stay (SKILLED_NURSING_FACILITY): Payer: Medicare Other | Admitting: Internal Medicine

## 2015-10-08 ENCOUNTER — Encounter: Payer: Self-pay | Admitting: *Deleted

## 2015-10-08 ENCOUNTER — Encounter: Payer: Self-pay | Admitting: Internal Medicine

## 2015-10-08 DIAGNOSIS — F0390 Unspecified dementia without behavioral disturbance: Secondary | ICD-10-CM

## 2015-10-08 DIAGNOSIS — E538 Deficiency of other specified B group vitamins: Secondary | ICD-10-CM

## 2015-10-08 DIAGNOSIS — J439 Emphysema, unspecified: Secondary | ICD-10-CM

## 2015-10-08 DIAGNOSIS — D509 Iron deficiency anemia, unspecified: Secondary | ICD-10-CM | POA: Diagnosis not present

## 2015-10-08 DIAGNOSIS — I10 Essential (primary) hypertension: Secondary | ICD-10-CM

## 2015-10-08 MED ORDER — TRAMADOL HCL 50 MG PO TABS: 50.0000 mg | ORAL_TABLET | Freq: Two times a day (BID) | ORAL | 0 refills | Status: DC | PRN

## 2015-10-08 NOTE — Progress Notes (Signed)
   [facility] Room:[address] Marga MelnickWilliam Nianna Igo [address] [place] [id number]  Chief Complaint  Patient presents with  . Medical Management of Chronic Issues    Medical Management of Chronic Issues   No Known Allergies  This is a nursing facility follow up of chronic medical diagnoses  Interim medical record and care since [facility] visit was updated with review of diagnostic studies and change in clinical status since last visit were documented.  HPI:The patient has diagnoses of GERD,B12 deficiency, hypertension, iron deficiency, COPD, and dementia. She is on iron supplement 325 mg twice a day and also B12 injections monthly Most recent labs were 08/18/15; renal function, chemistries were normal, liver function tests were slightly low. On that date iron level, ferritin and B12 level were normal . On 6/23 hemoglobin was 12.5 and hematocrit 38.   Review of systems: Dementia invalidated responses, she actually denied any active symptoms. Date given as January 1 or second, 1995. Reagan was named as Economistpresident. As stated she denies any active symptoms but has Norco ordered as needed for headaches.  Physical exam:  Pertinent or positive findings: As noted she's pleasantly demented. Mouth is sunken, she wears only the upper plate. Grade 1 systolic murmur is present. Rate and rhythm slightly irregular Breath sounds are decreased. She has scattered, minimal rales without increased work of breathing. Pedal pulses are decreased. She has mixed arthritic changes in the hands.  General appearance:Adequately nourished; no acute distress    Lymphatic: No lymphadenopathy about the head, neck, axilla . Eyes: No conjunctival inflammation or lid edema is present. There is no scleral icterus. Ears:  External ear exam shows no significant lesions or deformities.   Nose:  External nasal examination shows no deformity or inflammation. Nasal mucosa are pink and  moist without lesions ,exudates Oral exam: lips and gums are healthy appearing.There is no oropharyngeal erythema or exudate . Neck:  No thyromegaly, masses, tenderness noted.    Abdomen:Bowel sounds are normal. Abdomen is soft and nontender with no organomegaly, hernias,masses. GU: deferred  Extremities:  No cyanosis, clubbing,edema  Neurologic exam : Strength equal  in upper & lower extremities but decreased Balance,Rhomberg,finger to nose testing could not be completed due to clinical state Deep tendon reflexes are equal Skin: Warm & dry w/o tenting. No significant lesions or rash.    See summary under each active problem in the Problem List with associated updated therapeutic plan

## 2015-10-08 NOTE — Assessment & Plan Note (Signed)
Pleasantly demented and clinically stable on present regimen, no change indicated

## 2015-10-08 NOTE — Assessment & Plan Note (Signed)
CBC will be repeated to see if iron supplementation can be decreased or discontinued

## 2015-10-08 NOTE — Patient Instructions (Addendum)
New orders : CBC to assess need for twice a day iron. As she denies any active symptoms, there appears to be little indication for the narcotic pain medicine. As Paroxetine is low-dose at 10 mg, tramadol 50 mg every 12 hours should not increase serotonin syndrome risk.

## 2015-10-08 NOTE — Assessment & Plan Note (Signed)
B12 level was normal on monthly B12 injections, no change indicated

## 2015-10-08 NOTE — Assessment & Plan Note (Signed)
Clinically stable, no change indicated 

## 2015-10-08 NOTE — Assessment & Plan Note (Signed)
Blood pressure controlled, renal function normal, no change indicated

## 2015-10-13 LAB — CBC AND DIFFERENTIAL
HCT: 37 % (ref 36–46)
Hemoglobin: 12.4 g/dL (ref 12.0–16.0)
Neutrophils Absolute: 3111 /uL
PLATELETS: 284 10*3/uL (ref 150–399)
WBC: 6.1 10^3/mL

## 2015-10-15 ENCOUNTER — Encounter: Payer: Self-pay | Admitting: Internal Medicine

## 2015-10-15 ENCOUNTER — Non-Acute Institutional Stay (SKILLED_NURSING_FACILITY): Payer: Medicare Other | Admitting: Internal Medicine

## 2015-10-15 DIAGNOSIS — R110 Nausea: Secondary | ICD-10-CM

## 2015-10-15 DIAGNOSIS — J439 Emphysema, unspecified: Secondary | ICD-10-CM

## 2015-10-15 DIAGNOSIS — E538 Deficiency of other specified B group vitamins: Secondary | ICD-10-CM | POA: Diagnosis not present

## 2015-10-15 DIAGNOSIS — F0390 Unspecified dementia without behavioral disturbance: Secondary | ICD-10-CM

## 2015-10-15 DIAGNOSIS — E559 Vitamin D deficiency, unspecified: Secondary | ICD-10-CM

## 2015-10-15 NOTE — Assessment & Plan Note (Addendum)
08/18/15 B12 level was 494 Continue monthly B12

## 2015-10-15 NOTE — Assessment & Plan Note (Signed)
10/15/15  Dementia is severe The value of the Namenda is questionable at best

## 2015-10-15 NOTE — Assessment & Plan Note (Signed)
10/15/15 she has minor rhonchi without increased work of breathing or subjective symptoms. Pulmonary toilet will be continued. The narcotic is relatively contraindicated because of the risk of respiratory depression

## 2015-10-15 NOTE — Patient Instructions (Addendum)
New orders :Vitamin D 25OH   Continue B12 monthly D/C Vicodin

## 2015-10-15 NOTE — Assessment & Plan Note (Signed)
Recheck vitamin D level to assess need for high-dose vitamin D 100,000 units a month

## 2015-10-15 NOTE — Progress Notes (Signed)
This is a nursing facility follow up for specific acute issue of  nausea and vomiting.  Interim medical record and care since last [facility] visit was updated with review of diagnostic studies and change in clinical status since last visit were documented.  HPI: Phenergan was requested for nausea w/o vomiting 10/14/2015. She has chronic constipation in the context of narcotic pain medication. She has orders for Ducolax, Metamucil, and milk of magnesia as needed. Last labs were 08/18/15. Hepatic enzymes were minimally decreased with an AST of 11 and ALT of 6. Total bilirubin was normal. Renal function was normal as well. Last hemoglobin and hematocrit were 12.5 and 38 on 07/24/15. She is on supplemental iron. Past history includes GERD and cholecystectomy. She is on Protonix daily.  Review of systems: Dementia invalidated responses. She struggled to give her date of birth;she was unable to give current date including the year. Reagan was named as the current president. Review of systems was attempted as noted and was completely negative with special emphasis on GI, GU symptomatology. She categorically denies having any pain despite being on both a narcotic and tramadol. She has a history of major depressive disorder: She denies any anxiety or depression at this time. She is on B12 injections monthly. Last B12 level on record was 221 on 06/08/11. She's also on high-dose vitamin D on no vitamin D level in Epic.   Constitutional: No fever,significant weight change, fatigue  Eyes: No redness, discharge, pain, vision change ENT/mouth: No nasal congestion,  purulent discharge, earache,change in hearing ,sore throat  Cardiovascular: No chest pain, palpitations,paroxysmal nocturnal dyspnea, claudication, edema  Respiratory: No cough, sputum production,hemoptysis, DOE , significant snoring,apnea   Gastrointestinal: No heartburn,dysphagia,abdominal pain, nausea / vomiting,rectal bleeding,  melena,change in bowels Genitourinary: No dysuria,hematuria, pyuria,  incontinence, nocturia Musculoskeletal: No joint stiffness, joint swelling, weakness,pain Dermatologic: No rash, pruritus, change in appearance of skin Neurologic: No dizziness,headache,syncope, seizures, numbness , tingling Psychiatric: No significant anxiety , depression, insomnia, anorexia Endocrine: No change in hair/skin/ nails, excessive thirst, excessive hunger, excessive urination  Hematologic/lymphatic: No significant bruising, lymphadenopathy,abnormal bleeding  Physical exam:  Pertinent or positive findings: She sits in a wheelchair without spontaneous interaction. She is very lethargic. She has minimal anisocoria, left pupil is minimally larger than the right. She wears only the upper denture. The mandible is sunken. She has scattered low-grade rhonchi slightly more prominent on the left than the right. There is no increased work of breathing. (Note: she denies any respiratory symptoms) Pedal pulses are decreased. She has insignificant trace edema. She has the anti-wandering bracelet on her ankle. She has mixed arthritic changes in the hands, most dominant in the PIP joints.  General appearance:Adequately nourished; no acute distress , increased work of breathing is present.   Lymphatic: No lymphadenopathy about the head, neck, axilla . Eyes: No conjunctival inflammation or lid edema is present. There is no scleral icterus. Ears:  External ear exam shows no significant lesions or deformities.   Nose:  External nasal examination shows no deformity or inflammation. Nasal mucosa are pink and moist without lesions ,exudates Oral exam: lips and gums are healthy appearing.There is no oropharyngeal erythema or exudate . Neck:  No thyromegaly, masses, tenderness noted.    Heart:  Normal rate and regular rhythm suggested clinically despite a history of atrial fib area minimal respiratory variation and rate. Note: Last  EKG 05/25/13 did show atrial fib with rate approximately 100. No gallop, murmur, click, rub .  Abdomen:Bowel sounds  are normal. Abdomen is soft and nontender with no organomegaly, hernias,masses. GU: deferred  Extremities:  No cyanosis, clubbing  Neurologic exam : Strength equal  in upper & lower extremities & surprisingly good Balance,Rhomberg,finger to nose testing could not be completed due to clinical state Deep tendon reflexes are equal Skin: Warm & dry with minimal tenting. No significant lesions or rash.    See summary under each active problem in the Problem List with associated updated therapeutic plan

## 2015-10-29 LAB — TSH: TSH: 1.19 u[IU]/mL (ref <=5.90)

## 2015-11-30 LAB — CBC AND DIFFERENTIAL
HCT: 39 % (ref 36–46)
Hemoglobin: 13.3 g/dL (ref 12.0–16.0)
PLATELETS: 319 10*3/uL (ref 150–399)
WBC: 6.3 10*3/mL

## 2015-11-30 LAB — BASIC METABOLIC PANEL
BUN: 13 mg/dL (ref 4–21)
CREATININE: 0.8 mg/dL (ref 0.5–1.1)
GLUCOSE: 95 mg/dL
Potassium: 4 mmol/L (ref 3.4–5.3)
Sodium: 144 mmol/L (ref 137–147)

## 2016-01-05 ENCOUNTER — Encounter: Payer: Self-pay | Admitting: Internal Medicine

## 2016-01-05 ENCOUNTER — Non-Acute Institutional Stay (SKILLED_NURSING_FACILITY): Payer: Medicare Other | Admitting: Internal Medicine

## 2016-01-05 DIAGNOSIS — D509 Iron deficiency anemia, unspecified: Secondary | ICD-10-CM | POA: Diagnosis not present

## 2016-01-05 DIAGNOSIS — J439 Emphysema, unspecified: Secondary | ICD-10-CM | POA: Diagnosis not present

## 2016-01-05 DIAGNOSIS — E538 Deficiency of other specified B group vitamins: Secondary | ICD-10-CM

## 2016-01-05 DIAGNOSIS — E559 Vitamin D deficiency, unspecified: Secondary | ICD-10-CM

## 2016-01-05 DIAGNOSIS — I10 Essential (primary) hypertension: Secondary | ICD-10-CM | POA: Diagnosis not present

## 2016-01-05 DIAGNOSIS — F0390 Unspecified dementia without behavioral disturbance: Secondary | ICD-10-CM | POA: Diagnosis not present

## 2016-01-05 DIAGNOSIS — I482 Chronic atrial fibrillation, unspecified: Secondary | ICD-10-CM

## 2016-01-05 NOTE — Progress Notes (Signed)
    Facility Location: [facility]  Room Number: 221-B  Code Status: DNR   This is a nursing facility follow up of chronic medical diagnoses  Interim medical record and care [facility] was updated with review of diagnostic studies and change in clinical status since last visit were documented.  HPI: The patient is pleasantly demented and voices no complaints. When I entered the room she was performing some physical therapy exercises with the aid of the therapist. She was able to stand up from the wheelchair using the bedrails for support and with some aid from the therapist.  She is on 50,000 international units of vitamin D twice a month. She is on monthly B12 injections and oral iron supplementation. The last B12 level on record was 494 on 08/18/15. Vitamin D level was checked 9/14 & was 46. Last routine lab studies were performed 11/30/15.BMET & CBC were normal. TSH was therapeutic @ 10/29/15.  Medical history includes hypertension, dementia, GERD, COPD, chronic kidney disease, and paroxysmal atrial flutter/ fib. Significant surgeries include hip arthroplasty and cholecystectomy.  Review of systems: Dementia invalidated responses. ROS negative as follows: Constitutional: No fever,significant weight change, fatigue  Cardiovascular: No chest pain, palpitations,paroxysmal nocturnal dyspnea, claudication, edema  Respiratory: No cough, sputum production,hemoptysis, DOE , significant snoring,apnea   Gastrointestinal: No heartburn,dysphagia,abdominal pain, nausea / vomiting,rectal bleeding, melena,change in bowels Genitourinary: No dysuria,hematuria, pyuria,  incontinence, nocturia Musculoskeletal: No joint stiffness, joint swelling, weakness,pain Dermatologic: No rash, pruritus, change in appearance of skin Neurologic: No dizziness,headache,syncope, seizures, numbness , tingling Psychiatric: No significant anxiety , depression, insomnia,  anorexia Hematologic/lymphatic: No significant bruising, lymphadenopathy,abnormal bleeding Allergy/immunology: No itchy/ watery eyes, significant sneezing, urticaria, angioedema  Physical exam:  Pertinent or positive findings: Hair is thin and fine. Arcus senilis is present. She has an upper plate. She's not wearing the lower plate. She has scattered low-grade rhonchi on expiration. Pedal pulses are decreased. She is wearing the anti-wandering bracelet on the right ankle. She has mixed arthritic change in the hands.  General appearance:Adequately nourished; no acute distress , increased work of breathing is present.   Lymphatic: No lymphadenopathy about the head, neck, axilla . Eyes: No conjunctival inflammation or lid edema is present. There is no scleral icterus. Ears:  External ear exam shows no significant lesions or deformities.   Nose:  External nasal examination shows no deformity or inflammation. Nasal mucosa are pink and moist without lesions ,exudates Oral exam: lips and gums are healthy appearing.There is no oropharyngeal erythema or exudate . Neck:  No thyromegaly, masses, tenderness noted.    Heart:  Normal rate and clinically regular rhythm. S1 and S2 normal without gallop, murmur, click, rub .  Abdomen:Bowel sounds are normal. Abdomen is soft and nontender with no organomegaly, hernias,masses. GU: deferred  Extremities:  No cyanosis, clubbing,edema  Neurologic exam : Strength equal  in upper & lower extremities Balance,Rhomberg,finger to nose testing could not be completed due to clinical state Deep tendon reflexes are equal Skin: Warm & dry w/o tenting. No significant lesions or rash.    See summary under each active problem in the Problem List with associated updated therapeutic plan

## 2016-01-05 NOTE — Assessment & Plan Note (Signed)
No behavioral issues present.

## 2016-01-05 NOTE — Assessment & Plan Note (Addendum)
Continuation of B12 monthly is clinically appropriate

## 2016-01-05 NOTE — Assessment & Plan Note (Addendum)
Ferritin will be checked to assess the need for ongoing iron supplementation

## 2016-01-05 NOTE — Assessment & Plan Note (Signed)
01/05/16 minimal rhonchi present without increased work of breathing. Continue present pulmonary toilet on as-needed basis.

## 2016-01-05 NOTE — Patient Instructions (Signed)
See Current Assessment & Plan in Problem List under specific Diagnosis 

## 2016-01-05 NOTE — Assessment & Plan Note (Addendum)
Vitamin D level was 46 on  10/15/15. No change indicated in dose

## 2016-01-05 NOTE — Assessment & Plan Note (Signed)
BP controlled; no change in antihypertensive medications  

## 2016-01-05 NOTE — Assessment & Plan Note (Signed)
01/05/16 rate is well controlled, clinically on exam rhythm appears essentially regular TSH is therapeutic

## 2016-01-06 ENCOUNTER — Encounter: Payer: Self-pay | Admitting: Internal Medicine

## 2016-04-18 ENCOUNTER — Encounter: Payer: Self-pay | Admitting: Internal Medicine

## 2016-04-18 ENCOUNTER — Non-Acute Institutional Stay (SKILLED_NURSING_FACILITY): Payer: Medicare Other | Admitting: Internal Medicine

## 2016-04-18 DIAGNOSIS — H1032 Unspecified acute conjunctivitis, left eye: Secondary | ICD-10-CM | POA: Diagnosis not present

## 2016-04-18 NOTE — Patient Instructions (Signed)
See assessment and plan under acute diagnosis for this visit 

## 2016-04-18 NOTE — Progress Notes (Signed)
    This is a nursing facility follow up for specific acute issue of discharge from OS.  Interim medical record and care [facility] was updated with review of diagnostic studies and change in clinical status since last visit were documented.  HPI: A change in condition form ascribes discharge of left eye beginning yesterday. The patient denies symptoms and is oblivious to this.  Review of systems: She denies any ophthalmologic or upper respiratory tract infection symptoms. Dementia invalidated responses.  Constitutional: No fever,significant weight change, fatigue  Eyes: No redness,  pain, vision change ENT/mouth: No nasal congestion,  purulent discharge, earache,change in hearing ,sore throat  Respiratory: No cough, sputum production,hemoptysis, DOE , significant snoring,apnea   Allergy/immunology: No itchy/ watery eyes, significant sneezing, urticaria, angioedema  Physical exam:  Pertinent or positive findings: She is pleasantly demented. There is crusting of the left eye medially. There is very minimal erythema of the conjunctiva of the lower sac She wears only the maxillary denture. Heart rhythm is slow and slightly irregular. DJD changes in the hands.  General appearance:Adequately nourished; no acute distress , increased work of breathing is present.   Lymphatic: No lymphadenopathy about the head, neck, axilla . Eyes:  There is no scleral icterus. Ears:  External ear exam shows no significant lesions or deformities.   Nose:  External nasal examination shows no deformity or inflammation. Nasal mucosa are pink and moist without lesions ,exudates Oral exam: lips and gums are healthy appearing.There is no oropharyngeal erythema or exudate . Neck:  No thyromegaly, masses, tenderness noted.    Lungs:Chest clear to auscultation without wheezes, rhonchi,rales , rubs. Skin: Warm & dry w/o tenting. No significant lesions or rash.  #1 mild  conjunctivitis Plan: erythromycin ophth ointment 4 times a day 7 days

## 2016-05-09 LAB — CBC AND DIFFERENTIAL
HCT: 38 % (ref 36–46)
Hemoglobin: 12.3 g/dL (ref 12.0–16.0)
PLATELETS: 312 10*3/uL (ref 150–399)
WBC: 5.9 10^3/mL

## 2016-05-09 LAB — BASIC METABOLIC PANEL
BUN: 16 mg/dL (ref 4–21)
Creatinine: 0.7 mg/dL (ref 0.5–1.1)
Glucose: 93 mg/dL
Potassium: 3.6 mmol/L (ref 3.4–5.3)
Sodium: 144 mmol/L (ref 137–147)

## 2016-06-07 ENCOUNTER — Non-Acute Institutional Stay (SKILLED_NURSING_FACILITY): Payer: Medicare Other | Admitting: Internal Medicine

## 2016-06-07 ENCOUNTER — Encounter: Payer: Self-pay | Admitting: Internal Medicine

## 2016-06-07 DIAGNOSIS — R112 Nausea with vomiting, unspecified: Secondary | ICD-10-CM | POA: Diagnosis not present

## 2016-06-07 DIAGNOSIS — I10 Essential (primary) hypertension: Secondary | ICD-10-CM

## 2016-06-07 NOTE — Progress Notes (Signed)
    Facility Location: [facility]  Room Number: 221 B  Code Status: DNR  This is a nursing facility follow up for specific acute issue of acute N& V today and of chronic medical diagnoses  Interim medical record and care since last [facility] visit was updated with review of diagnostic studies and change in clinical status since last visit were documented.  HPI: Staff questions reflux with acute nausea vomiting. Patient is on a PPI.  No constitutional, GI or infectious symptoms reported. Patient unable to provide history due to dementia. She actually denies having nausea or vomiting or any GI symptoms. She validates that she is " tired" ; she states "that is deadly". [place]. Most recent labs were approximately a month ago with normal renal function and blood count.  Past history is positive for hypertension, COPD with bronchospastic component, transient atrial fib/flutter, chronic kidney disease. The patient has had cholecystectomy. There is no mention of endoscopies.  Physical exam:  Pertinent or positive findings:She is pleasantly demented without orientation to date, place, or current events.  She wears only the upper plate. Tongue is moist. She has slight OS exotropia.  She has minimal rales diffusely. Pedal pulses are decreased. She has a anti-wandering bracelet on the right ankle. Trace edema is noted. Slight tenting of the skin is noted. Decreased range of motion of left upper extremity.  General appearance:Adequately nourished; no acute distress , increased work of breathing is present.   Lymphatic: No lymphadenopathy about the head, neck, axilla . Eyes: No conjunctival inflammation or lid edema is present. There is no scleral icterus. Ears:  External ear exam shows no significant lesions or deformities.   Nose:  External nasal examination shows no deformity or inflammation. Nasal mucosa are pink and  moist without lesions ,exudates Oral exam: lips and gums are healthy appearing.There is no oropharyngeal erythema or exudate . Neck:  No thyromegaly, masses, tenderness noted.    Heart:  Normal rate and regular rhythm. S1 and S2 normal without gallop, murmur, click, rub .  Abdomen:Bowel sounds are normal. Abdomen is soft and nontender with no organomegaly, hernias,masses. GU: deferred  Extremities:  No cyanosis, clubbing Neurologic exam : Cn 2-7 intact Strength equal  in upper extremities Balance,Rhomberg,finger to nose testing could not be completed due to clinical state Skin: Warm & dry No significant lesions or rash.  #1 nausea and vomiting, nonrepetitive  #2 severe dementia #3 HTN Plan: No additional studies will be performed unless the nausea & vomiting persist or progress Prn Zofran; continue PPI

## 2016-06-07 NOTE — Patient Instructions (Signed)
See assessment and plan under each diagnosis in the problem list and acutely for this visit 

## 2016-06-08 LAB — VITAMIN D 25 HYDROXY (VIT D DEFICIENCY, FRACTURES): VIT D 25 HYDROXY: 46.85

## 2016-06-09 ENCOUNTER — Encounter: Payer: Self-pay | Admitting: *Deleted

## 2016-06-14 ENCOUNTER — Encounter: Payer: Self-pay | Admitting: Internal Medicine

## 2016-06-14 ENCOUNTER — Non-Acute Institutional Stay (SKILLED_NURSING_FACILITY): Payer: Medicare Other | Admitting: Internal Medicine

## 2016-06-14 DIAGNOSIS — H1032 Unspecified acute conjunctivitis, left eye: Secondary | ICD-10-CM

## 2016-06-14 NOTE — Progress Notes (Signed)
    MRN: [id number]  Facility Location: Room Number:  Code Status: DNR  This is a nursing facility follow up for specific acute issue of left eye conjunctivitis.  Interim medical record and care since [facility] was updated with review of diagnostic studies and change in clinical status since last visit were documented.  HPI: The patient is completely unaware of the acute eye problem which is manifested as redness of the conjunctiva and purulent drainage. She denies any infectious symptoms as well as any ophthalmologic symptoms. This is in the context of her profound, advanced dementia.  Review of systems: Constitutional: No fever,significant weight change, fatigue  Eyes: denies redness, discharge, pain, vision change ENT/mouth: No nasal congestion,  purulent discharge, earache,change in hearing ,sore throat  Cardiovascular: No chest pain, palpitations,paroxysmal nocturnal dyspnea, claudication, edema  Respiratory: No cough, sputum production,hemoptysis, DOE , significant snoring,apnea   Dermatologic: No rash, pruritus, change in appearance of skin Allergy/immunology: No itchy/ watery eyes, significant sneezing, urticaria, angioedema  Physical exam:  Pertinent or positive findings: There is marked erythema of the conjunctiva of the left eye with purulent, matted drainage medially. She has no lymphadenopathy about the head or neck. Incorrectly identifies # of fingers with either eye. EOM grossly intact.  General appearance:Adequately nourished; no acute distress , increased work of breathing is present.   Lymphatic: No lymphadenopathy about the head, neck, axilla . Eyes: No  lid edema is present. There is no scleral icterus. Ears:  External ear exam shows no significant lesions or deformities.   Nose:  External nasal examination shows no deformity or inflammation. Nasal mucosa are pink and moist without lesions ,exudates Oral exam: lips and gums are healthy  appearing.There is no oropharyngeal erythema or exudate . Neck:  No thyromegaly, masses, tenderness noted.    Heart:  Normal rate and regular rhythm. S1 and S2 normal without gallop, murmur, click, rub .  Lungs:Chest clear to auscultation without wheezes, rhonchi,rales , rubs. Skin: Warm & dry w/o tenting. No significant lesions or rash.  #1 acute conjunctivitis Plan: Erythromycin ophthalmic ointment 4 times a day for 10 days

## 2016-06-14 NOTE — Patient Instructions (Signed)
See assessment and plan under each diagnosis acutely for this visit  

## 2016-07-06 DIAGNOSIS — B9620 Unspecified Escherichia coli [E. coli] as the cause of diseases classified elsewhere: Secondary | ICD-10-CM

## 2016-07-06 DIAGNOSIS — N390 Urinary tract infection, site not specified: Secondary | ICD-10-CM

## 2016-07-06 HISTORY — DX: Urinary tract infection, site not specified: N39.0

## 2016-07-06 HISTORY — DX: Unspecified Escherichia coli (E. coli) as the cause of diseases classified elsewhere: B96.20

## 2016-07-06 LAB — CBC AND DIFFERENTIAL
HCT: 39 (ref 36–46)
Hemoglobin: 12.6 (ref 12.0–16.0)
NEUTROS ABS: 4
Platelets: 297 (ref 150–399)
WBC: 6

## 2016-07-06 LAB — BASIC METABOLIC PANEL
BUN: 20 (ref 4–21)
Creatinine: 0.7 (ref 0.5–1.1)
GLUCOSE: 107
POTASSIUM: 3.6 (ref 3.4–5.3)
Sodium: 147 (ref 137–147)

## 2016-07-14 ENCOUNTER — Encounter: Payer: Self-pay | Admitting: Internal Medicine

## 2016-08-25 ENCOUNTER — Encounter: Payer: Self-pay | Admitting: Internal Medicine

## 2016-08-25 NOTE — Progress Notes (Signed)
    NURSING HOME LOCATION:  [facility] ROOM NUMBER:  221-B  CODE STATUS:  DNR  PCP:  Hopper, William F, MD  [address] [place] [address]   This is a nursing facility follow up for specific acute issue of  of chronic medical diagnoses  Nursing Facility readmission within 30 days  Interim medical record and care since last [facility] visit was updated with review of diagnostic studies and change in clinical status since last visit were documented.  HPI:  Review of systems: Dementia invalidated responses. Date given as   Constitutional: No fever,significant weight change, fatigue  Eyes: No redness, discharge, pain, vision change ENT/mouth: No nasal congestion,  purulent discharge, earache,change in hearing ,sore throat  Cardiovascular: No chest pain, palpitations,paroxysmal nocturnal dyspnea, claudication, edema  Respiratory: No cough, sputum production,hemoptysis, DOE , significant snoring,apnea  Gastrointestinal: No heartburn,dysphagia,abdominal pain, nausea / vomiting,rectal bleeding, melena,change in bowels Genitourinary: No dysuria,hematuria, pyuria,  incontinence, nocturia Musculoskeletal: No joint stiffness, joint swelling, weakness,pain Dermatologic: No rash, pruritus, change in appearance of skin Neurologic: No dizziness,headache,syncope, seizures, numbness , tingling Psychiatric: No significant anxiety , depression, insomnia, anorexia Endocrine: No change in hair/skin/ nails, excessive thirst, excessive hunger, excessive urination  Hematologic/lymphatic: No significant bruising, lymphadenopathy,abnormal bleeding Allergy/immunology: No itchy/ watery eyes, significant sneezing, urticaria, angioedema  Physical exam:  Pertinent or positive findings: General appearance:Adequately nourished; no acute distress , increased work of breathing is present.   Lymphatic: No lymphadenopathy about the head, neck, axilla . Eyes: No conjunctival inflammation or lid  edema is present. There is no scleral icterus. Ears:  External ear exam shows no significant lesions or deformities.   Nose:  External nasal examination shows no deformity or inflammation. Nasal mucosa are pink and moist without lesions ,exudates Oral exam: lips and gums are healthy appearing.There is no oropharyngeal erythema or exudate . Neck:  No thyromegaly, masses, tenderness noted.    Heart:  Normal rate and regular rhythm. S1 and S2 normal without gallop, murmur, click, rub .  Lungs:Chest clear to auscultation without wheezes, rhonchi,rales , rubs. Abdomen:Bowel sounds are normal. Abdomen is soft and nontender with no organomegaly, hernias,masses. GU: deferred  Extremities:  No cyanosis, clubbing,edema  Neurologic exam : Cn 2-7 intact Strength equal  in upper & lower extremities Balance,Rhomberg,finger to nose testing could not be completed due to clinical state Deep tendon reflexes are equal Skin: Warm & dry w/o tenting. No significant lesions or rash.  See summary under each active problem in the Problem List with associated updated therapeutic plan     This encounter was created in error - please disregard. 

## 2016-09-22 ENCOUNTER — Non-Acute Institutional Stay (SKILLED_NURSING_FACILITY): Payer: Medicare Other

## 2016-09-22 DIAGNOSIS — Z0000 Encounter for general adult medical examination without abnormal findings: Secondary | ICD-10-CM

## 2016-09-22 NOTE — Progress Notes (Signed)
Subjective:   [name] [name] is a 83 y.o. female who presents for an Initial Medicare Annual Wellness Visit at [facility]    Objective:    Today's Vitals   09/22/16 1409  BP: 125/80  Pulse: (!) 48  Temp: (!) 97.4 F (36.3 C)  TempSrc: Oral  SpO2: 95%  Weight: 159 lb (72.1 kg)  Height: 5\' 4"  (1.626 m)   Body mass index is 27.29 kg/m.   Current Medications (verified) Outpatient Encounter Prescriptions as of 09/22/2016  Medication Sig  . acetaminophen (TYLENOL ARTHRITIS PAIN) 650 MG CR tablet Take 650 mg by mouth 2 (two) times daily.  . AMBULATORY NON FORMULARY MEDICATION NSA Med Pass: Give 120 ml twice daily for supplement.  Marland Kitchen ascorbic acid (VITAMIN C) 500 MG tablet Take 500 mg by mouth 2 (two) times daily.  . bisacodyl (DULCOLAX) 10 MG suppository Give 10mg  rectally x 1 dose in 24 hours as needed for constipation if not relieved by MOM  . Cholecalciferol 50000 units capsule Give 1 capsule on the 12th and the 28th  of every month for supplement.  . cyanocobalamin (,VITAMIN B-12,) 1000 MCG/ML injection Inject 1,000 mcg into the muscle every 30 (thirty) days.  Marland Kitchen diltiazem (DILACOR XR) 180 MG 24 hr capsule Take 180 mg by mouth daily. Check BP and pulse and hold if HR<55 and/or SBP<100  . ferrous sulfate 325 (65 FE) MG tablet Take 325 mg by mouth 2 (two) times daily.   . fluticasone (FLONASE) 50 MCG/ACT nasal spray Place 1 spray into the nose daily.   Marland Kitchen guaifenesin (ROBITUSSIN) 100 MG/5ML syrup Take 200 mg by mouth every 4 (four) hours as needed for cough.  . hydrochlorothiazide (MICROZIDE) 12.5 MG capsule Take 12.5 mg by mouth daily.  . magnesium hydroxide (MILK OF MAGNESIA) 400 MG/5ML suspension Take 30 mLs by mouth daily as needed for mild constipation.  . memantine (NAMENDA) 10 MG tablet Take 10 mg by mouth 2 (two) times daily.  . mirtazapine (REMERON) 15 MG tablet Take 15 mg by mouth at bedtime. For depression /appetite suppressant  . Multiple Vitamins-Minerals  (PRESERVISION AREDS 2) CAPS Take 1 tablet by mouth 2 (two) times daily. For Macular Degeneration  . Nutritional Supplements (ENSURE CLEAR PO) Take one daily for poor appetite  . ondansetron (ZOFRAN) 4 MG tablet Take 4 mg by mouth every 6 (six) hours as needed for nausea or vomiting.  . pantoprazole (PROTONIX) 20 MG tablet Take 20 mg by mouth daily before breakfast.  . PARoxetine (PAXIL) 10 MG tablet Take 10 mg by mouth daily.  . polyethylene glycol (MIRALAX / GLYCOLAX) packet Take 17 g by mouth daily.  . traMADol (ULTRAM) 50 MG tablet Take 50 mg by mouth every 12 (twelve) hours as needed for moderate pain.  Marland Kitchen UNABLE TO FIND Med Name: Magic Cup daily with lunch  . [DISCONTINUED] arformoterol (BROVANA) 15 MCG/2ML NEBU Take 15 mcg by nebulization 2 (two) times daily.  . [DISCONTINUED] ipratropium-albuterol (DUONEB) 0.5-2.5 (3) MG/3ML SOLN Give by nebulization every 4 hours as needed for wheezing  . [DISCONTINUED] Multiple Vitamin (MULTIVITAMIN) tablet Take 1 tablet by mouth daily.  . [DISCONTINUED] psyllium (METAMUCIL) 58.6 % packet Take 1 packet by mouth daily.   No facility-administered encounter medications on file as of 09/22/2016.     Allergies (verified) Patient has no known allergies.   History: Past Medical History:  Diagnosis Date  . Atrial fib/flutter, transient   . Chronic kidney disease   . COPD (  chronic obstructive pulmonary disease) (HCC)   . E. coli UTI (urinary tract infection) 07/06/2016   pan sensitive; Cipro X 5 days  . GERD (gastroesophageal reflux disease)   . Hypertension    Past Surgical History:  Procedure Laterality Date  . CHOLECYSTECTOMY    . HIP ARTHROPLASTY Right 05/24/2013   Procedure: RIGHT HIP HEMIARTHROPLASTY;  Surgeon: Kathryne Hitch, MD;  Location: [facility] OR;  Service: Orthopedics;  Laterality: Right;  . NASAL SINUS SURGERY     History reviewed. No pertinent family history. Social History   Occupational History  . Not on file.   Social  History Main Topics  . Smoking status: Never Smoker  . Smokeless tobacco: Never Used  . Alcohol use No  . Drug use: No  . Sexual activity: No    Tobacco Counseling Counseling given: Not Answered   Activities of Daily Living In your present state of health, do you have any difficulty performing the following activities: 09/22/2016  Hearing? N  Vision? N  Difficulty concentrating or making decisions? Y  Walking or climbing stairs? Y  Dressing or bathing? Y  Doing errands, shopping? Y  Preparing Food and eating ? Y  Using the Toilet? Y  In the past six months, have you accidently leaked urine? Y  Do you have problems with loss of bowel control? Y  Managing your Medications? Y  Managing your Finances? Y  Housekeeping or managing your Housekeeping? Y  Some recent data might be hidden    Immunizations and Health Maintenance Immunization History  Administered Date(s) Administered  . Influenza-Unspecified 11/01/2011, 11/06/2013, 11/12/2014, 10/29/2015  . Pneumococcal-Unspecified 08/17/2008  . Tdap 03/24/2011   Health Maintenance Due  Topic Date Due  . INFLUENZA VACCINE  08/31/2016    Patient Care Team: Pecola Lawless, MD as PCP - General (Internal Medicine) Sharon Seller, NP as Nurse Practitioner (Nurse Practitioner)  Indicate any recent Medical Services you may have received from other than [facility] providers in the past year (date may be approximate).     Assessment:   This is a routine wellness examination for [name] [name].   Hearing/Vision screen No exam data present  Dietary issues and exercise activities discussed: Current Exercise Habits: The patient does not participate in regular exercise at present, Exercise limited by: orthopedic condition(s);neurologic condition(s)  Goals    None     Depression Screen PHQ 2/9 Scores 09/22/2016  PHQ - 2 Score 0    Fall Risk Fall Risk  09/22/2016  Falls in the past year? No    Cognitive Function:     6CIT Screen  09/22/2016  What Year? 4 points  What month? 3 points  What time? 3 points  Count back from 20 4 points  Months in reverse 4 points  Repeat phrase 10 points  Total Score 28    Screening Tests Health Maintenance  Topic Date Due  . INFLUENZA VACCINE  08/31/2016  . DEXA SCAN  02/01/2023 (Originally 08/08/1993)  . TETANUS/TDAP  03/23/2021  . PNA vac Low Risk Adult  Completed      Plan:  I have personally reviewed and addressed the Medicare Annual Wellness questionnaire and have noted the following in the patient's chart:  A. Medical and social history B. Use of alcohol, tobacco or illicit drugs  C. Current medications and supplements D. Functional ability and status E.  Nutritional status F.  Physical activity G. Advance directives H. List of other physicians I.  Hospitalizations, surgeries, and ER visits in previous 12  months J.  Vitals K. Screenings to include hearing, vision, cognitive, depression L. Referrals and appointments - none  In addition, I have reviewed and discussed with patient certain preventive protocols, quality metrics, and best practice recommendations. A written personalized care plan for preventive services as well as general preventive health recommendations were provided to patient.  See attached scanned questionnaire for additional information.   Signed,   Annetta Maw, RN Nurse Health Advisor   Quick Notes   Health Maintenance: TDAP, DEXA due. dexa not ordered, pt nonambulatory     Abnormal Screen: 6 Cit-28     Patient Concerns: None     Nurse Concerns: None  I have personally reviewed the health advisor's clinical note, was available for consultation, and agree with the assessment and plan as written. Mental status exam findings will be compared to SLUMS/BIMS results. Pecola Lawless M.D., FACP, [facility]

## 2016-09-22 NOTE — Patient Instructions (Signed)
[name] , Thank you for taking time to come for your Medicare Wellness Visit. I appreciate your ongoing commitment to your health goals. Please review the following plan we discussed and let me know if I can assist you in the future.   Screening recommendations/referrals: Colonoscopy excluded, pt over age 81 Mammogram excluded, pt over age 75 Bone Density due, excluded pt nonambulatory Recommended yearly ophthalmology/optometry visit for glaucoma screening and checkup Recommended yearly dental visit for hygiene and checkup  Vaccinations: Influenza vaccine due 2018 flu season Pneumococcal vaccine up to date Tdap vaccine due, ordered Shingles vaccine not in records  Advanced directives: DNR in chart, copies of health care power of attorney and living will are needed   Conditions/risks identified: None  Next appointment: Dr. Alwyn Ren makes rounds   Preventive Care 65 Years and Older, Female Preventive care refers to lifestyle choices and visits with your health care provider that can promote health and wellness. What does preventive care include?  A yearly physical exam. This is also called an annual well check.  Dental exams once or twice a year.  Routine eye exams. Ask your health care provider how often you should have your eyes checked.  Personal lifestyle choices, including:  Daily care of your teeth and gums.  Regular physical activity.  Eating a healthy diet.  Avoiding tobacco and drug use.  Limiting alcohol use.  Practicing safe sex.  Taking low-dose aspirin every day.  Taking vitamin and mineral supplements as recommended by your health care provider. What happens during an annual well check? The services and screenings done by your health care provider during your annual well check will depend on your age, overall health, lifestyle risk factors, and family history of disease. Counseling  Your health care provider may ask you questions about your:  Alcohol  use.  Tobacco use.  Drug use.  Emotional well-being.  Home and relationship well-being.  Sexual activity.  Eating habits.  History of falls.  Memory and ability to understand (cognition).  Work and work Astronomer.  Reproductive health. Screening  You may have the following tests or measurements:  Height, weight, and BMI.  Blood pressure.  Lipid and cholesterol levels. These may be checked every 5 years, or more frequently if you are over 45 years old.  Skin check.  Lung cancer screening. You may have this screening every year starting at age 10 if you have a 30-pack-year history of smoking and currently smoke or have quit within the past 15 years.  Fecal occult blood test (FOBT) of the stool. You may have this test every year starting at age 28.  Flexible sigmoidoscopy or colonoscopy. You may have a sigmoidoscopy every 5 years or a colonoscopy every 10 years starting at age 39.  Hepatitis C blood test.  Hepatitis B blood test.  Sexually transmitted disease (STD) testing.  Diabetes screening. This is done by checking your blood sugar (glucose) after you have not eaten for a while (fasting). You may have this done every 1-3 years.  Bone density scan. This is done to screen for osteoporosis. You may have this done starting at age 15.  Mammogram. This may be done every 1-2 years. Talk to your health care provider about how often you should have regular mammograms. Talk with your health care provider about your test results, treatment options, and if necessary, the need for more tests. Vaccines  Your health care provider may recommend certain vaccines, such as:  Influenza vaccine. This is recommended every year.  Tetanus, diphtheria, and acellular pertussis (Tdap, Td) vaccine. You may need a Td booster every 10 years.  Zoster vaccine. You may need this after age 11.  Pneumococcal 13-valent conjugate (PCV13) vaccine. One dose is recommended after age  6.  Pneumococcal polysaccharide (PPSV23) vaccine. One dose is recommended after age 69. Talk to your health care provider about which screenings and vaccines you need and how often you need them. This information is not intended to replace advice given to you by your health care provider. Make sure you discuss any questions you have with your health care provider. Document Released: 02/13/2015 Document Revised: 10/07/2015 Document Reviewed: 11/18/2014 Elsevier Interactive Patient Education  2017 [place] Prevention in the Home Falls can cause injuries. They can happen to people of all ages. There are many things you can do to make your home safe and to help prevent falls. What can I do on the outside of my home?  Regularly fix the edges of walkways and driveways and fix any cracks.  Remove anything that might make you trip as you walk through a door, such as a raised step or threshold.  Trim any bushes or trees on the path to your home.  Use bright outdoor lighting.  Clear any walking paths of anything that might make someone trip, such as rocks or tools.  Regularly check to see if handrails are loose or broken. Make sure that both sides of any steps have handrails.  Any raised decks and porches should have guardrails on the edges.  Have any leaves, snow, or ice cleared regularly.  Use sand or salt on walking paths during winter.  Clean up any spills in your garage right away. This includes oil or grease spills. What can I do in the bathroom?  Use night lights.  Install grab bars by the toilet and in the tub and shower. Do not use towel bars as grab bars.  Use non-skid mats or decals in the tub or shower.  If you need to sit down in the shower, use a plastic, non-slip stool.  Keep the floor dry. Clean up any water that spills on the floor as soon as it happens.  Remove soap buildup in the tub or shower regularly.  Attach bath mats securely with double-sided  non-slip rug tape.  Do not have throw rugs and other things on the floor that can make you trip. What can I do in the bedroom?  Use night lights.  Make sure that you have a light by your bed that is easy to reach.  Do not use any sheets or blankets that are too big for your bed. They should not hang down onto the floor.  Have a firm chair that has side arms. You can use this for support while you get dressed.  Do not have throw rugs and other things on the floor that can make you trip. What can I do in the kitchen?  Clean up any spills right away.  Avoid walking on wet floors.  Keep items that you use a lot in easy-to-reach places.  If you need to reach something above you, use a strong step stool that has a grab bar.  Keep electrical cords out of the way.  Do not use floor polish or wax that makes floors slippery. If you must use wax, use non-skid floor wax.  Do not have throw rugs and other things on the floor that can make you trip. What can I do  with my stairs?  Do not leave any items on the stairs.  Make sure that there are handrails on both sides of the stairs and use them. Fix handrails that are broken or loose. Make sure that handrails are as long as the stairways.  Check any carpeting to make sure that it is firmly attached to the stairs. Fix any carpet that is loose or worn.  Avoid having throw rugs at the top or bottom of the stairs. If you do have throw rugs, attach them to the floor with carpet tape.  Make sure that you have a light switch at the top of the stairs and the bottom of the stairs. If you do not have them, ask someone to add them for you. What else can I do to help prevent falls?  Wear shoes that:  Do not have high heels.  Have rubber bottoms.  Are comfortable and fit you well.  Are closed at the toe. Do not wear sandals.  If you use a stepladder:  Make sure that it is fully opened. Do not climb a closed stepladder.  Make sure that both  sides of the stepladder are locked into place.  Ask someone to hold it for you, if possible.  Clearly mark and make sure that you can see:  Any grab bars or handrails.  First and last steps.  Where the edge of each step is.  Use tools that help you move around (mobility aids) if they are needed. These include:  Canes.  Walkers.  Scooters.  Crutches.  Turn on the lights when you go into a dark area. Replace any light bulbs as soon as they burn out.  Set up your furniture so you have a clear path. Avoid moving your furniture around.  If any of your floors are uneven, fix them.  If there are any pets around you, be aware of where they are.  Review your medicines with your doctor. Some medicines can make you feel dizzy. This can increase your chance of falling. Ask your doctor what other things that you can do to help prevent falls. This information is not intended to replace advice given to you by your health care provider. Make sure you discuss any questions you have with your health care provider. Document Released: 11/13/2008 Document Revised: 06/25/2015 Document Reviewed: 02/21/2014 Elsevier Interactive Patient Education  2017 [employer].

## 2016-09-28 ENCOUNTER — Encounter: Payer: Self-pay | Admitting: Internal Medicine

## 2016-09-28 NOTE — Progress Notes (Signed)
    NURSING HOME LOCATION:  [facility] ROOM NUMBER:  [id number]  CODE STATUS:  DNR  PCP:  Pecola LawlessHopper, William F, MD  [address] [place] [id number]   This is a nursing facility follow up for specific acute issue of  of chronic medical diagnoses  Nursing Facility readmission within 30 days  Interim medical record and care [facility] was updated with review of diagnostic studies and change in clinical status since last visit were documented.  HPI:  Review of systems: Dementia invalidated responses. Date given as   Constitutional: No fever,significant weight change, fatigue  Eyes: No redness, discharge, pain, vision change ENT/mouth: No nasal congestion,  purulent discharge, earache,change in hearing ,sore throat  Cardiovascular: No chest pain, palpitations,paroxysmal nocturnal dyspnea, claudication, edema  Respiratory: No cough, sputum production,hemoptysis, DOE , significant snoring,apnea  Gastrointestinal: No heartburn,dysphagia,abdominal pain, nausea / vomiting,rectal bleeding, melena,change in bowels Genitourinary: No dysuria,hematuria, pyuria,  incontinence, nocturia Musculoskeletal: No joint stiffness, joint swelling, weakness,pain Dermatologic: No rash, pruritus, change in appearance of skin Neurologic: No dizziness,headache,syncope, seizures, numbness , tingling Psychiatric: No significant anxiety , depression, insomnia, anorexia Endocrine: No change in hair/skin/ nails, excessive thirst, excessive hunger, excessive urination  Hematologic/lymphatic: No significant bruising, lymphadenopathy,abnormal bleeding Allergy/immunology: No itchy/ watery eyes, significant sneezing, urticaria, angioedema  Physical exam:  Pertinent or positive findings: General appearance:Adequately nourished; no acute distress , increased work of breathing is present.   Lymphatic: No lymphadenopathy about the head, neck, axilla . Eyes: No conjunctival inflammation or lid  edema is present. There is no scleral icterus. Ears:  External ear exam shows no significant lesions or deformities.   Nose:  External nasal examination shows no deformity or inflammation. Nasal mucosa are pink and moist without lesions ,exudates Oral exam: lips and gums are healthy appearing.There is no oropharyngeal erythema or exudate . Neck:  No thyromegaly, masses, tenderness noted.    Heart:  Normal rate and regular rhythm. S1 and S2 normal without gallop, murmur, click, rub .  Lungs:Chest clear to auscultation without wheezes, rhonchi,rales , rubs. Abdomen:Bowel sounds are normal. Abdomen is soft and nontender with no organomegaly, hernias,masses. GU: deferred  Extremities:  No cyanosis, clubbing,edema  Neurologic exam : Cn 2-7 intact Strength equal  in upper & lower extremities Balance,Rhomberg,finger to nose testing could not be completed due to clinical state Deep tendon reflexes are equal Skin: Warm & dry w/o tenting. No significant lesions or rash.  See summary under each active problem in the Problem List with associated updated therapeutic plan     This encounter was created in error - please disregard.

## 2016-12-29 ENCOUNTER — Non-Acute Institutional Stay (SKILLED_NURSING_FACILITY): Payer: Medicare Other | Admitting: Internal Medicine

## 2016-12-29 ENCOUNTER — Encounter: Payer: Self-pay | Admitting: Internal Medicine

## 2016-12-29 DIAGNOSIS — D509 Iron deficiency anemia, unspecified: Secondary | ICD-10-CM

## 2016-12-29 DIAGNOSIS — I482 Chronic atrial fibrillation, unspecified: Secondary | ICD-10-CM

## 2016-12-29 DIAGNOSIS — F0390 Unspecified dementia without behavioral disturbance: Secondary | ICD-10-CM | POA: Diagnosis not present

## 2016-12-29 DIAGNOSIS — E538 Deficiency of other specified B group vitamins: Secondary | ICD-10-CM

## 2016-12-29 NOTE — Assessment & Plan Note (Signed)
Check iron panel to assess need for continuing iron; if values are normal or therapeutic, decrease or DC the iron as well as a vitamin C.

## 2016-12-29 NOTE — Assessment & Plan Note (Signed)
Namenda unlikely of benefit, but it will be continued as she is stable

## 2016-12-29 NOTE — Patient Instructions (Signed)
See assessment and plan under each diagnosis in the problem list and acutely for this visit 

## 2016-12-29 NOTE — Progress Notes (Signed)
    NURSING HOME LOCATION:  [facility] ROOM NUMBER:  [id number]  CODE STATUS:  DNR  PCP:  Pecola LawlessHopper, Lavarius Doughten F, MD  [address] [place] [id number]   This is a nursing facility follow up of chronic medical diagnoses  Interim medical record and care [facility] was updated with review of diagnostic studies and change in clinical status since last visit were documented.  HPI: The patient is a permanent resident at this facility with diagnoses of chronic atrial fibrillation, essential hypertension, chronic kidney disease stage II, COPD with chronic respiratory failure, GERD with dysphagia, dementia without behavioral disturbance, B12 deficiency, vitamin D deficiency, and chronic anemia. The patient receives B12 injections monthly. She is also on iron supplement twice a day. This is given with vitamin C to enhance its absorption. Labs are not current, but anemia had resolved as of 07/06/16. In May vitamin D level was normal. With the B12 injections ;the last B12 level on record was 494 on 08/18/15. The value was 221 in May 2013.  Review of systems: She denies any active symptoms. Severe dementia invalidated responses. When I asked to examine her mouth she said"no" but opened her mouth for exam. When asked to check her thyroid she again declined but then allowed the exam. When asked if she wanted to stay in her room after exam she said "yes" and then proceeded to use her feet to propel the wheelchair out of the room.  Physical exam:  Pertinent or positive findings: She stares blankly at the interviewer. She wears her upper plate only. Clinically the rhythm is only minimally irregular.  There is intermittent flow murmur. She had minor upper chest expiratory rhonchi that this may have been related to closing her mouth with exhalation. Pedal pulses are decreased. She has significant mixed DIP/PIP arthritic changes in the hands. She is surprisingly strong to opposition in the upper  lower extremities.  General appearance:Adequately nourished; no acute distress , increased work of breathing is present.   Lymphatic: No lymphadenopathy about the head, neck, axilla . Eyes: No conjunctival inflammation or lid edema is present. There is no scleral icterus. Ears:  External ear exam shows no significant lesions or deformities.   Nose:  External nasal examination shows no deformity or inflammation. Nasal mucosa are pink and moist without lesions ,exudates Oral exam: lips and gums are healthy appearing.There is no oropharyngeal erythema or exudate . Neck:  No thyromegaly, masses, tenderness noted.    Heart:  No gallop, murmur, click, rub .  Lungs: without wheezes, rales , rubs. Abdomen:Bowel sounds are normal. Abdomen is soft and nontender with no organomegaly, hernias,masses. GU: deferred  Extremities:  No cyanosis, clubbing,edema  Neurologic exam : Balance,Rhomberg,finger to nose testing could not be completed due to clinical state Skin: Warm & dry w/o tenting. No significant lesions or rash.  See summary under each active problem in the Problem List with associated updated therapeutic plan

## 2016-12-29 NOTE — Assessment & Plan Note (Signed)
Recheck B12 level 

## 2016-12-29 NOTE — Assessment & Plan Note (Addendum)
12/29/16 clinically rhythm is only slightly irregular. If A. fib is present, the rate is well controlled. TSH will be checked

## 2017-02-10 LAB — HEPATIC FUNCTION PANEL
ALT: 9 (ref 7–35)
AST: 11 — AB (ref 13–35)
Alkaline Phosphatase: 83 (ref 25–125)
BILIRUBIN, TOTAL: 0.4

## 2017-02-10 LAB — BASIC METABOLIC PANEL
BUN: 13 (ref 4–21)
CREATININE: 0.7 (ref 0.5–1.1)
GLUCOSE: 92
Potassium: 3.8 (ref 3.4–5.3)
Sodium: 143 (ref 137–147)

## 2017-02-10 LAB — TSH: TSH: 1.61 (ref 0.41–5.90)

## 2017-02-10 LAB — CBC AND DIFFERENTIAL
HCT: 35 — AB (ref 36–46)
Hemoglobin: 12 (ref 12.0–16.0)
NEUTROS ABS: 3
Platelets: 248 (ref 150–399)
WBC: 6

## 2017-02-10 LAB — VITAMIN D 25 HYDROXY (VIT D DEFICIENCY, FRACTURES): Vit D, 25-Hydroxy: 47.05

## 2017-02-10 LAB — VITAMIN B12: VITAMIN B 12: 702

## 2017-04-20 ENCOUNTER — Non-Acute Institutional Stay (SKILLED_NURSING_FACILITY): Payer: Medicare Other | Admitting: Internal Medicine

## 2017-04-20 ENCOUNTER — Encounter: Payer: Self-pay | Admitting: Internal Medicine

## 2017-04-20 DIAGNOSIS — I482 Chronic atrial fibrillation, unspecified: Secondary | ICD-10-CM

## 2017-04-20 DIAGNOSIS — E538 Deficiency of other specified B group vitamins: Secondary | ICD-10-CM

## 2017-04-20 DIAGNOSIS — J439 Emphysema, unspecified: Secondary | ICD-10-CM | POA: Diagnosis not present

## 2017-04-20 DIAGNOSIS — I10 Essential (primary) hypertension: Secondary | ICD-10-CM | POA: Diagnosis not present

## 2017-04-20 DIAGNOSIS — D509 Iron deficiency anemia, unspecified: Secondary | ICD-10-CM

## 2017-04-20 NOTE — Assessment & Plan Note (Signed)
B12 level therapeutic, no change in present supplement 

## 2017-04-20 NOTE — Progress Notes (Signed)
    NURSING HOME LOCATION:  [facility] ROOM NUMBER:  [id number]  CODE STATUS:  DNR  PCP:  Pecola LawlessHopper, Bocephus Cali F, MD  [address] [place] [id number]  This is a nursing facility follow up of chronic medical diagnoses.  Interim medical record and care [facility] was updated with review of diagnostic studies and change in clinical status since last visit were documented.  HPI: The patient is a permanent resident of this facility with medical diagnoses of chronic A. fib, essential hypertension, CKD stage II, COPD with chronic respiratory insufficiency, GERD with dysphagia, advanced dementia without behavioral disturbance, chronic anemia associated with B12 deficiency, and vitamin D deficiency also. For the anemia she receives B12 injections monthly.She is on iron twice a day. Labs 02/10/17 revealed minimal anemia with hematocrit 35. Both B12 and vitamin D levels were normal. Chemistries were normal except for slightly reduced AST of 11.  Review of systems: Dementia prevented completion. She has no active complaints. Her only concern was why her son had married "that wife".  Physical exam:  Pertinent or positive findings: She babbles nonsensically without focus. She wears  only the upper plate. She has a grade 1 systolic murmur. Heart rhythm is irregular.Low grade rales & rhonchi without increased work of breathing present. Abdomen is protuberant. Pedal pulses are decreased. She is wearing anti-wandering bracelet.  General appearance: Adequately nourished; no acute distress, increased work of breathing is present.   Lymphatic: No lymphadenopathy about the head, neck, axilla. Eyes: No conjunctival inflammation or lid edema is present. There is no scleral icterus. Ears:  External ear exam shows no significant lesions or deformities.   Nose:  External nasal examination shows no deformity or inflammation. Nasal mucosa are pink and moist without lesions, exudates Oral exam:   Lips and gums are healthy appearing. There is no oropharyngeal erythema or exudate. Neck:  No thyromegaly, masses, tenderness noted.    Heart:  No gallop, click, rub .  Lungs: without wheezes, rubs. Abdomen:Bowel sounds are normal. Abdomen is soft and nontender with no organomegaly, hernias,masses. GU: deferred  Extremities:  No cyanosis, clubbing,edema  Neurologic exam : Skin: Warm & dry w/o tenting. No significant lesions or rash.  See summary under each active problem in the Problem List with associated updated therapeutic plan

## 2017-04-20 NOTE — Assessment & Plan Note (Deleted)
B12 level therapeutic, no change in present supplement

## 2017-04-20 NOTE — Assessment & Plan Note (Signed)
No evidence of progressive anemia, no change in present therapy

## 2017-04-20 NOTE — Assessment & Plan Note (Deleted)
BP controlled; no change in antihypertensive medications  

## 2017-04-20 NOTE — Assessment & Plan Note (Addendum)
The calcium channel blocker diltiazem is an excellent choice for her as it will not aggravate her reactive airways disease  Although she has abnormal breath sounds, she's asymptomatic With symptoms pulmonary toilet will be introduced as needed

## 2017-04-20 NOTE — Assessment & Plan Note (Signed)
BP controlled; no change in antihypertensive medications  

## 2017-04-20 NOTE — Assessment & Plan Note (Signed)
Rate adequately controlled 

## 2017-04-21 NOTE — Patient Instructions (Signed)
See assessment and plan under each diagnosis in the problem list and acutely for this visit 

## 2017-06-27 ENCOUNTER — Non-Acute Institutional Stay (SKILLED_NURSING_FACILITY): Payer: Medicare Other | Admitting: Internal Medicine

## 2017-06-27 ENCOUNTER — Encounter: Payer: Self-pay | Admitting: Internal Medicine

## 2017-06-27 DIAGNOSIS — F0390 Unspecified dementia without behavioral disturbance: Secondary | ICD-10-CM

## 2017-06-27 DIAGNOSIS — I10 Essential (primary) hypertension: Secondary | ICD-10-CM | POA: Diagnosis not present

## 2017-06-27 DIAGNOSIS — I482 Chronic atrial fibrillation, unspecified: Secondary | ICD-10-CM

## 2017-06-27 NOTE — Assessment & Plan Note (Signed)
06/27/17 clinically the patient is in a regular rhythm on diltiazem

## 2017-06-27 NOTE — Progress Notes (Signed)
    NURSING HOME LOCATION:  [facility] ROOM NUMBER:  [id number]   CODE STATUS:  DNR  PCP:  Pecola Lawless, MD  [address] [place] [address]  This is a nursing facility follow up of chronic medical diagnoses.  Interim medical record and care since last [facility] visit was updated with review of diagnostic studies and change in clinical status since last visit were documented.  HPI: She is a permanent resident of the facility with medical diagnoses of chronic atrial fibrillation, hypertension associated with an renal disease, COPD, dementia without behavioral disturbance, and chronic anemia. Most recent labs were 02/10/17: chemistries, electrolytes, and renal function were normal. Hemoglobin had dropped from 39 to 35 and hematocrit from 12.6 to 12. B12 level was normal as was vitamin D level. TSH was therapeutic.  Review of systems: Dementia invalidated responses. She was unable to give the date. She denies any active symptoms. As I went through the review of systems every answer was "nothing". At 11 AM she was still in bed and she stated "I want to get up" repeatedly.  Constitutional: No fever, significant weight change, fatigue  Eyes: No redness, discharge, pain, vision change ENT/mouth: No nasal congestion,  purulent discharge, earache, change in hearing, sore throat  Cardiovascular: No chest pain, palpitations, paroxysmal nocturnal dyspnea, claudication, edema  Respiratory: No cough, sputum production, hemoptysis, DOE, significant snoring, apnea   Gastrointestinal: No heartburn, dysphagia, abdominal pain, nausea /vomiting, rectal bleeding, melena, change in bowels Genitourinary: No dysuria, hematuria, pyuria, incontinence, nocturia Musculoskeletal: No joint stiffness, joint swelling, weakness, pain Dermatologic: No rash, pruritus, change in appearance of skin Neurologic: No dizziness, headache, syncope, seizures, numbness, tingling Psychiatric: No significant anxiety,  depression, insomnia, anorexia Endocrine: No change in hair/skin/nails, excessive thirst, excessive hunger, excessive urination  Hematologic/lymphatic: No significant bruising, lymphadenopathy, abnormal bleeding Allergy/immunology: No itchy/watery eyes, significant sneezing, urticaria, angioedema  Physical exam:  Pertinent or positive findings: She appears the stated age. Arcus senilis is present. She's wearing only the upper plate. Heart rhythm is regular with suggestion of slight flow murmur. Pedal pulses are decreased. She is generally weak in all extremities. She's wearing an anti-wandering bracelet on the right lower extremity. She has mixed arthritic changes of the PIP and DIP joints.  General appearance: Adequately nourished; no acute distress, increased work of breathing is present.   Lymphatic: No lymphadenopathy about the head, neck, axilla. Eyes: No conjunctival inflammation or lid edema is present. There is no scleral icterus. Ears:  External ear exam shows no significant lesions or deformities.   Nose:  External nasal examination shows no deformity or inflammation. Nasal mucosa are pink and moist without lesions, exudates Oral exam:  Lips and gums are healthy appearing. There is no oropharyngeal erythema or exudate. Neck:  No thyromegaly, masses, tenderness noted.    Heart:  Normal rate and regular rhythm. S1 and S2 normal without gallop, click, rub .  Lungs: without wheezes, rhonchi, rales, rubs. Abdomen: Bowel sounds are normal. Abdomen is soft and nontender with no organomegaly, hernias, masses. GU: Deferred  Extremities:  No cyanosis, clubbing, edema  Neurologic exam : Balance, Rhomberg, finger to nose testing could not be completed due to clinical state Skin: Warm & dry w/o tenting. No significant lesions or rash.  See summary under each active problem in the Problem List with associated updated therapeutic plan.

## 2017-06-27 NOTE — Assessment & Plan Note (Addendum)
Blood pressures have been well controlled on CCB & low dose HCTZ with a range of 88/58-143/84 on CCB Only 4 of the 23 record blood pressures exhibited a systolic less than 100 Were the average systolic blood pressure to be 956 or less , diltiazem dose would be decreased

## 2017-06-27 NOTE — Patient Instructions (Signed)
See assessment and plan under each diagnosis in the problem list and acutely for this visit 

## 2017-06-27 NOTE — Assessment & Plan Note (Signed)
Namenda would not likely help vascular dementia, but the patient is stable in reference to her dementia on present regimen

## 2017-07-28 LAB — HM DIABETES FOOT EXAM

## 2017-09-05 ENCOUNTER — Non-Acute Institutional Stay (SKILLED_NURSING_FACILITY): Payer: Medicare Other | Admitting: Internal Medicine

## 2017-09-05 ENCOUNTER — Encounter: Payer: Self-pay | Admitting: Internal Medicine

## 2017-09-05 DIAGNOSIS — I482 Chronic atrial fibrillation, unspecified: Secondary | ICD-10-CM

## 2017-09-05 DIAGNOSIS — I10 Essential (primary) hypertension: Secondary | ICD-10-CM

## 2017-09-05 DIAGNOSIS — F0390 Unspecified dementia without behavioral disturbance: Secondary | ICD-10-CM | POA: Diagnosis not present

## 2017-09-05 NOTE — Assessment & Plan Note (Addendum)
Clinically stable on present psychotropic regimen; no change indicated

## 2017-09-05 NOTE — Patient Instructions (Signed)
See assessment and plan under each diagnosis in the problem list and acutely for this visit 

## 2017-09-05 NOTE — Assessment & Plan Note (Signed)
BP controlled; no change in antihypertensive medications  

## 2017-09-05 NOTE — Assessment & Plan Note (Addendum)
09/05/17 Clinically rhythm is regular; rate controlled

## 2017-09-05 NOTE — Progress Notes (Signed)
    NURSING HOME LOCATION:  [facility] ROOM NUMBER:  [id number]  CODE STATUS:  DNR  PCP:  Pecola LawlessHopper, William F, MD  [address] [place] [id number]  This is a nursing facility follow up of chronic medical diagnoses.  Interim medical record and care since last [facility] visit was updated with review of diagnostic studies and change in clinical status since last visit were documented.  HPI: Patient is a permanent resident of the facility with diagnoses of dementia without behavioral disturbance, chronic anemia, COPD, essential hypertension, and chronic atrial fibrillation.  Labs not current.  Review of systems: Dementia precluded completion of review of systems.  Physical exam:  Pertinent or positive findings: She gave date as [id number] & President as [name] BenesJohnson. She exhibits mask facies. Edentulous; wears only upper plate.Grade 1.5 systolic murmur.Isolated inspiratory pops.Decreased pedal pulses.Trace edema present.Mixed DIP/PIP changes. Antiwandering bracelet on ankles. Surprisingly strong to opposition.  General appearance: Adequately nourished; no acute distress, increased work of breathing is present.   Lymphatic: No lymphadenopathy about the head, neck, axilla. Eyes: No conjunctival inflammation or lid edema is present. There is no scleral icterus. Ears:  External ear exam shows no significant lesions or deformities.   Nose:  External nasal examination shows no deformity or inflammation. Nasal mucosa are pink and moist without lesions, exudates Oral exam:  Lips and gums are healthy appearing. There is no oropharyngeal erythema or exudate. Neck:  No thyromegaly, masses, tenderness noted.    Heart:  No gallop, click, rub .  Lungs:  without wheezes, rubs. Abdomen: Bowel sounds are normal. Abdomen is soft and nontender with no organomegaly, hernias, masses. GU: Deferred  Extremities:  No cyanosis, clubbing Neurologic exam : Balance, Rhomberg, finger to nose testing could not be  completed due to clinical state Skin: Warm & dry w/o tenting. No significant lesions or rash.  See summary under each active problem in the Problem List with associated updated therapeutic plan

## 2017-09-19 ENCOUNTER — Non-Acute Institutional Stay (SKILLED_NURSING_FACILITY): Payer: Medicare Other

## 2017-09-19 DIAGNOSIS — Z0000 Encounter for general adult medical examination without abnormal findings: Secondary | ICD-10-CM

## 2017-09-19 NOTE — Patient Instructions (Addendum)
[name] , Thank you for taking time to come for your Medicare Wellness Visit. I appreciate your ongoing commitment to your health goals. Please review the following plan we discussed and let me know if I can assist you in the future.   Screening recommendations/referrals: Colonoscopy excluded, over age 82 Mammogram excluded, over age 82 Bone Density excluded Recommended yearly ophthalmology/optometry visit for glaucoma screening and checkup Recommended yearly dental visit for hygiene and checkup  Vaccinations: Influenza vaccine due Pneumococcal vaccine up to date, completed Tdap vaccine up to date, due 03/23/2021 Shingles vaccine not in past records    Advanced directives: in chart  Conditions/risks identified: none  Next appointment: Dr. Alwyn [name] makes rounds   Preventive Care 65 Years and Older, Female Preventive care refers to lifestyle choices and visits with your health care provider that can promote health and wellness. What does preventive care include?  A yearly physical exam. This is also called an annual well check.  Dental exams once or twice a year.  Routine eye exams. Ask your health care provider how often you should have your eyes checked.  Personal lifestyle choices, including:  Daily care of your teeth and gums.  Regular physical activity.  Eating a healthy diet.  Avoiding tobacco and drug use.  Limiting alcohol use.  Practicing safe sex.  Taking low-dose aspirin every day.  Taking vitamin and mineral supplements as recommended by your health care provider. What happens during an annual well check? The services and screenings done by your health care provider during your annual well check will depend on your age, overall health, lifestyle risk factors, and family history of disease. Counseling  Your health care provider may ask you questions about your:  Alcohol use.  Tobacco use.  Drug use.  Emotional well-being.  Home and relationship  well-being.  Sexual activity.  Eating habits.  History of falls.  Memory and ability to understand (cognition).  Work and work Astronomerenvironment.  Reproductive health. Screening  You may have the following tests or measurements:  Height, weight, and BMI.  Blood pressure.  Lipid and cholesterol levels. These may be checked every 5 years, or more frequently if you are over 82 years old.  Skin check.  Lung cancer screening. You may have this screening every year starting at age 82 if you have a 30-pack-year history of smoking and currently smoke or have quit within the past 15 years.  Fecal occult blood test (FOBT) of the stool. You may have this test every year starting at age 82.  Flexible sigmoidoscopy or colonoscopy. You may have a sigmoidoscopy every 5 years or a colonoscopy every 10 years starting at age 82.  Hepatitis C blood test.  Hepatitis B blood test.  Sexually transmitted disease (STD) testing.  Diabetes screening. This is done by checking your blood sugar (glucose) after you have not eaten for a while (fasting). You may have this done every 1-3 years.  Bone density scan. This is done to screen for osteoporosis. You may have this done starting at age 82.  Mammogram. This may be done every 1-2 years. Talk to your health care provider about how often you should have regular mammograms. Talk with your health care provider about your test results, treatment options, and if necessary, the need for more tests. Vaccines  Your health care provider may recommend certain vaccines, such as:  Influenza vaccine. This is recommended every year.  Tetanus, diphtheria, and acellular pertussis (Tdap, Td) vaccine. You may need a Td booster  every 10 years.  Zoster vaccine. You may need this after age 22.  Pneumococcal 13-valent conjugate (PCV13) vaccine. One dose is recommended after age 14.  Pneumococcal polysaccharide (PPSV23) vaccine. One dose is recommended after age  75. Talk to your health care provider about which screenings and vaccines you need and how often you need them. This information is not intended to replace advice given to you by your health care provider. Make sure you discuss any questions you have with your health care provider. Document Released: 02/13/2015 Document Revised: 10/07/2015 Document Reviewed: 11/18/2014 Elsevier Interactive Patient Education  2017 [place] Prevention in the Home Falls can cause injuries. They can happen to people of all ages. There are many things you can do to make your home safe and to help prevent falls. What can I do on the outside of my home?  Regularly fix the edges of walkways and driveways and fix any cracks.  Remove anything that might make you trip as you walk through a door, such as a raised step or threshold.  Trim any bushes or trees on the path to your home.  Use bright outdoor lighting.  Clear any walking paths of anything that might make someone trip, such as rocks or tools.  Regularly check to see if handrails are loose or broken. Make sure that both sides of any steps have handrails.  Any raised decks and porches should have guardrails on the edges.  Have any leaves, snow, or ice cleared regularly.  Use sand or salt on walking paths during winter.  Clean up any spills in your garage right away. This includes oil or grease spills. What can I do in the bathroom?  Use night lights.  Install grab bars by the toilet and in the tub and shower. Do not use towel bars as grab bars.  Use non-skid mats or decals in the tub or shower.  If you need to sit down in the shower, use a plastic, non-slip stool.  Keep the floor dry. Clean up any water that spills on the floor as soon as it happens.  Remove soap buildup in the tub or shower regularly.  Attach bath mats securely with double-sided non-slip rug tape.  Do not have throw rugs and other things on the floor that can make  you trip. What can I do in the bedroom?  Use night lights.  Make sure that you have a light by your bed that is easy to reach.  Do not use any sheets or blankets that are too big for your bed. They should not hang down onto the floor.  Have a firm chair that has side arms. You can use this for support while you get dressed.  Do not have throw rugs and other things on the floor that can make you trip. What can I do in the kitchen?  Clean up any spills right away.  Avoid walking on wet floors.  Keep items that you use a lot in easy-to-reach places.  If you need to reach something above you, use a strong step stool that has a grab bar.  Keep electrical cords out of the way.  Do not use floor polish or wax that makes floors slippery. If you must use wax, use non-skid floor wax.  Do not have throw rugs and other things on the floor that can make you trip. What can I do with my stairs?  Do not leave any items on the stairs.  Make  sure that there are handrails on both sides of the stairs and use them. Fix handrails that are broken or loose. Make sure that handrails are as long as the stairways.  Check any carpeting to make sure that it is firmly attached to the stairs. Fix any carpet that is loose or worn.  Avoid having throw rugs at the top or bottom of the stairs. If you do have throw rugs, attach them to the floor with carpet tape.  Make sure that you have a light switch at the top of the stairs and the bottom of the stairs. If you do not have them, ask someone to add them for you. What else can I do to help prevent falls?  Wear shoes that:  Do not have high heels.  Have rubber bottoms.  Are comfortable and fit you well.  Are closed at the toe. Do not wear sandals.  If you use a stepladder:  Make sure that it is fully opened. Do not climb a closed stepladder.  Make sure that both sides of the stepladder are locked into place.  Ask someone to hold it for you, if  possible.  Clearly mark and make sure that you can see:  Any grab bars or handrails.  First and last steps.  Where the edge of each step is.  Use tools that help you move around (mobility aids) if they are needed. These include:  Canes.  Walkers.  Scooters.  Crutches.  Turn on the lights when you go into a dark area. Replace any light bulbs as soon as they burn out.  Set up your furniture so you have a clear path. Avoid moving your furniture around.  If any of your floors are uneven, fix them.  If there are any pets around you, be aware of where they are.  Review your medicines with your doctor. Some medicines can make you feel dizzy. This can increase your chance of falling. Ask your doctor what other things that you can do to help prevent falls. This information is not intended to replace advice given to you by your health care provider. Make sure you discuss any questions you have with your health care provider. Document Released: 11/13/2008 Document Revised: 06/25/2015 Document Reviewed: 02/21/2014 Elsevier Interactive Patient Education  2017 [employer].

## 2017-09-19 NOTE — Progress Notes (Signed)
Subjective:   JALEI SHIBLEY is a 84 y.o. female who presents for Medicare Annual (Subsequent) preventive examination at [facility]   Last AWV-09/22/2016    Objective:     Vitals: BP 122/75 (BP Location: Left Arm, Patient Position: Sitting)   Pulse 74   Temp 98 F (36.7 C) (Oral)   Ht 5\' 4"  (1.626 m)   Wt 140 lb (63.5 kg)   BMI 24.03 kg/m   Body mass index is 24.03 kg/m.  Advanced Directives 09/19/2017 09/05/2017 06/27/2017 06/27/2017 04/20/2017 12/29/2016 09/22/2016  Does Patient Have a Medical Advance Directive? Yes Yes Yes Yes Yes Yes Yes  Type of Advance Directive Out of facility DNR (pink MOST or yellow form) Out of facility DNR (pink MOST or yellow form) - Out of facility DNR (pink MOST or yellow form) Out of facility DNR (pink MOST or yellow form) Out of facility DNR (pink MOST or yellow form) Out of facility DNR (pink MOST or yellow form)  Does patient want to make changes to medical advance directive? No - Patient declined No - Patient declined No - Patient declined No - Patient declined No - Patient declined No - Patient declined No - Patient declined  Copy of Healthcare Power of Attorney in Chart? - - - - - - -  Would patient like information on creating a medical advance directive? - - - - - - -  Pre-existing out of facility DNR order (yellow form or pink MOST form) Yellow form placed in chart (order not valid for inpatient use) - - - - - Yellow form placed in chart (order not valid for inpatient use);Pink MOST form placed in chart (order not valid for inpatient use)    Tobacco Social History   Tobacco Use  Smoking Status Never Smoker  Smokeless Tobacco Never Used     Counseling given: Not Answered   Clinical Intake:  Pre-visit preparation completed: No  Pain : No/denies pain     Nutritional Risks: None Diabetes: No  How often do you need to have someone help you when you read instructions, pamphlets, or other written materials from your doctor or  pharmacy?: 1 - Never What is the last grade level you completed in school?: High School  Interpreter Needed?: No  Information entered by :: Tyron Russell ,RN  Past Medical History:  Diagnosis Date  . Atrial fib/flutter, transient   . Chronic kidney disease   . COPD (chronic obstructive pulmonary disease) (HCC)   . E. coli UTI (urinary tract infection) 07/06/2016   pan sensitive; Cipro X 5 days  . GERD (gastroesophageal reflux disease)   . Hypertension    Past Surgical History:  Procedure Laterality Date  . CHOLECYSTECTOMY    . HIP ARTHROPLASTY Right 05/24/2013   Procedure: RIGHT HIP HEMIARTHROPLASTY;  Surgeon: Kathryne Hitch, MD;  Location: [facility] OR;  Service: Orthopedics;  Laterality: Right;  . NASAL SINUS SURGERY     History reviewed. No pertinent family history. Social History   Socioeconomic History  . Marital status: Widowed    Spouse name: Not on file  . Number of children: Not on file  . Years of education: Not on file  . Highest education level: Not on file  Occupational History  . Not on file  Social Needs  . Financial resource strain: Not hard at all  . Food insecurity:    Worry: Never true    Inability: Never true  . Transportation needs:    Medical: No  Non-medical: No  Tobacco Use  . Smoking status: Never Smoker  . Smokeless tobacco: Never Used  Substance and Sexual Activity  . Alcohol use: No  . Drug use: No  . Sexual activity: Never    Birth control/protection: Post-menopausal  Lifestyle  . Physical activity:    Days per week: 0 days    Minutes per session: 0 min  . Stress: Not at all  Relationships  . Social connections:    Talks on phone: Never    Gets together: Three times a week    Attends religious service: Never    Active member of club or organization: No    Attends meetings of clubs or organizations: Never    Relationship status: Widowed  Other Topics Concern  . Not on file  Social History Narrative  . Not on file     Outpatient Encounter Medications as of 09/19/2017  Medication Sig  . acetaminophen (TYLENOL ARTHRITIS PAIN) 650 MG CR tablet Take 650 mg by mouth 2 (two) times daily.  . AMBULATORY NON FORMULARY MEDICATION NSA Med Pass: Give 120 ml twice daily for supplement.  Marland Kitchen. ascorbic acid (VITAMIN C) 500 MG tablet Take 500 mg by mouth 2 (two) times daily.  . bisacodyl (DULCOLAX) 10 MG suppository Give 10mg  rectally x 1 dose in 24 hours as needed for constipation if not relieved by MOM  . Cholecalciferol 50000 units capsule Give 1 capsule on the 12th and the 28th  of every month for supplement.  . cyanocobalamin (,VITAMIN B-12,) 1000 MCG/ML injection Inject 1,000 mcg into the muscle every 30 (thirty) days.  Marland Kitchen. diltiazem (DILACOR XR) 180 MG 24 hr capsule Take 180 mg by mouth daily. Check BP and pulse and hold if HR<55 and/or SBP<100  . ferrous sulfate 325 (65 FE) MG tablet Take 325 mg by mouth 2 (two) times daily.   . fluticasone (FLONASE) 50 MCG/ACT nasal spray Place 1 spray into the nose daily.   . hydrochlorothiazide (MICROZIDE) 12.5 MG capsule Take 12.5 mg by mouth daily.  . magnesium hydroxide (MILK OF MAGNESIA) 400 MG/5ML suspension Take 30 mLs by mouth daily as needed for mild constipation.  . memantine (NAMENDA) 10 MG tablet Take 10 mg by mouth 2 (two) times daily.  . Menthol 5 MG LOZG Use as directed 1 each in the mouth or throat every 4 (four) hours as needed (cough).  . mirtazapine (REMERON) 15 MG tablet Take 15 mg by mouth at bedtime. For depression /appetite suppressant  . Multiple Vitamins-Minerals (PRESERVISION AREDS 2) CAPS Take 1 tablet by mouth 2 (two) times daily. For Macular Degeneration  . Nutritional Supplements (ENSURE CLEAR PO) Take one daily for poor appetite  . OXYGEN Inhale 2-4 L/min into the lungs as needed (To keep sat >90%).  . pantoprazole (PROTONIX) 20 MG tablet Take 20 mg by mouth daily before breakfast.  . PARoxetine (PAXIL) 10 MG tablet Take 10 mg by mouth daily.  .  polyethylene glycol (MIRALAX / GLYCOLAX) packet Take 17 g by mouth daily.  Marland Kitchen. UNABLE TO FIND Med Name: Magic Cup daily with lunch   No facility-administered encounter medications on file as of 09/19/2017.     Activities of Daily Living In your present state of health, do you have any difficulty performing the following activities: 09/19/2017 09/22/2016  Hearing? Y N  Vision? N N  Difficulty concentrating or making decisions? Malvin JohnsY Y  Walking or climbing stairs? Y Y  Dressing or bathing? Y Y  Doing errands, shopping? Malvin JohnsY Y  Preparing Food and eating ? Y Y  Using the Toilet? Y Y  In the past six months, have you accidently leaked urine? Y Y  Do you have problems with loss of bowel control? Y Y  Managing your Medications? Y Y  Managing your Finances? Malvin JohnsY Y  Housekeeping or managing your Housekeeping? Malvin JohnsY Y  Some recent data might be hidden    Patient Care Team: Pecola LawlessHopper, William F, MD as PCP - General (Internal Medicine) Sharon SellerEubanks, Jessica K, NP as Nurse Practitioner (Nurse Practitioner)    Assessment:   This is a routine wellness examination for Jerricka.  Exercise Activities and Dietary recommendations Current Exercise Habits: The patient does not participate in regular exercise at present, Exercise limited by: orthopedic condition(s)  Goals   None     Fall Risk Fall Risk  09/19/2017 09/22/2016  Falls in the past year? No No   Is the patient's home free of loose throw rugs in walkways, pet beds, electrical cords, etc?   yes      Grab bars in the bathroom? yes      Handrails on the stairs?   yes      Adequate lighting?   yes  Depression Screen PHQ 2/9 Scores 09/19/2017 09/22/2016  PHQ - 2 Score 0 0     Cognitive Function     6CIT Screen 09/19/2017 09/22/2016  What Year? 0 points 4 points  What month? 3 points 3 points  What time? 3 points 3 points  Count back from 20 0 points 4 points  Months in reverse 4 points 4 points  Repeat phrase 10 points 10 points  Total Score 20 28     Immunization History  Administered Date(s) Administered  . DTaP 09/23/2016  . Influenza-Unspecified 11/01/2011, 11/12/2014, 10/29/2015, 11/28/2016  . Pneumococcal-Unspecified 08/17/2008  . Tdap 03/24/2011    Qualifies for Shingles Vaccine? Not in past records  Screening Tests Health Maintenance  Topic Date Due  . INFLUENZA VACCINE  12/01/2017 (Originally 08/31/2017)  . DEXA SCAN  02/01/2023 (Originally 08/08/1993)  . TETANUS/TDAP  03/23/2021  . PNA vac Low Risk Adult  Completed    Cancer Screenings: Lung: Low Dose CT Chest recommended if Age 55-80 years, 30 pack-year currently smoking OR have quit w/in 15years. Patient does not qualify. Breast:  Up to date on Mammogram? Yes   Up to date of Bone Density/Dexa? Excluded, patient is unambulatory and has dementia Colorectal: up to date  Additional Screenings:  Hepatitis C Screening: declined Flu vaccine due[facility]    Plan:    I have personally reviewed and addressed the Medicare Annual Wellness questionnaire and have noted the following in the patient's chart:  A. Medical and social history B. Use of alcohol, tobacco or illicit drugs  C. Current medications and supplements D. Functional ability and status E.  Nutritional status F.  Physical activity G. Advance directives H. List of other physicians I.  Hospitalizations, surgeries, and ER visits in previous 12 months J.  Vitals K. Screenings to include hearing, vision, cognitive, depression L. Referrals and appointments - none  In addition, I have reviewed and discussed with patient certain preventive protocols, quality metrics, and best practice recommendations. A written personalized care plan for preventive services as well as general preventive health recommendations were provided to patient.  See attached scanned questionnaire for additional information.   Signed,   Tyron RussellSara Zenita Kister, RN Nurse Health Advisor  Patient Concerns: None

## 2017-09-21 LAB — BASIC METABOLIC PANEL
BUN: 31 — AB (ref 4–21)
Creatinine: 0.8 (ref 0.5–1.1)
Glucose: 154
POTASSIUM: 3.4 (ref 3.4–5.3)
SODIUM: 141 (ref 137–147)

## 2017-09-21 LAB — CBC AND DIFFERENTIAL
HEMATOCRIT: 42 (ref 36–46)
HEMOGLOBIN: 13.7 (ref 12.0–16.0)
NEUTROS ABS: 11
Platelets: 442 — AB (ref 150–399)
WBC: 13.2

## 2017-09-22 ENCOUNTER — Other Ambulatory Visit (HOSPITAL_COMMUNITY): Payer: Self-pay | Admitting: Internal Medicine

## 2017-09-22 DIAGNOSIS — R1310 Dysphagia, unspecified: Secondary | ICD-10-CM

## 2017-10-03 ENCOUNTER — Ambulatory Visit (HOSPITAL_COMMUNITY)
Admission: RE | Admit: 2017-10-03 | Discharge: 2017-10-03 | Disposition: A | Discharge status: Home / Self Care | Payer: Medicare Other | Source: Ambulatory Visit | Attending: Internal Medicine | Admitting: Internal Medicine

## 2017-10-03 DIAGNOSIS — R1312 Dysphagia, oropharyngeal phase: Secondary | ICD-10-CM | POA: Insufficient documentation

## 2017-10-03 DIAGNOSIS — R1310 Dysphagia, unspecified: Secondary | ICD-10-CM

## 2017-10-03 NOTE — Progress Notes (Signed)
Modified Barium Swallow Progress Note  Patient Details  Name: RIANA COLLINSON MRN: [id number] Date of Birth: Jan 27, 1927  Today's Date: 10/03/2017  Modified Barium Swallow completed.  Full report located under Chart Review in the Imaging Section.  Brief recommendations include the following:  Clinical Impression  Pt's mild-moderate oral dysphagia is marked by decreased ability to control bolus and maintain cohesion with sublingual spill with thin liquids. Mastication and transit with regular texture was delayed with reduced posterior propulsion. Swallows with thin barium were initiated at the level of the pyriform sinuses however arytenoid contact to epiglottic base adequate for complete laryngeal closure preventing penetration or aspiration during today's study. Vallecular residue intermittent and minimal in vallecuale. Recommend pt upgrade to thin liquids with or without straw use, continue current diet per SLP (mech soft/puree meats) and full supervision/assist with meals.    Swallow Evaluation Recommendations       SLP Diet Recommendations: Dysphagia 3 (Mech soft) solids;Other (Comment);Thin liquid(puree meats)   Liquid Administration via: Cup;Straw   Medication Administration: Other (Comment)(per SLP)   Supervision: Full supervision/cueing for compensatory strategies;Patient able to self feed;Staff to assist with self feeding   Compensations: Slow rate;Small sips/bites;Minimize environmental distractions   Postural Changes: Seated upright at 90 degrees;Remain semi-upright after after feeds/meals (Comment)   Oral Care Recommendations: Oral care BID        Royce Macadamia 10/03/2017,3:43 PM   Breck Coons Lonell Face.Ed [employer] [phone]

## 2018-01-02 ENCOUNTER — Encounter: Payer: Self-pay | Admitting: Internal Medicine

## 2018-01-02 ENCOUNTER — Non-Acute Institutional Stay (SKILLED_NURSING_FACILITY): Payer: Medicare Other | Admitting: Internal Medicine

## 2018-01-02 DIAGNOSIS — I482 Chronic atrial fibrillation, unspecified: Secondary | ICD-10-CM | POA: Diagnosis not present

## 2018-01-02 DIAGNOSIS — I10 Essential (primary) hypertension: Secondary | ICD-10-CM

## 2018-01-02 DIAGNOSIS — R1312 Dysphagia, oropharyngeal phase: Secondary | ICD-10-CM | POA: Diagnosis not present

## 2018-01-02 DIAGNOSIS — F0390 Unspecified dementia without behavioral disturbance: Secondary | ICD-10-CM | POA: Diagnosis not present

## 2018-01-02 NOTE — Assessment & Plan Note (Addendum)
Pleasantly demented, giving the name of the president as Andrey CampanileWilson Unable to name the year No abnormal behavior reported

## 2018-01-02 NOTE — Assessment & Plan Note (Signed)
Speech therapy reports no significant aspiration phenomena but will continue to follow on present diet

## 2018-01-02 NOTE — Assessment & Plan Note (Addendum)
Rate is well controlled, no change in relatively low-dose calcium channel blocker will be made. D/C HCTZ because of hypokalemia and relative hypotension

## 2018-01-02 NOTE — Patient Instructions (Signed)
See assessment and plan under each diagnosis in the problem list and acutely for this visit 

## 2018-01-02 NOTE — Progress Notes (Signed)
    NURSING HOME LOCATION:  [facility] ROOM NUMBER:  [id number]  CODE STATUS:  DNR  PCP:  Pecola LawlessHopper, Greogry Goodwyn F, MD  [address] [place] [id number]  This is a nursing facility follow up of chronic medical diagnoses.  Interim medical record and care since last [facility] visit was updated with review of diagnostic studies and change in clinical status since last visit were documented.  HPI: She is a permanent resident of the facility with medical diagnoses of essential hypertension, chronic A. fib, COPD, chronic anemia, and dementia without behavioral disturbance.   She was evaluated for oropharyngeal dysphagia 10/03/2017.  Thin liquids with or without a straw and mechanical soft/pured meat diet was recommended for mild to moderate aspiration risk.  Speech therapy continues to follow her. Labs were last performed 09/21/2017.  BUN was 31 but creatinine was normal.  Potassium was minimally reduced at 3.4 on HCTZ.  The anemia had resolved.  Review of systems: Dementia invalidated responses. Date given as December 29.  She cannot provide the year.  She named Andrey CampanileWilson as [employer] president. She denied any active symptoms particularly of a cardiopulmonary nature.  She had previously been on opiates for headaches.  She denies any headaches or other pain including osteoarthritic pain.  Physical exam:  Pertinent or positive findings: She appears younger than her stated age.  She wears only the upper plate.  She has a grade 1 systolic murmur.Rhythm is not significantly irregular. Diffuse low-grade rales are present.  Pedal pulses are decreased.  She has mixed DIP/PIP arthritic changes.  She is diffusely weak to opposition.  General appearance: Adequately nourished; no acute distress, increased work of breathing is present.   Lymphatic: No lymphadenopathy about the head, neck, axilla. Eyes: No conjunctival inflammation or lid edema is present. There is no scleral icterus. Ears:  External ear exam  shows no significant lesions or deformities.   Nose:  External nasal examination shows no deformity or inflammation. Nasal mucosa are pink and moist without lesions, exudates Oral exam:  Lips and gums are healthy appearing. There is no oropharyngeal erythema or exudate. Neck:  No thyromegaly, masses, tenderness noted.    Heart:  No gallop, click, rub .  Lungs: without wheezes, rhonchi,  rubs. Abdomen: Bowel sounds are normal. Abdomen is soft and nontender with no organomegaly, hernias, masses. GU: Deferred  Extremities:  No cyanosis, clubbing, edema  Neurologic exam : Balance, Rhomberg, finger to nose testing could not be completed due to clinical state Skin: Warm & dry w/o tenting. No significant lesions or rash.  See summary under each active problem in the Problem List with associated updated therapeutic plan

## 2018-01-02 NOTE — Assessment & Plan Note (Addendum)
Blood pressure is actually low, she is on calcium channel blocker for rate control for the chronic A. fib and also HCTZ.  Potassium is slightly low. HCTZ will be held and blood pressure monitored.  Low-dose spironolactone could be initiated if blood pressure significantly elevated off HCTZ

## 2018-01-03 ENCOUNTER — Encounter: Payer: Self-pay | Admitting: Internal Medicine

## 2018-02-22 ENCOUNTER — Emergency Department (HOSPITAL_COMMUNITY)
Admission: EM | Admit: 2018-02-22 | Discharge: 2018-02-23 | Disposition: A | Discharge status: Home / Self Care | Payer: Medicare Other | Attending: Emergency Medicine | Admitting: Emergency Medicine

## 2018-02-22 DIAGNOSIS — W0110XA Fall on same level from slipping, tripping and stumbling with subsequent striking against unspecified object, initial encounter: Secondary | ICD-10-CM | POA: Diagnosis not present

## 2018-02-22 DIAGNOSIS — Y9389 Activity, other specified: Secondary | ICD-10-CM | POA: Insufficient documentation

## 2018-02-22 DIAGNOSIS — N182 Chronic kidney disease, stage 2 (mild): Secondary | ICD-10-CM | POA: Diagnosis not present

## 2018-02-22 DIAGNOSIS — S0083XA Contusion of other part of head, initial encounter: Secondary | ICD-10-CM | POA: Insufficient documentation

## 2018-02-22 DIAGNOSIS — Z96641 Presence of right artificial hip joint: Secondary | ICD-10-CM | POA: Insufficient documentation

## 2018-02-22 DIAGNOSIS — F0390 Unspecified dementia without behavioral disturbance: Secondary | ICD-10-CM | POA: Diagnosis not present

## 2018-02-22 DIAGNOSIS — Y92129 Unspecified place in nursing home as the place of occurrence of the external cause: Secondary | ICD-10-CM | POA: Diagnosis not present

## 2018-02-22 DIAGNOSIS — Z79899 Other long term (current) drug therapy: Secondary | ICD-10-CM | POA: Insufficient documentation

## 2018-02-22 DIAGNOSIS — I129 Hypertensive chronic kidney disease with stage 1 through stage 4 chronic kidney disease, or unspecified chronic kidney disease: Secondary | ICD-10-CM | POA: Insufficient documentation

## 2018-02-22 DIAGNOSIS — W19XXXA Unspecified fall, initial encounter: Secondary | ICD-10-CM

## 2018-02-22 DIAGNOSIS — Y998 Other external cause status: Secondary | ICD-10-CM | POA: Insufficient documentation

## 2018-02-22 DIAGNOSIS — S0990XA Unspecified injury of head, initial encounter: Secondary | ICD-10-CM | POA: Diagnosis present

## 2018-02-22 DIAGNOSIS — Z9049 Acquired absence of other specified parts of digestive tract: Secondary | ICD-10-CM | POA: Insufficient documentation

## 2018-02-22 DIAGNOSIS — J449 Chronic obstructive pulmonary disease, unspecified: Secondary | ICD-10-CM | POA: Diagnosis not present

## 2018-02-22 DIAGNOSIS — S0181XA Laceration without foreign body of other part of head, initial encounter: Secondary | ICD-10-CM | POA: Diagnosis not present

## 2018-02-22 NOTE — ED Triage Notes (Signed)
BIB [facility]. Unwitnessed fall. Poor story from facility staff of how found. Pt at baseline mentally, alert to self and general events. Pt very agitated with facility staff. Remains agitated with EMS, but more calm. Small lac noted to forehead. Hematoma to R temporal area. C-collar in place. Unknown LOC. Not on blood thinners.

## 2018-02-23 ENCOUNTER — Encounter (HOSPITAL_COMMUNITY): Payer: Self-pay | Admitting: Emergency Medicine

## 2018-02-23 ENCOUNTER — Other Ambulatory Visit: Payer: Self-pay

## 2018-02-23 ENCOUNTER — Emergency Department (HOSPITAL_COMMUNITY): Payer: Medicare Other

## 2018-02-23 DIAGNOSIS — S0181XA Laceration without foreign body of other part of head, initial encounter: Secondary | ICD-10-CM | POA: Diagnosis not present

## 2018-02-23 NOTE — ED Notes (Signed)
Patient transported to CT 
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
## 2018-02-23 NOTE — ED Notes (Signed)
Report given to [name] and [facility] crew. Pt departed in NAD, and in care of [facility].

## 2018-02-23 NOTE — ED Notes (Signed)
[facility] called for pt 

## 2018-03-21 LAB — BASIC METABOLIC PANEL
BUN: 15 (ref 4–21)
CREATININE: 0.7 (ref <=1.1)
GLUCOSE: 96
Potassium: 3.9 (ref 3.4–5.3)
Sodium: 147 (ref 137–147)

## 2018-03-21 LAB — CBC AND DIFFERENTIAL
HCT: 34 — AB (ref 36–46)
Hemoglobin: 1.7 — AB (ref 12.0–16.0)
Hemoglobin: 11.7 — AB (ref 12.0–16.0)
PLATELETS: 247 (ref 150–399)
WBC: 5.2

## 2018-03-21 LAB — VITAMIN B12: Vitamin B-12: 933

## 2018-03-21 LAB — HEPATIC FUNCTION PANEL
ALT: 9 (ref 7–35)
AST: 14 (ref 13–35)

## 2018-03-27 ENCOUNTER — Encounter: Payer: Self-pay | Admitting: Internal Medicine

## 2018-03-27 DIAGNOSIS — D649 Anemia, unspecified: Secondary | ICD-10-CM | POA: Insufficient documentation

## 2018-03-28 LAB — VITAMIN D 25 HYDROXY (VIT D DEFICIENCY, FRACTURES): VIT D 25 HYDROXY: 43

## 2018-04-12 ENCOUNTER — Encounter: Payer: Self-pay | Admitting: Internal Medicine

## 2018-04-12 ENCOUNTER — Non-Acute Institutional Stay (SKILLED_NURSING_FACILITY): Payer: Medicare Other | Admitting: Internal Medicine

## 2018-04-12 DIAGNOSIS — J439 Emphysema, unspecified: Secondary | ICD-10-CM

## 2018-04-12 DIAGNOSIS — J9611 Chronic respiratory failure with hypoxia: Secondary | ICD-10-CM | POA: Diagnosis not present

## 2018-04-12 DIAGNOSIS — I480 Paroxysmal atrial fibrillation: Secondary | ICD-10-CM | POA: Insufficient documentation

## 2018-04-12 DIAGNOSIS — F330 Major depressive disorder, recurrent, mild: Secondary | ICD-10-CM

## 2018-04-12 DIAGNOSIS — D649 Anemia, unspecified: Secondary | ICD-10-CM | POA: Diagnosis not present

## 2018-04-12 LAB — COMPLETE METABOLIC PANEL WITH GFR
Albumin: 3.1
CALCIUM: 8.5
CO2: 30
Chloride: 106
Globulin: 2.9
TOTAL PROTEIN: 6 — AB (ref 6.4–8.2)

## 2018-04-12 LAB — CALCITRIOL (1,25 DI-OH VIT D)
VITAMIN D3 1, 25 (OH): 43
Vitamin D2 1, 25 (OH)2: <8

## 2018-04-12 LAB — MAGNESIUM: MAGNESIUM: 1.9

## 2018-04-12 LAB — COMP. METABOLIC PANEL (12): Folic Acid: 17.2

## 2018-04-12 NOTE — Assessment & Plan Note (Signed)
No bleeding dyscrasias reported.  CBC will be rechecked as noted

## 2018-04-12 NOTE — Patient Instructions (Signed)
See assessment and plan under each diagnosis in the problem list and acutely for this visit 

## 2018-04-12 NOTE — Progress Notes (Signed)
NURSING HOME LOCATION:  [facility] ROOM NUMBER: [id number]     CODE STATUS:DNR   PCP:  Pecola Lawless MD  This is a nursing facility follow up of chronic medical diagnoses  Interim medical record and care since last [facility] visit was updated with review of diagnostic studies and change in clinical status since last visit were documented.  HPI: She is a permanent resident of the  facility with medical diagnoses of pulmonary emphysema, major depressive disorder, chronic respiratory failure with hypoxia, chronic A. fib, central hypertension, GERD, dementia, CKD stage II, and chronic anemia with history of both iron and B12 deficiency. She is on supplemental iron. Labs are current and reveal anemia has progressed.On 09/21/2017 hemoglobin 13.7/hematocrit 42; on 03/21/2018 hemoglobin was 11.7/hematocrit 34.  Despite this progression, the B12 and folic acid levels are normal.  Vitamin D level was also normal.  Abnormal labs  include a calcium of 8.5 and albumin of 3.1.  Review of systems: Dementia invalidated her negative responses to all queries.  She could not give the date.  She stated she had never been at this facility.  She gave her age as 88.  She gave her date of birth is [birth year].  Constitutional: No fever, significant weight change, fatigue  Eyes: No redness, discharge, pain, vision change ENT/mouth: No nasal congestion,  purulent discharge, earache, change in hearing, sore throat  Cardiovascular: No chest pain, palpitations, paroxysmal nocturnal dyspnea, claudication, edema  Respiratory: No cough, sputum production, hemoptysis, DOE, significant snoring, apnea   Gastrointestinal: No heartburn, dysphagia, abdominal pain, nausea /vomiting, rectal bleeding, melena, change in bowels Genitourinary: No dysuria, hematuria, pyuria, incontinence, nocturia Musculoskeletal: No joint stiffness, joint swelling, weakness, pain Dermatologic: No rash, pruritus, change in appearance of skin  Neurologic: No dizziness, headache, syncope, seizures, numbness, tingling Psychiatric: No significant anxiety, depression, insomnia, anorexia Endocrine: No change in hair/skin/nails, excessive thirst, excessive hunger, excessive urination  Hematologic/lymphatic: No significant bruising, lymphadenopathy, abnormal bleeding Allergy/immunology: No itchy/watery eyes, significant sneezing, urticaria, angioedema  Physical exam:  Pertinent or positive findings: She appears her stated age.  She tends to stare blankly at the examiner when asked a question.  The left nasolabial fold is decreased.  She is wearing only the upper plate.  Breath sounds are decreased without abnormal breath sounds.  Clinically rhythm is regular today.  Grade 1 systolic murmur is noted.  Pulses are decreased.  She is surprisingly strong to opposition in all extremities.  She has PIP osteoarthritic changes greater on the right than the left.  Interosseous wasting is present.  She has anti-wandering bracelet at the right ankle.  She had great difficulty following simple commands.  When asked to hold up 3 fingers on the right hand she held up several fingers on the right hand and 3 on the left.  General appearance: Adequately nourished; no acute distress, increased work of breathing is present.   Lymphatic: No lymphadenopathy about the head, neck, axilla. Eyes: No conjunctival inflammation or lid edema is present. There is no scleral icterus. Ears:  External ear exam shows no significant lesions or deformities.   Nose:  External nasal examination shows no deformity or inflammation. Nasal mucosa are pink and moist without lesions, exudates Oral exam:  Lips and gums are healthy appearing. There is no oropharyngeal erythema or exudate. Neck:  No thyromegaly, masses, tenderness noted.    Heart:  Normal rate and regular rhythm. S1 and S2 normal without gallop, click, rub .  Lungs: without wheezes, rhonchi, rales, rubs.  Abdomen: Bowel  sounds are normal. Abdomen is soft and nontender with no organomegaly, hernias, masses. GU: Deferred  Extremities:  No cyanosis, clubbing, edema  Neurologic exam : Balance, Rhomberg, finger to nose testing could not be completed due to clinical state Deep tendon reflexes are equal Skin: Warm & dry w/o tenting. No significant lesions or rash.  See summary under each active problem in the Problem List with associated updated therapeutic plan

## 2018-04-18 LAB — CBC AND DIFFERENTIAL
HCT: 34 — AB (ref 36–46)
Hemoglobin: 11.8 — AB (ref 12.0–16.0)
Neutrophils Absolute: 3
Platelets: 257 (ref 150–399)
WBC: 5.7

## 2018-04-18 LAB — CBC: RBC: 3.82 — AB (ref 3.87–5.11)

## 2018-07-26 ENCOUNTER — Non-Acute Institutional Stay (SKILLED_NURSING_FACILITY): Payer: Medicare Other | Admitting: Internal Medicine

## 2018-07-26 ENCOUNTER — Encounter: Payer: Self-pay | Admitting: Internal Medicine

## 2018-07-26 DIAGNOSIS — E559 Vitamin D deficiency, unspecified: Secondary | ICD-10-CM

## 2018-07-26 DIAGNOSIS — I480 Paroxysmal atrial fibrillation: Secondary | ICD-10-CM

## 2018-07-26 DIAGNOSIS — D509 Iron deficiency anemia, unspecified: Secondary | ICD-10-CM

## 2018-07-26 DIAGNOSIS — I10 Essential (primary) hypertension: Secondary | ICD-10-CM

## 2018-07-26 DIAGNOSIS — F0390 Unspecified dementia without behavioral disturbance: Secondary | ICD-10-CM

## 2018-07-26 NOTE — Assessment & Plan Note (Signed)
Vitamin D level will need to be rechecked because of the high-dose vitamin D supplementation once coronavirus quarantine is lifted

## 2018-07-26 NOTE — Patient Instructions (Signed)
See assessment and plan under each diagnosis in the problem list and acutely for this visit 

## 2018-07-26 NOTE — Assessment & Plan Note (Addendum)
BP controlled; no change in antihypertensive medication. She is on a rate controlling CCB.

## 2018-07-26 NOTE — Assessment & Plan Note (Addendum)
Hemoglobin on record is erroneous; hematocrit was 34.  CBC will be updated once [employer] is lifted

## 2018-07-26 NOTE — Assessment & Plan Note (Signed)
Rhythm and rate are controlled without clinical AF

## 2018-07-26 NOTE — Progress Notes (Signed)
   NURSING HOME LOCATION:  [facility] ROOM NUMBER:  [id number]  CODE STATUS:  DNR  PCP:  Hendricks Limes, MD [place] [address]   This is a nursing facility follow up of chronic medical diagnoses.  Interim medical record and care since last [facility] visit was updated with review of diagnostic studies and change in clinical status since last visit were documented.  HPI: She is a permanent resident of the facility with medical diagnoses of essential hypertension, GERD, COPD, CKI, and PAF. Surgeries include cholecystectomy and hip arthroplasty. She is a non-smoker and nondrinker.   Family history is noncontributory as she is 85 years old. Labs are not current.  In February her hemoglobin was listed as 1.7 which is obviously an error. It will be corrected to 11.7. Hematocrit was 34.  Staff has not reported any bleeding dyscrasias.  Review of systems: Dementia invalidated responses.  She stated [place] was president and that she was 85 years old.  No active complaints.  She had difficulty following commands such as holding up a certain number of fingers on a specific hand.  Physical exam:  Pertinent or positive findings: She initially resisted being interviewed and examined, but she was quickly compliant and interacted.  Hair is disheveled.  She stares blankly.  Arcus senilis is present.  She wears only her upper plate.  She has food stains on her chin and blouse.  Grade 6/3-1 systolic murmur is present.  She had minor isolated low-grade rhonchi.  Pedal pulses are decreased.  She is wearing an anti-wandering bracelet on the right ankle.  She has trace edema.  Osteoarthritic changes of the hands are present.  General appearance: Adequately nourished; no acute distress, increased work of breathing is present.   Lymphatic: No lymphadenopathy about the head, neck, axilla. Eyes: No conjunctival inflammation or lid edema is present. There is no scleral icterus.  Ears:  External ear exam shows no significant lesions or deformities.   Nose:  External nasal examination shows no deformity or inflammation. Nasal mucosa are pink and moist without lesions, exudates Oral exam:  Lips and gums are healthy appearing.  Neck:  No thyromegaly, masses, tenderness noted.    Heart:  Normal rate and regular rhythm. S1 and S2 normal without gallop, click, rub .  Lungs:  without wheezes, rales, rubs. Abdomen: Bowel sounds are normal. Abdomen is soft and nontender with no organomegaly, hernias, masses. GU: Deferred  Extremities:  No cyanosis, clubbing  Neurologic exam : Balance, Rhomberg, finger to nose testing could not be completed due to clinical state Skin: Warm & dry w/o tenting. No significant lesions or rash.  See summary under each active problem in the Problem List with associated updated therapeutic plan

## 2018-07-28 NOTE — Assessment & Plan Note (Signed)
There are no significant behavioral issues . She does roam  throughout the SNF w/o purpose, propelling her wheelchair with her feet. No indication for psychotropic medication regimen change

## 2018-10-16 ENCOUNTER — Non-Acute Institutional Stay (SKILLED_NURSING_FACILITY): Payer: Medicare Other | Admitting: Internal Medicine

## 2018-10-16 ENCOUNTER — Encounter: Payer: Self-pay | Admitting: Internal Medicine

## 2018-10-16 DIAGNOSIS — I480 Paroxysmal atrial fibrillation: Secondary | ICD-10-CM | POA: Diagnosis not present

## 2018-10-16 DIAGNOSIS — I10 Essential (primary) hypertension: Secondary | ICD-10-CM | POA: Diagnosis not present

## 2018-10-16 DIAGNOSIS — F0150 Vascular dementia without behavioral disturbance: Secondary | ICD-10-CM

## 2018-10-16 DIAGNOSIS — J439 Emphysema, unspecified: Secondary | ICD-10-CM

## 2018-10-16 DIAGNOSIS — D509 Iron deficiency anemia, unspecified: Secondary | ICD-10-CM | POA: Diagnosis not present

## 2018-10-16 NOTE — Assessment & Plan Note (Signed)
Pleasantly confused without behavioral issues at this time.  As she is clinically stable; no change made in psychotropic agents.

## 2018-10-16 NOTE — Assessment & Plan Note (Signed)
BP controlled; no change in antihypertensive medications  

## 2018-10-16 NOTE — Assessment & Plan Note (Signed)
Mild low-grade basilar rales on exam without evidence of cardiopulmonary decompensation.  Pulmonary toilet as needed.

## 2018-10-16 NOTE — Assessment & Plan Note (Signed)
Clinically rhythm is regular 

## 2018-10-16 NOTE — Progress Notes (Signed)
NURSING HOME LOCATION:  [facility] ROOM NUMBER:  [id number]  CODE STATUS:  DNR  PCP:  Hendricks Limes, MD  [place] [address]   This is a nursing facility follow up of chronic medical diagnoses.  Interim medical record and care since last [facility] visit was updated with review of diagnostic studies and change in clinical status since last visit were documented.  HPI: She is a permanent resident of facility with diagnoses of PAF, hypertensive renal disease stage II, essential hypertension,  COPD, GERD with dysphagia, vascular dementia, and mixed iron and B12 deficiency anemias. Surgeries include cholecystectomy and right hip arthroplasty. Family history is noncontributory due to advanced age.  History states she has never smoked or drunk alcohol. Labs are not current.  In February she had mild anemia with hemoglobin 11.7/hematocrit 34.  B12 level was normal at that time.  Review of systems: Dementia invalidated responses. Date given as "89, 47??".  She could not name the president.  Her only concern was "where is my mother?". She denies any active symptoms, stating "I am okay"..  Constitutional: No fever, significant weight change, fatigue  Eyes: No redness, discharge, pain, vision change ENT/mouth: No nasal congestion,  purulent discharge, earache, change in hearing, sore throat  Cardiovascular: No chest pain, palpitations, paroxysmal nocturnal dyspnea, claudication, edema  Respiratory: No cough, sputum production, hemoptysis, DOE, significant snoring, apnea   Gastrointestinal: No heartburn, dysphagia, abdominal pain, nausea /vomiting, rectal bleeding, melena, change in bowels Genitourinary: No dysuria, hematuria, pyuria, incontinence, nocturia Musculoskeletal: No joint stiffness, joint swelling, weakness, pain Dermatologic: No rash, pruritus, change in appearance of skin Neurologic: No dizziness, headache, syncope, seizures, numbness, tingling  Psychiatric: No significant anxiety, depression, insomnia, anorexia Endocrine: No change in hair/skin/nails, excessive thirst, excessive hunger, excessive urination  Hematologic/lymphatic: No significant bruising, lymphadenopathy, abnormal bleeding Allergy/immunology: No itchy/watery eyes, significant sneezing, urticaria, angioedema  Physical exam:  Pertinent or positive findings: She is markedly hard of hearing.  Facies are blank.  Intermittent OS exotropia present.  She is edentulous but only wearing the upper plate.  She has low-grade rales at the bases.  Grade 1 systolic murmur is present.  Rhythm is clinically regular.  Pedal pulses are decreased.  Strength is fair to opposition, seemingly stronger in the upper extremities than the lower extremities.  She has marked mixed PIP/DIP arthritic changes.  She had difficulty following simple commands such as lifting her left hand.  General appearance: Adequately nourished; no acute distress, increased work of breathing is present.   Lymphatic: No lymphadenopathy about the head, neck, axilla. Eyes: No conjunctival inflammation or lid edema is present. There is no scleral icterus. Ears:  External ear exam shows no significant lesions or deformities.   Nose:  External nasal examination shows no deformity or inflammation. Nasal mucosa are pink and moist without lesions, exudates Oral exam:  Lips and gums are healthy appearing. There is no oropharyngeal erythema or exudate. Neck:  No thyromegaly, masses, tenderness noted.    Heart:  No gallop, murmur, click, rub .  Lungs: Chest clear to auscultation without wheezes, rhonchi, rubs. Abdomen: Bowel sounds are normal. Abdomen is soft and nontender with no organomegaly, hernias, masses. GU: Deferred  Extremities:  No cyanosis, clubbing, edema  Neurologic exam : Balance, Rhomberg, finger to nose testing could not be completed due to clinical state Skin: Warm & dry w/o tenting. No significant lesions or  rash.  See summary under each active problem in the Problem List with associated  updated therapeutic plan

## 2018-10-16 NOTE — Assessment & Plan Note (Addendum)
No bleeding dyscrasias reported.  Last hemoglobin hematocrit were in February with values of 11.7/34.  B12 level was normal at that time. Recheck post resolution of COVID-19 quarantine or emergently should any bleeding dyscrasias appear.

## 2018-10-17 NOTE — Patient Instructions (Signed)
See assessment and plan under each diagnosis in the problem list and acutely for this visit 

## 2018-12-11 LAB — HEPATIC FUNCTION PANEL
ALT: 10 (ref 7–35)
AST: 14 (ref 13–35)
Alkaline Phosphatase: 80 (ref 25–125)
Bilirubin, Total: 0.4

## 2018-12-11 LAB — COMPREHENSIVE METABOLIC PANEL
Albumin: 3.7 (ref 3.5–5.0)
Calcium: 9 (ref 8.7–10.7)
GFR calc Af Amer: 68.99
GFR calc non Af Amer: 59.52
Globulin: 2.6

## 2018-12-11 LAB — CBC AND DIFFERENTIAL
HCT: 40 (ref 36–46)
Hemoglobin: 13.6 (ref 12.0–16.0)
Neutrophils Absolute: 3
Platelets: 231 (ref 150–399)
WBC: 5.9

## 2018-12-11 LAB — BASIC METABOLIC PANEL
BUN: 31 — AB (ref 4–21)
CO2: 29 — AB (ref 13–22)
Chloride: 104 (ref 99–108)
Creatinine: 0.9 (ref 0.5–1.1)
Glucose: 96
Potassium: 3.9 (ref 3.4–5.3)
Sodium: 146 (ref 137–147)

## 2018-12-11 LAB — CBC: RBC: 4.46 (ref 3.87–5.11)

## 2019-01-15 ENCOUNTER — Non-Acute Institutional Stay (SKILLED_NURSING_FACILITY): Payer: Medicare Other | Admitting: Internal Medicine

## 2019-01-15 ENCOUNTER — Encounter: Payer: Self-pay | Admitting: Internal Medicine

## 2019-01-15 DIAGNOSIS — N182 Chronic kidney disease, stage 2 (mild): Secondary | ICD-10-CM

## 2019-01-15 DIAGNOSIS — F0150 Vascular dementia without behavioral disturbance: Secondary | ICD-10-CM

## 2019-01-15 DIAGNOSIS — I10 Essential (primary) hypertension: Secondary | ICD-10-CM | POA: Diagnosis not present

## 2019-01-15 DIAGNOSIS — I482 Chronic atrial fibrillation, unspecified: Secondary | ICD-10-CM

## 2019-01-15 DIAGNOSIS — D509 Iron deficiency anemia, unspecified: Secondary | ICD-10-CM | POA: Diagnosis not present

## 2019-01-15 NOTE — Assessment & Plan Note (Signed)
Anemia has resolved; on 12/11/2018 hemoglobin 13.6/hematocrit 40.

## 2019-01-15 NOTE — Assessment & Plan Note (Signed)
Rhythm is slightly irregular; rate is well controlled.  No change indicated.

## 2019-01-15 NOTE — Assessment & Plan Note (Addendum)
Dementia is profound.  Today she identified the baby doll as her son but could not provide his name.  Titration or change in present psychotropic medicines could lead to destabilization.

## 2019-01-15 NOTE — Progress Notes (Signed)
  NURSING HOME LOCATION:  [facility] ROOM NUMBER:  [id number]  CODE STATUS:  DNR  PCP:  Hendricks Limes, MD  [place] [address]  This is a nursing facility follow up of chronic medical diagnoses.  Interim medical record and care since last [facility] visit was updated with review of diagnostic studies and change in clinical status since last visit were documented.  HPI: She is a 85 year old permanent resident of facility with vascular dementia essential hypertension, GERD, COPD, CKD, and history of transient a flutter.  Review of systems: Dementia precluded completion of review of systems.  She is hard of hearing; she appears to deny any active symptoms.  Her history is totally unreliable.  She was holding a very realistic appearing toy doll.  When I asked who it  was she replied "my son".  She could not give me his name.  She stated she needed to "go home".  When I asked her where she lived,she could not tell me.   The patient typically mobilizes around the facility using her feet to propel the wheelchair.  She will try to enter other residents rooms as well as administrative offices but exhibits no violent behavior.  She remains on multiple psychotropic medications.  Physical exam:  Pertinent or positive findings: Decreased auditory acuity.  Mental status as noted.  She stares blankly at the examiner.  She has intermittent exotropia on the left.  She is wearing only the upper plate.  Heart rhythm is slightly irregular.  Breath sounds are decreased.  Pedal pulses are decreased.  She will not follow commands.  General appearance: Adequately nourished; no acute distress, increased work of breathing is present.   Lymphatic: No lymphadenopathy about the head, neck, axilla. Eyes: No conjunctival inflammation or lid edema is present. There is no scleral icterus. Ears:  External ear exam shows no significant lesions or deformities.   Nose:  External nasal examination  shows no deformity or inflammation. Nasal mucosa are pink and moist without lesions, exudates Neck:  No thyromegaly, masses, tenderness noted.    Heart:  No gallop, murmur, click, rub .  Lungs:  without wheezes, rhonchi, rales, rubs. Abdomen: Bowel sounds are normal. Abdomen is soft and nontender with no organomegaly, hernias, masses. GU: Deferred  Extremities:  No cyanosis, clubbing, edema  Neurologic exam : Strength equal  in upper & lower extremities & balance, Rhomberg, finger to nose testing could not be completed due to clinical state Skin: Warm & dry w/o tenting. No significant lesions or rash.  See summary under each active problem in the Problem List with associated updated therapeutic plan

## 2019-01-15 NOTE — Patient Instructions (Signed)
See assessment and plan under each diagnosis in the problem list and acutely for this visit 

## 2019-01-15 NOTE — Assessment & Plan Note (Signed)
BP controlled; no change in antihypertensive medications  

## 2019-01-15 NOTE — Assessment & Plan Note (Signed)
12/11/2018 creatinine is 0.9 and BUN 31 with a GFR of 59.6 indicating minimal CKD stage II.  Her creatinine may actually reflect slight protein malnutrition with loss of muscle mass.

## 2019-01-20 IMAGING — RF DG SWALLOWING FUNCTION
12 of 19 series · 12 of 24 positions shown · non-contrast
Comparison: None.

CLINICAL DATA: Coughing after meals with increased secretions

EXAM:
MODIFIED BARIUM SWALLOW
TECHNIQUE: Different consistencies of barium were administered orally to the
patient by the Speech Pathologist. Imaging of the pharynx was
performed in the lateral projection.
FLUOROSCOPY TIME:  Fluoroscopy Time:  1 minutes, 23 seconds
Radiation Exposure Index (if provided by the fluoroscopic device):
N/A
Number of Acquired Spot Images: 0

[Series 1: run · 1 of 37 frames shown (1 of 12)]
[frame 32/37]
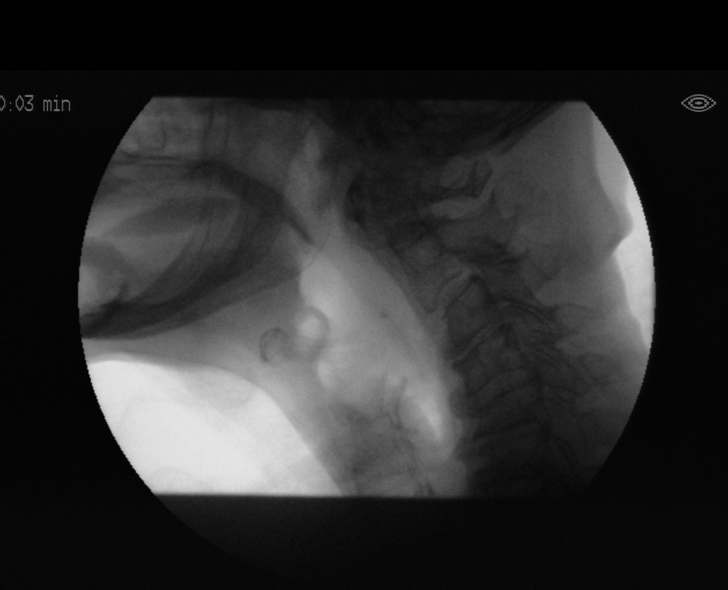

[Series 3: run · 1 of 233 frames shown (2 of 12)]
[frame 199/233]
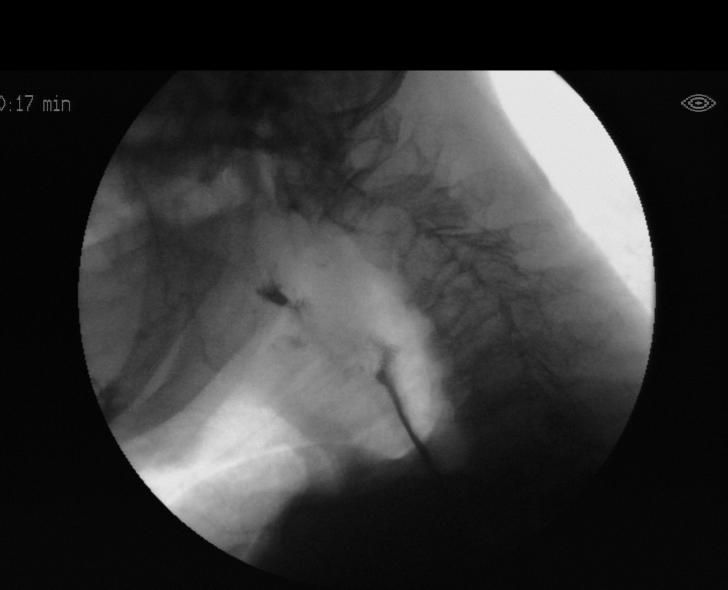

[Series 5: run · 1 of 265 frames shown (3 of 12)]
[frame 40/265]
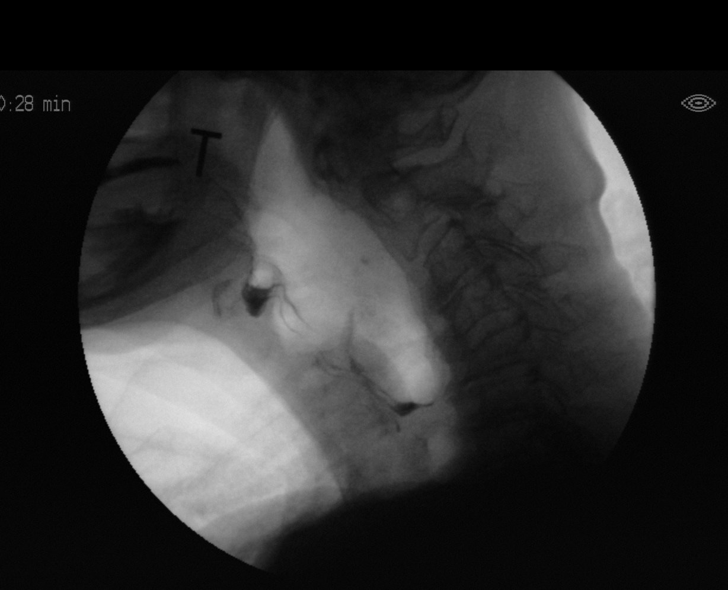

[Series 6: run · 1 of 24 frames shown (4 of 12)]
[frame 21/24]
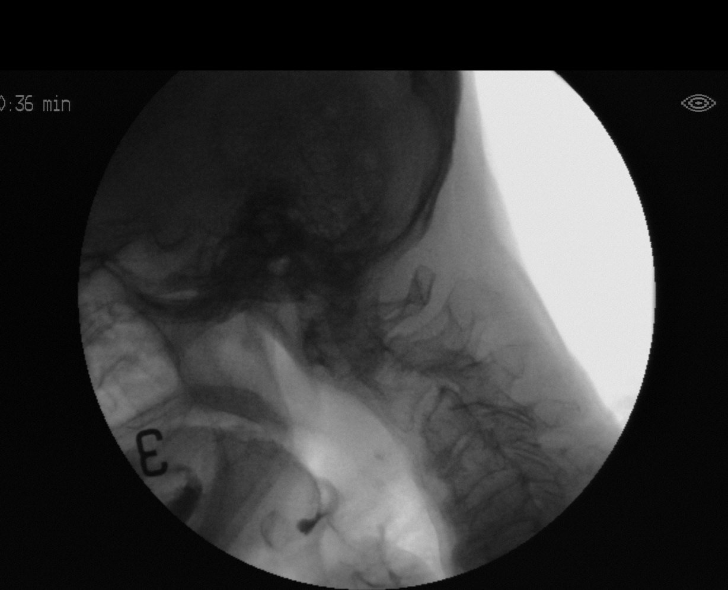

[Series 8: run · 1 of 68 frames shown (5 of 12)]
[frame 35/68]
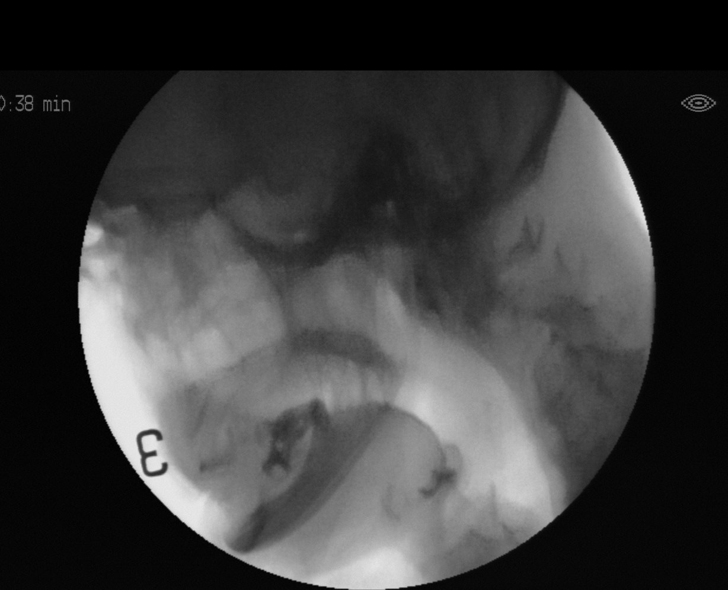

[Series 9: run · 1 of 42 frames shown (6 of 12)]
[frame 40/42]
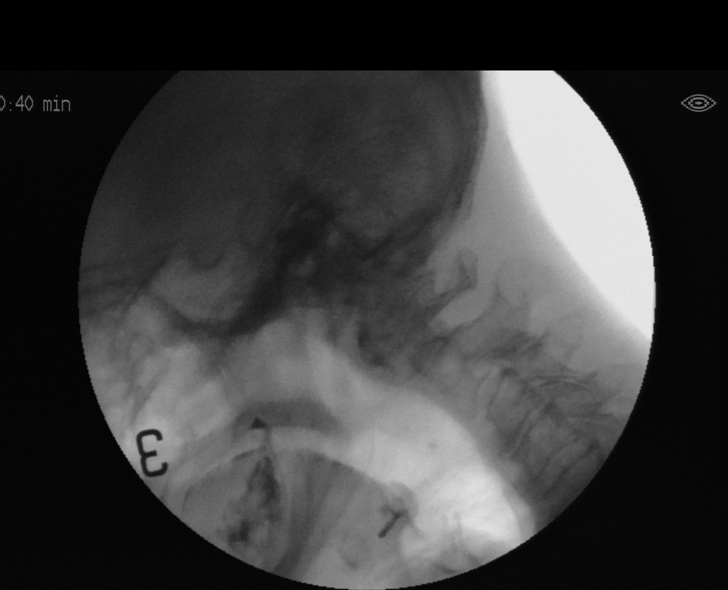

[Series 11: run · 1 of 14 frames shown (7 of 12)]
[frame 12/14]
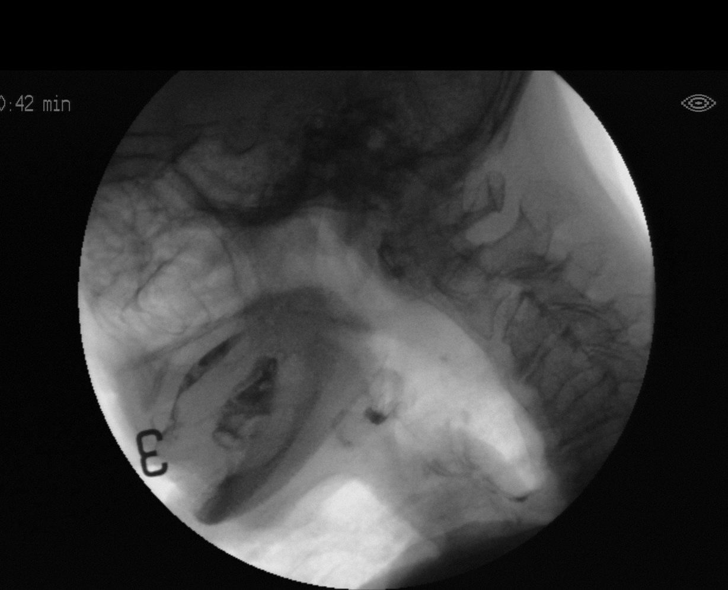

[Series 13: run · 1 of 85 frames shown (8 of 12)]
[frame 13/85]
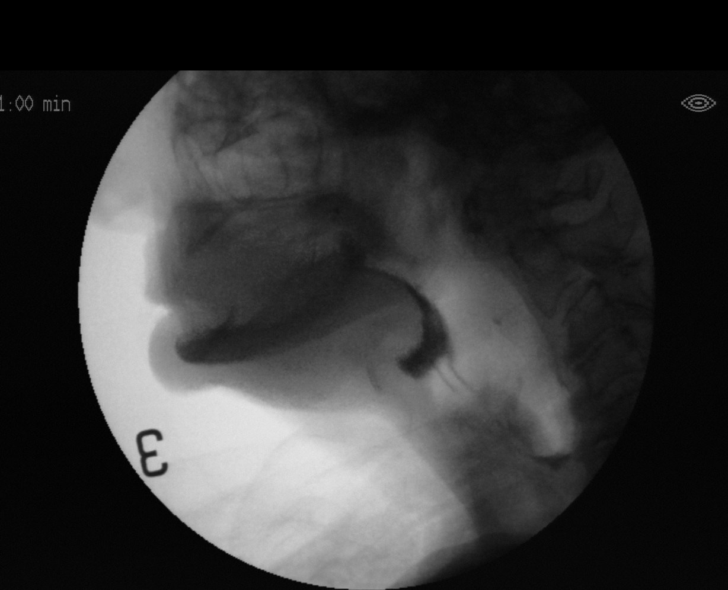

[Series 15: run · 1 of 130 frames shown (9 of 12)]
[frame 20/130]
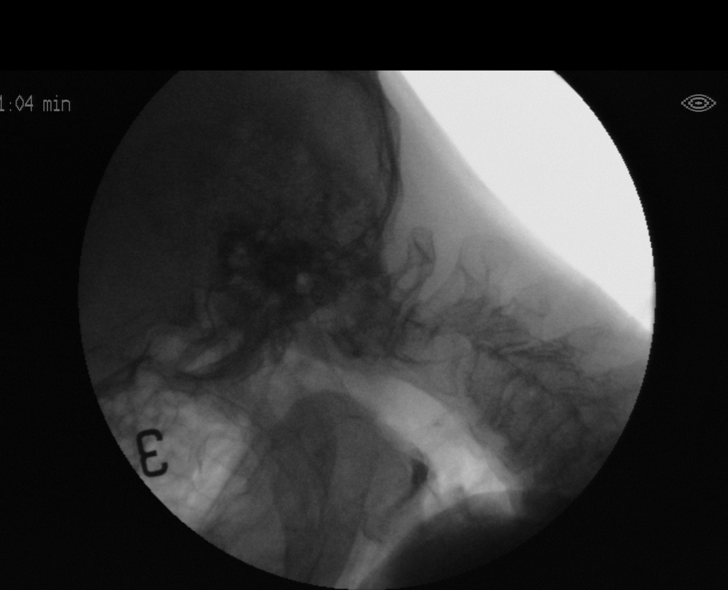

[Series 16: run · 1 of 9 frames shown (10 of 12)]
[frame 7/9]
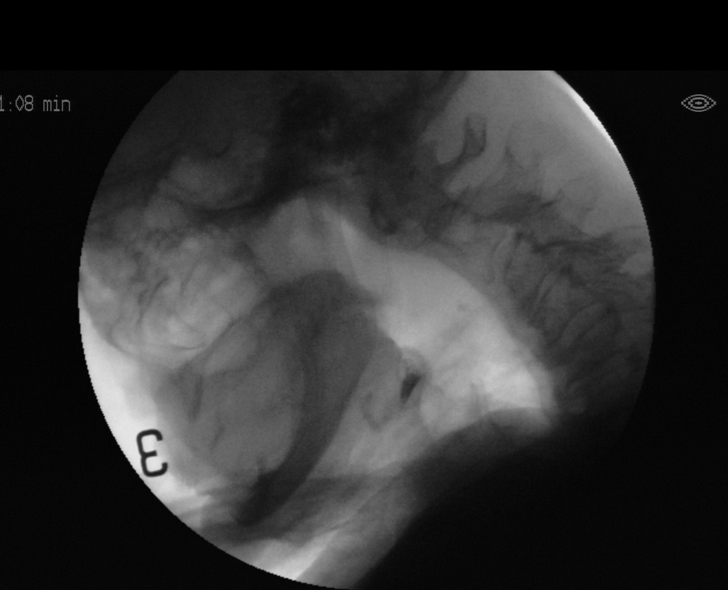

[Series 18: run · 1 of 58 frames shown (11 of 12)]
[frame 30/58]
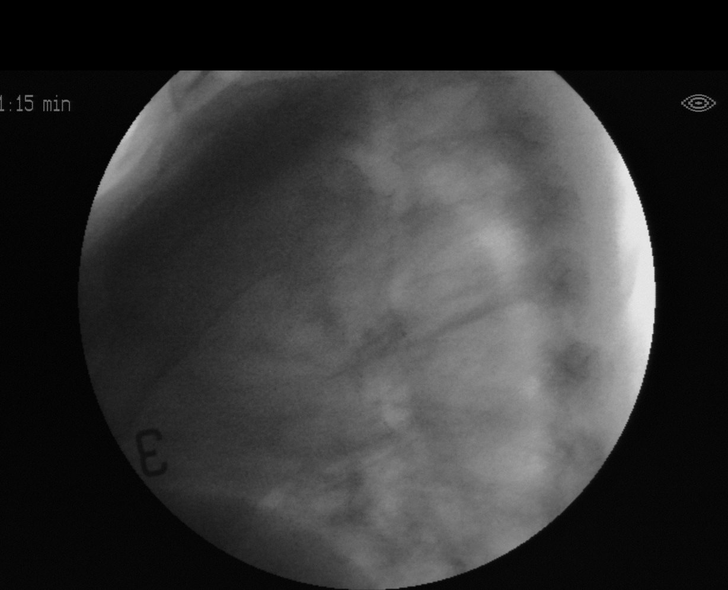

[Series 19: run · 1 of 221 frames shown (12 of 12)]
[frame 188/221]
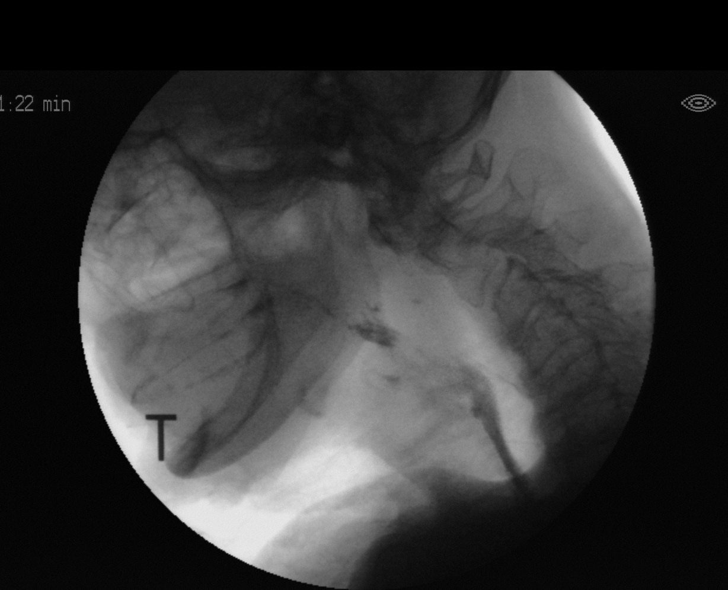

[12 of 24 positions shown; findings below may reference images not displayed]

FINDINGS: Thin liquid: Mild delayed swallowing trigger. Otherwise
unremarkable.

Barium coated Chia cracker: Delayed swallowing trigger.
IMPRESSION: 1. Delayed swallowing trigger.  Otherwise unremarkable exam.

Please refer to the Speech Pathologists report for complete details
and recommendations.

## 2019-04-04 ENCOUNTER — Encounter: Payer: Self-pay | Admitting: Internal Medicine

## 2019-04-04 ENCOUNTER — Non-Acute Institutional Stay (SKILLED_NURSING_FACILITY): Payer: Medicare Other | Admitting: Internal Medicine

## 2019-04-04 DIAGNOSIS — F0150 Vascular dementia without behavioral disturbance: Secondary | ICD-10-CM | POA: Diagnosis not present

## 2019-04-04 DIAGNOSIS — J9611 Chronic respiratory failure with hypoxia: Secondary | ICD-10-CM

## 2019-04-04 DIAGNOSIS — J439 Emphysema, unspecified: Secondary | ICD-10-CM | POA: Diagnosis not present

## 2019-04-04 DIAGNOSIS — I482 Chronic atrial fibrillation, unspecified: Secondary | ICD-10-CM | POA: Diagnosis not present

## 2019-04-04 DIAGNOSIS — F330 Major depressive disorder, recurrent, mild: Secondary | ICD-10-CM

## 2019-04-04 NOTE — Patient Instructions (Signed)
See assessment and plan under each diagnosis in the problem list and acutely for this visit 

## 2019-04-04 NOTE — Assessment & Plan Note (Addendum)
She is severely demented and exhibits hallucinations that imaginary people in the room with whom she can converse.  She also perseverates on certain items such as having lost her coat today.  She is not having significant behavioral issues which warrant changing medication.  Clinically there is no indication for antipsychotic therapy.

## 2019-04-04 NOTE — Assessment & Plan Note (Signed)
She exhibits no increased work of breathing.  Clinically her pulmonary status is stable.

## 2019-04-04 NOTE — Progress Notes (Signed)
   NURSING HOME LOCATION:  [facility] ROOM NUMBER:  [id number]  CODE STATUS:  DNR  PCP:  Pecola Lawless, MD  [address] [place] [address]  This is a nursing facility follow up of chronic medical diagnoses.   Interim medical record and care since last [facility] visit was updated with review of diagnostic studies and change in clinical status since last visit were documented.  HPI: She is a permanent resident facility with diagnoses of GERD, essential hypertension, COPD, CKD, and PAF/flutter. Surgeries include cholecystectomy and hip arthroplasty.  She had labs in November 2020 which revealed no significant abnormalities.  There was mild renal insufficiency with GFR of 59.52.  Mild prerenal azotemia was also present.  Review of systems: Dementia prevented completion of review of systems. The patient typically will ambulate around the facility in a wheelchair using her feet to propel her.  She will attempt to enter rooms but does not become agitated when redirected. She kept repeating the question "have you found my coat?".  Her roommate validates this is a recurrent picture.  She also has been noted to have conversations with imaginary people.  Physical exam:  Pertinent or positive findings: She is markedly hard of hearing.  She is wearing only the upper plate.  Clinically rhythm is regular with no evidence of dysrhythmia.  Pedal pulses are decreased.  She has an antiwandering bracelet over the right ankle.  She has marked PIP and DIP arthritic change in the hands.  Strength to opposition could not be assessed as she would not follow commands.  General appearance: Adequately nourished; no acute distress, increased work of breathing is present.   Lymphatic: No lymphadenopathy about the head, neck, axilla. Eyes: No conjunctival inflammation or lid edema is present. There is no scleral icterus. Ears:  External ear exam shows no significant lesions or deformities.   Nose:   External nasal examination shows no deformity or inflammation. Nasal mucosa are pink and moist without lesions, exudates Oral exam:  Lips and gums are healthy appearing. There is no oropharyngeal erythema or exudate. Neck:  No thyromegaly, masses, tenderness noted.    Heart:  No gallop, murmur, click, rub .  Lungs:  without wheezes, rhonchi, rales, rubs. Abdomen: Bowel sounds are normal. Abdomen is soft and nontender with no organomegaly, hernias, masses. GU: Deferred  Extremities:  No cyanosis, clubbing, edema  Neurologic exam : Balance, Rhomberg, finger to nose testing could not be completed due to clinical state Skin: Warm & dry w/o tenting. No significant lesions or rash.  See summary under each active problem in the Problem List with associated updated therapeutic plan

## 2019-04-04 NOTE — Assessment & Plan Note (Signed)
Clinically there is no significant change in her dementia or behavioral pattern

## 2019-04-04 NOTE — Assessment & Plan Note (Addendum)
No hypoxia reported and she exhibits no respiratory compromise.

## 2019-04-04 NOTE — Assessment & Plan Note (Signed)
Clinically rhythm is regular 

## 2019-07-11 ENCOUNTER — Encounter: Payer: Self-pay | Admitting: Internal Medicine

## 2019-07-11 ENCOUNTER — Non-Acute Institutional Stay (SKILLED_NURSING_FACILITY): Payer: Medicare Other | Admitting: Internal Medicine

## 2019-07-11 DIAGNOSIS — I10 Essential (primary) hypertension: Secondary | ICD-10-CM

## 2019-07-11 DIAGNOSIS — F0150 Vascular dementia without behavioral disturbance: Secondary | ICD-10-CM

## 2019-07-11 NOTE — Assessment & Plan Note (Addendum)
She does propel herself around the facility in her wheelchair with her feet and will attempt to go and in any unlocked room.

## 2019-07-11 NOTE — Assessment & Plan Note (Signed)
BP controlled; no change in antihypertensive medications  

## 2019-07-11 NOTE — Progress Notes (Signed)
   NURSING HOME LOCATION:  [facility] ROOM NUMBER:  221/B  CODE STATUS:  DNR  PCP:  Marga Melnick MD   This is a nursing facility follow up of chronic medical diagnoses  Interim medical record and care since last [facility] visit was updated with review of diagnostic studies and change in clinical status since last visit were documented.  HPI: She is a permanent resident facility with medical diagnoses of essential hypertension, iron deficiency anemia, extrinsic rhinitis, vascular dementia, GERD, PAF, CKD, and COPD. Labs have not been updated since November 2020; there were no significant abnormalities at that time.  Review of systems: Dementia prevented any meaningful history.  She can answer no questions.  She would reply "yes" when asked to perform a task and then be unable to follow commands.  Physical exam:  Pertinent or positive findings: It was after 4:30 pm and she was in bed.  When asked why ;she replied "cold".  She stares blankly with no facial expression.  She is wearing only the upper denture.  Basilar systolic murmur is suggested.  Breath sounds are decreased.  Pedal pulses decreased.  She has marked PIP and DIP arthritic changes of the hands.  Strength appears to be fair but as stated she did not follow commands well.  Antiwandering bracelet present at the ankle.  General appearance:  no acute distress, increased work of breathing is present.   Lymphatic: No lymphadenopathy about the head, neck, axilla. Eyes: No conjunctival inflammation or lid edema is present. There is no scleral icterus. Ears:  External ear exam shows no significant lesions or deformities.   Nose:  External nasal examination shows no deformity or inflammation. Nasal mucosa are pink and moist without lesions, exudates Oral exam:   There is no oropharyngeal erythema or exudate. Neck:  No thyromegaly, masses, tenderness noted.    Heart:  Normal rate and regular rhythm. S1 and S2 normal  without gallop, click, rub .  Lungs:  without wheezes, rhonchi, rales, rubs. Abdomen: Bowel sounds are normal. Abdomen is soft and nontender with no organomegaly, hernias, masses. GU: Deferred  Extremities:  No cyanosis, clubbing, edema  Neurologic exam : Balance, Rhomberg, finger to nose testing could not be completed due to clinical state Skin: Warm & dry w/o tenting. No significant lesions or rash.  See summary under each active problem in the Problem List with associated updated therapeutic plan

## 2019-07-11 NOTE — Patient Instructions (Signed)
See assessment and plan under each diagnosis in the problem list and acutely for this visit 

## 2019-09-25 LAB — HEPATIC FUNCTION PANEL
ALT: 8 (ref 7–35)
AST: 15 (ref 13–35)
Alkaline Phosphatase: 87 (ref 25–125)
Bilirubin, Total: 0.4

## 2019-09-25 LAB — TSH: TSH: 2.15 (ref 0.41–5.90)

## 2019-09-25 LAB — CBC AND DIFFERENTIAL
HCT: 40 (ref 36–46)
Hemoglobin: 13.1 (ref 12.0–16.0)
Neutrophils Absolute: 3.6
Platelets: 244 (ref 150–399)
WBC: 5.9

## 2019-09-25 LAB — BASIC METABOLIC PANEL
BUN: 17 (ref 4–21)
CO2: 26 — AB (ref 13–22)
Chloride: 105 (ref 99–108)
Creatinine: 0.6 (ref 0.5–1.1)
Glucose: 117
Potassium: 3.5 (ref 3.4–5.3)
Sodium: 144 (ref 137–147)

## 2019-09-25 LAB — COMPREHENSIVE METABOLIC PANEL
Albumin: 3.5 (ref 3.5–5.0)
Calcium: 9.4 (ref 8.7–10.7)
GFR calc Af Amer: 90
GFR calc non Af Amer: 82.19
Globulin: 2.8

## 2019-09-25 LAB — IRON,TIBC AND FERRITIN PANEL
%SAT: 38.33
Ferritin: 1076
Iron: 84
TIBC: 219
UIBC: 135

## 2019-09-25 LAB — VITAMIN D 25 HYDROXY (VIT D DEFICIENCY, FRACTURES): Vit D, 25-Hydroxy: 39.7

## 2019-09-25 LAB — CBC: RBC: 4.39 (ref 3.87–5.11)

## 2019-10-03 ENCOUNTER — Non-Acute Institutional Stay (SKILLED_NURSING_FACILITY): Payer: Medicare Other | Admitting: Internal Medicine

## 2019-10-03 ENCOUNTER — Encounter: Payer: Self-pay | Admitting: Internal Medicine

## 2019-10-03 DIAGNOSIS — R067 Sneezing: Secondary | ICD-10-CM | POA: Insufficient documentation

## 2019-10-03 DIAGNOSIS — R627 Adult failure to thrive: Secondary | ICD-10-CM | POA: Diagnosis not present

## 2019-10-03 DIAGNOSIS — F0150 Vascular dementia without behavioral disturbance: Secondary | ICD-10-CM | POA: Diagnosis not present

## 2019-10-03 NOTE — Assessment & Plan Note (Signed)
Dementia is stable with no significant behavioral issues.  Antiwandering bracelet present at the right ankle.

## 2019-10-03 NOTE — Assessment & Plan Note (Signed)
[facility] NP wrote an order for supervised meal intake to guarantee adequate nutrition.

## 2019-10-03 NOTE — Assessment & Plan Note (Signed)
Claritin 10 mg daily will be ordered if this persists.  Sedating antihistamines will be avoided because of increased risk of falling.

## 2019-10-03 NOTE — Patient Instructions (Signed)
See assessment and plan under each diagnosis in the problem list and acutely for this visit 

## 2019-10-03 NOTE — Progress Notes (Signed)
   NURSING HOME LOCATION:  [facility] ROOM NUMBER:  [id number]  CODE STATUS:  DNR  PCP:  Pecola Lawless, MD  [address] [place] [address]  This is a nursing facility follow up of chronic medical diagnoses.   Interim medical record and care since last [facility] visit was updated with review of diagnostic studies and change in clinical status since last visit were documented.  HPI: She is a permanent resident of this facility with medical diagnoses of essential hypertension, GERD, COPD, PAF,dementia, and CKD. [facility] NP reports that she is declining manifested as weight loss.Labs are not current in Epic, but apparently labs were done last week and no significant abnormalities documented. Speech Therapy thought the patient was snacking throughout the day and skipping meals.  Order was written for aide to help with meals to guarantee adequate intake.  Review of systems: Dementia invalidated responses.  All responses were simply a "no".  When asked how she was doing she replied "all right".  Although she sneezed several times she denied any extrinsic symptoms or active pulmonary symptoms.  Constitutional: No fever, significant weight change, fatigue  Eyes: No redness, discharge, pain, vision change ENT/mouth: No nasal congestion,  purulent discharge, earache, change in hearing, sore throat  Cardiovascular: No chest pain, palpitations, paroxysmal nocturnal dyspnea, claudication, edema  Respiratory: No cough, sputum production, hemoptysis, significant snoring, apnea   Gastrointestinal: No heartburn, dysphagia, abdominal pain, nausea /vomiting, rectal bleeding, melena, change in bowels Genitourinary: No dysuria, hematuria, pyuria, incontinence, nocturia Musculoskeletal: No joint stiffness, joint swelling, weakness, pain Dermatologic: No rash, pruritus, change in appearance of skin Neurologic: No dizziness, headache, syncope, seizures, numbness, tingling Psychiatric: No  significant anxiety, depression, insomnia, anorexia Endocrine: No change in hair/skin/nails, excessive thirst, excessive hunger, excessive urination  Hematologic/lymphatic: No significant bruising, lymphadenopathy, abnormal bleeding Allergy/immunology: No itchy/watery eyes, urticaria, angioedema  Physical exam:  Pertinent or positive findings: Facies are blank.  There is a small boss with faint ecchymoses over the left forehead.  This was nontender to palpation.  She is wearing only upper plate.  A grade 1/2-1 systolic murmur is present at the base.  Dorsalis pedis pulses are stronger than the posterior tibial pulses.  She is weak to opposition in all extremities.  She has PIP arthritic changes of the hands.  Antiwandering bracelet present at the right ankle.  General appearance: Adequately nourished; no acute distress, increased work of breathing is present.   Lymphatic: No lymphadenopathy about the head, neck, axilla. Eyes: No conjunctival inflammation or lid edema is present. There is no scleral icterus. Ears:  External ear exam shows no significant lesions or deformities.   Nose:  External nasal examination shows no deformity or inflammation. Nasal mucosa are pink and moist without lesions, exudates Oral exam:  Lips and gums are healthy appearing. There is no oropharyngeal erythema or exudate. Neck:  No thyromegaly, masses, tenderness noted.    Heart:  Normal rate and regular rhythm. S1 and S2 normal without gallop, click, rub .  Lungs: Chest clear to auscultation without wheezes, rhonchi, rales, rubs. Abdomen: Bowel sounds are normal. Abdomen is soft and nontender with no organomegaly, hernias, masses. GU: Deferred  Extremities:  No cyanosis, clubbing, edema  Neurologic exam :Balance, Rhomberg, finger to nose testing could not be completed due to clinical state Skin: Warm & dry w/o tenting. No significant lesions or rash.  See summary under each active problem in the Problem List with  associated updated therapeutic plan

## 2019-12-28 ENCOUNTER — Encounter: Payer: Self-pay | Admitting: Internal Medicine

## 2019-12-28 NOTE — Progress Notes (Signed)
I'd been called about her room mate coughing and when rapid covid tests done, she also tested positive. Coughing also and been in bed with headache but no fever.  Moving to isolation unit.  Discussed further tx if declines with steroids, albuterol etc and possibly monoclonal antibody.

## 2019-12-31 LAB — CBC AND DIFFERENTIAL
HCT: 47 — AB (ref 36–46)
Hemoglobin: 15.8 (ref 12.0–16.0)
Neutrophils Absolute: 12.3
Platelets: 274 (ref 150–399)
WBC: 13.9

## 2019-12-31 LAB — BASIC METABOLIC PANEL
BUN: 48 — AB (ref 4–21)
CO2: 27 — AB (ref 13–22)
Chloride: 104 (ref 99–108)
Creatinine: 1.2 — AB (ref 0.5–1.1)
Glucose: 148
Potassium: 4.1 (ref 3.4–5.3)
Sodium: 149 — AB (ref 137–147)

## 2019-12-31 LAB — COMPREHENSIVE METABOLIC PANEL
Calcium: 9.1 (ref 8.7–10.7)
GFR calc Af Amer: 47.69
GFR calc non Af Amer: 41.15

## 2019-12-31 LAB — CBC: RBC: 5.29 — AB (ref 3.87–5.11)

## 2020-01-04 LAB — BASIC METABOLIC PANEL
BUN: 32 — AB (ref 4–21)
CO2: 27 — AB (ref 13–22)
Chloride: 108 (ref 99–108)
Creatinine: 0.7 (ref 0.5–1.1)
Glucose: 110
Potassium: 3.7 (ref 3.4–5.3)
Sodium: 145 (ref 137–147)

## 2020-01-04 LAB — CBC AND DIFFERENTIAL
HCT: 36 (ref 36–46)
Hemoglobin: 12.3 (ref 12.0–16.0)
Neutrophils Absolute: 7.4
Platelets: 329 (ref 150–399)
WBC: 9.1

## 2020-01-04 LAB — COMPREHENSIVE METABOLIC PANEL
Calcium: 8.5 — AB (ref 8.7–10.7)
GFR calc Af Amer: 89.54
GFR calc non Af Amer: 77.25

## 2020-01-04 LAB — CBC: RBC: 4.1 (ref 3.87–5.11)

## 2020-01-07 LAB — CBC AND DIFFERENTIAL
HCT: 39 (ref 36–46)
HCT: 39 (ref 36–46)
Hemoglobin: 12.6 (ref 12.0–16.0)
Hemoglobin: 12.6 (ref 12.0–16.0)
Neutrophils Absolute: 9.5
Platelets: 377 (ref 150–399)
WBC: 11.8

## 2020-01-07 LAB — COMPREHENSIVE METABOLIC PANEL
Calcium: 9.2 (ref 8.7–10.7)
GFR calc Af Amer: 90
GFR calc non Af Amer: 82.52

## 2020-01-07 LAB — CBC: RBC: 4.41 (ref 3.87–5.11)

## 2020-01-07 LAB — BASIC METABOLIC PANEL
BUN: 33 — AB (ref 4–21)
CO2: 28 — AB (ref 13–22)
Chloride: 105 (ref 99–108)
Creatinine: 0.5 (ref 0.5–1.1)
Glucose: 111
Potassium: 5.2 (ref 3.4–5.3)
Sodium: 142 (ref 137–147)

## 2020-01-09 ENCOUNTER — Encounter: Payer: Self-pay | Admitting: Internal Medicine

## 2020-01-09 ENCOUNTER — Non-Acute Institutional Stay (SKILLED_NURSING_FACILITY): Payer: Medicare Other | Admitting: Internal Medicine

## 2020-01-09 DIAGNOSIS — I482 Chronic atrial fibrillation, unspecified: Secondary | ICD-10-CM

## 2020-01-09 DIAGNOSIS — J439 Emphysema, unspecified: Secondary | ICD-10-CM

## 2020-01-09 DIAGNOSIS — U071 COVID-19: Secondary | ICD-10-CM | POA: Diagnosis not present

## 2020-01-09 DIAGNOSIS — R627 Adult failure to thrive: Secondary | ICD-10-CM | POA: Diagnosis not present

## 2020-01-09 NOTE — Assessment & Plan Note (Signed)
Because of coarse rhonchi obscuring heart sounds; rhythm cannot be determined definitively.

## 2020-01-09 NOTE — Assessment & Plan Note (Addendum)
On exam she has diffuse coarse rhonchi.  Continuation of steroids until the bronchospasm is control will be discussed with [facility] NP.

## 2020-01-09 NOTE — Patient Instructions (Signed)
See assessment and plan under each diagnosis in the problem list and acutely for this visit 

## 2020-01-09 NOTE — Assessment & Plan Note (Addendum)
Covid 19 infection associated with profound anorexia with decreased intake of nutrition and fluids as well as decreased activity.Oral steroid burst may result in symptomatic improvement.

## 2020-01-09 NOTE — Progress Notes (Signed)
   NURSING HOME LOCATION:  [facility] ROOM NUMBER:  [id number]  CODE STATUS:  DNR  PCP:  Pecola Lawless, MD  [address] [place] [address]  This is a nursing facility follow up of chronic medical diagnoses.   Interim medical record and care since last [facility] visit was updated with review of diagnostic studies and change in clinical status since last visit were documented.  HPI: She recently tested positive for COVID-19 and received monoclonal antibodies.  The acute infection was complicated by anorexia with profoundly decreased nutritional intake as well as marked decrease in her activities.  Typically she stays in a wheelchair propelling herself around the facility with her feet.  Although chest x-ray 12/2 revealed no active disease she had coarse rhonchi on exam and white count was 16,000.  She initially received Rocephin but this was transitioned to doxycycline.  Her chest x-ray does show chronically elevated right hemidiaphragm which is unchanged based on serial films.  Review of systems: She is profoundly demented and essentially nonverbal.  She will nod her head to acknowledge she heard the question. Speech therapy validates that her intake has improved dramatically.  She consumed 60% of her breakfast.  Also a significant change is the fact she is drinking fluids on her own.  Physical exam:  Pertinent or positive findings: She appears acutely on chronically ill despite staff reporting clinical improvement compared to last week.  Hair is disheveled.  She is edentulous.  She has coarse rhonchi in all lung fields which obscure the heart sounds.  Limbs are atrophic.  Pedal pulses are decreased.  She has osteoarthritic changes of the hands.Some tenting of skin present.  General appearance: no acute distress, increased work of breathing is present.   Lymphatic: No lymphadenopathy about the head, neck, axilla. Eyes: No conjunctival inflammation or lid edema is present.  There is no scleral icterus. Ears:  External ear exam shows no significant lesions or deformities.   Nose:  External nasal examination shows no deformity or inflammation. Nasal mucosa are pink and moist without lesions, exudates. Neck:  No thyromegaly, masses, tenderness noted.    Lungs:  without wheezes,  rales, rubs. Abdomen: Bowel sounds are normal. Abdomen is soft and nontender with no organomegaly, hernias, masses. GU: Deferred  Extremities:  No cyanosis, clubbing, edema  Neurologic exam :Balance, Rhomberg, finger to nose testing could not be completed due to clinical state Skin: Warm & dry. No significant lesions or rash.  See summary under each active problem in the Problem List with associated updated therapeutic plan

## 2020-03-03 DEATH — deceased
